# Patient Record
Sex: Female | Born: 1995 | Hispanic: Yes | Smoking: Never smoker
Health system: Southern US, Community
[De-identification: ages and names within clinical notes are randomized; demographics above are authoritative.]

## PROBLEM LIST (undated history)

## (undated) ENCOUNTER — Inpatient Hospital Stay (HOSPITAL_COMMUNITY): Payer: Self-pay

## (undated) DIAGNOSIS — Z789 Other specified health status: Secondary | ICD-10-CM

## (undated) DIAGNOSIS — O34219 Maternal care for unspecified type scar from previous cesarean delivery: Secondary | ICD-10-CM

## (undated) DIAGNOSIS — D649 Anemia, unspecified: Secondary | ICD-10-CM

## (undated) DIAGNOSIS — B1910 Unspecified viral hepatitis B without hepatic coma: Secondary | ICD-10-CM

## (undated) DIAGNOSIS — K760 Fatty (change of) liver, not elsewhere classified: Secondary | ICD-10-CM

## (undated) HISTORY — DX: Maternal care for unspecified type scar from previous cesarean delivery: O34.219

## (undated) HISTORY — PX: ABDOMINAL SURGERY: SHX537

## (undated) HISTORY — DX: Anemia, unspecified: D64.9

---
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
## 2015-01-27 ENCOUNTER — Ambulatory Visit (INDEPENDENT_AMBULATORY_CARE_PROVIDER_SITE_OTHER): Payer: Self-pay | Admitting: Family Medicine

## 2015-01-27 ENCOUNTER — Encounter: Payer: Self-pay | Admitting: Family Medicine

## 2015-01-27 VITALS — BP 121/74 | HR 84 | Temp 98.1°F | Wt 186.5 lb

## 2015-01-27 DIAGNOSIS — K760 Fatty (change of) liver, not elsewhere classified: Secondary | ICD-10-CM

## 2015-01-27 NOTE — Progress Notes (Signed)
Patient ID: Sharon Hess, female   DOB: [birth year], [age] y.o.   MRN: [id number]   [facility] Yolande Jollyaleb G Aava Deland, MD Phone: [phone]  Subjective:   # Fatty Liver follow Up  - pt. States she is here today for follow up after her visit with Dr. Birdie SonsSonnenberg in June of last year to discuss a finding on ultrasound demonstrating fatty liver disease.  - she had been started on a NASH diet at that time.  - She says she has been compliant. She has lost 5 lbs since her last visit.  - She says that she has not been exercising, and that she continues to eat junk food and fast food.  - She drinks lots of carbonated beverages.  - She denies RUQ pain, nausea, vomiting, or periprandial pain / discomfort.  - she denies jaundice, diarrhea, or constipation.  - she does desire to continue to lose weight.  - she would like to continue to work on her diet and exercise.  - She does not have heat or cold intolerance, hair changes, fatigue, or feel that her thyroid is enlarged.  - She does not feel that she is increasing the amount that she has been eating.   All relevant systems were reviewed and were negative unless otherwise noted in the HPI  Past Medical History Reviewed problem list.  Medications- reviewed and updated Current Outpatient Prescriptions  Medication Sig Dispense Refill  . hydrocortisone valerate ointment (WESTCORT) 0.2 % Apply 1 application topically 2 (two) times daily. For 5 days to rash on arms. (Patient not taking: Reported on 12/01/2014) 45 g 0  . ibuprofen (ADVIL,MOTRIN) 600 MG tablet Take 1 tablet (600 mg total) by mouth every 8 (eight) hours as needed. 30 tablet 0  . Prenatal Vit-Fe Fumarate-FA (PRENATAL MULTIVITAMIN) TABS tablet Take 1 tablet by mouth daily. (Patient not taking: Reported on 04/11/2014) 90 tablet 2   No current facility-administered medications for this visit.   Chief complaint-noted No additions to family history Social history-  patient is a non smoker  Objective: BP 121/74 mmHg  Pulse 84  Temp(Src) 98.1 F (36.7 C) (Oral)  Wt 186 lb 8 oz (84.596 kg) Gen: NAD, alert, cooperative with exam HEENT: NCAT, EOMI, PERRL Neck: FROM, supple CV: RRR, good S1/S2, no murmur Resp: CTABL, no wheezes, non-labored Abd: SNTND, BS present, no guarding or organomegaly, no masses palpable, obese abdomen.  Ext: No edema, warm, normal tone, moves UE/LE spontaneously Neuro: Alert and oriented, No gross deficits Skin: no rashes no lesions  Assessment/Plan:  Fatty Liver - LFT's recently cleared. She has lost some weight. It is unclear how significant this is in the setting of pregnancy immediately prior to the liver evaluation. She is obese, but given her age I suspect that her steatosis will improve. She does need to lose weight. She acknowledges this and appears motivated.  - Mostly a counseling visit. - given diet and exercise recommendations.  - return in 6 months for weight evaluation.  - Consider repeat of ultrasound to evaluate the liver in the future.  - discussed at length with the patient.

## 2015-01-27 NOTE — Patient Instructions (Signed)
Thanks for coming in today.   See the diet and exercise recommendations below.   It is very important that you continue to work on losing weight.   Thanks for letting us take care of you.   Sincerely, Sharon Pacealeb Virdie Penning, MD Family Medicine - PGY 2  American Heart Association [facility](AHA) Exercise Recommendation  Being physically active is important to prevent heart disease and stroke, the nation's No. 1and No. 5killers. To improve overall cardiovascular health, we suggest at least 150 minutes per week of moderate exercise or 75 minutes per week of vigorous exercise (or a combination of moderate and vigorous activity). Thirty minutes a day, five times a week is an easy goal to remember. You will also experience benefits even if you divide your time into two or three segments of 10 to 15 minutes per day.  For people who would benefit from lowering their blood pressure or cholesterol, we recommend 40 minutes of aerobic exercise of moderate to vigorous intensity three to four times a week to lower the risk for heart attack and stroke.  Physical activity is anything that makes you move your body and burn calories.  This includes things like climbing stairs or playing sports. Aerobic exercises benefit your heart, and include walking, jogging, swimming or biking. Strength and stretching exercises are best for overall stamina and flexibility.  The simplest, positive change you can make to effectively improve your heart health is to start walking. It's enjoyable, free, easy, social and great exercise. A walking program is flexible and boasts high success rates because people can stick with it. It's easy for walking to become a regular and satisfying part of life.   For Overall Cardiovascular Health:  At least 30 minutes of moderate-intensity aerobic activity at least 5 days per week for a total of 150  OR   At least 25 minutes of vigorous aerobic activity at least 3 days per week for a total of 75  minutes; or a combination of moderate- and vigorous-intensity aerobic activity  AND   Moderate- to high-intensity muscle-strengthening activity at least 2 days per week for additional health benefits.  For Lowering Blood Pressure and Cholesterol  An average 40 minutes of moderate- to vigorous-intensity aerobic activity 3 or 4 times per week  What if I can't make it to the time goal? Something is always better than nothing! And everyone has to start somewhere. Even if you've been sedentary for years, today is the day you can begin to make healthy changes in your life. If you don't think you'll make it for 30 or 40 minutes, set a reachable goal for today. You can work up toward your overall goal by increasing your time as you get stronger. Don't let all-or-nothing thinking rob you of doing what you can every day.  Source:http://www.heart.org  Hacer ejercicio para Psychiatric nursebajar de peso (Exercising to Owens & MinorLose Weight) Hacer ejercicio puede ayudarlo a bajar de peso. Para bajar de Whole Foodspeso mediante el ejercicio, este debe ser de intensidad vigorosa. Puede saber que est haciendo ejercicio de intensidad vigorosa si respira con mucha dificultad y rapidez, y no puede mantener una conversacin. El ejercicio de intensidad moderada ayuda a Radio producermantener el peso actual. Puede saber que est haciendo ejercicio de intensidad moderada si tiene una frecuencia cardaca ms elevada y [place] respiracin ms rpida, pero an puede Diplomatic Services operational officermantener una conversacin. CON QU FRECUENCIA DEBO HACER EJERCICIO? Elija una actividad que disfrute y establezca objetivos realistas. El mdico puede ayudarlo a Event organiserelaborar un plan de actividades que funcione  para usted. Haga ejercicio regularmente como se lo haya indicado el mdico. Esta puede incluir:  Programme researcher, broadcasting/film/video de resistencia dos veces por semana, como:  Flexiones de [place].  Abdominales.  Levantamiento de pesas.  Ejercicios con bandas elsticas.  Realizar una intensidad determinada de  ejercicio durante una cantidad determinada de [facility]. Elija entre estas opciones:  de ejercicio de intensidad moderada cada semana.  de ejercicio de intensidad vigorosa cada semana.  Burlene Arnt de ejercicio de intensidad moderada y vigorosa cada semana. Los nios, las mujeres Elliott, las personas que no estn en forma, las personas con sobrepeso y los adultos mayores tal vez tengan que consultar a un mdico para que les d Medical laboratory scientific officer. Si tiene [employer], asegrese de Science writer al mdico antes de comenzar un programa de ejercicios nuevo. CULES SON ALGUNAS ACTIVIDADES QUE PUEDEN AYUDARME A BAJAR DE PESO?   Caminar a un ritmo de al menos 4,51millas (7kilmetros) por hora.  Trotar o correr a un ritmo de 5 millas (8 kilmetros) por hora.  Andar en bicicleta a un ritmo de al menos 10 millas (16 kilmetros) por hora.  Practicar natacin.  Practicar patinaje sobre ruedas normales o en lnea.  Hacer esqu de fondo.  Hacer deportes competitivos vigorosos, como ftbol americano, bsquet y ftbol.  Saltar la soga.  Tomar clases de baile aerbico. CMO PUEDO SER MS ACTIVO EN MIS ACTIVIDADES DIARIAS?  Utilice las Microbiologist del ascensor.  D una caminata durante su hora de almuerzo.  Si conduce, estacione el automvil ms lejos del trabajo o de la escuela.  Si [place] transporte pblico, bjese una parada antes y camine el resto del camino.  Pngase de pie y camine cada vez que haga llamadas telefnicas.  Levntese, estrese y camine cada a lo largo del Futures trader. QU PAUTAS DEBO SEGUIR MIENTRAS HAGO EJERCICIO?  No haga ejercicio en exceso que pudiera hacer que se lastime, se sienta mareado o tenga dificultad para respirar.  Consulte al mdico antes de comenzar un programa de ejercicios nuevo.  Use ropa cmoda y calzado con buen soporte.  Beba gran cantidad de agua mientras hace ejercicios para evitar la  deshidratacin o los golpes de Airline pilot. Durante la actividad fsica se pierde agua corporal que se debe reponer.  Haga ejercicio hasta que se acelere su respiracin y sus latidos cardacos.   Esta informacin no tiene Theme park manager el consejo del mdico. Asegrese de hacerle al mdico cualquier pregunta que tenga.   Document Released: 04/15/2010 Document Revised: 01/30/2014 Elsevier Interactive Patient Education 2016 [employer].   Dieta para la enfermedad del hgado graso no relacionada con el alcohol (Nonalcoholic Fatty Liver Disease Diet) La enfermedad del hgado graso no relacionada con el alcohol causa la acumulacin de grasa en el hgado y alrededor de este rgano. Esta afeccin impide al hgado funcionar correctamente. Seguir una dieta saludable puede ayudar a Pharmacologist la enfermedad del hgado graso no relacionada con el alcohol bajo control y, Insurance account manager, a Automotive engineer o mejorar los cuadros clnicos asociados con la enfermedad, como las cardiopatas, la diabetes, la hipertensin arterial y las concentraciones anormales de colesterol. Junto con la actividad fsica habitual, esta dieta:  Promueve la prdida de Bee.  Ayuda a Medical sales representative de Banker.  Ayuda a mejorar la forma en que el organismo [place] la insulina. QU DEBO SABER ACERCA DE ESTA DIETA?  Use el ndice glucmico (IG) para planificar las comidas. El ndice le informa con qu rapidez un alimento elevar  su nivel de azcar en la sangre. Elija los alimentos con bajo IG. Estos tardan ms tiempo en subir el nivel de azcar en la sangre.  Lleve un registro de la cantidad de caloras que ingiere. Ingerir la cantidad correcta de caloras lo ayudar a Cabin crew peso saludable.  Tal vez deba seguir Clinical cytogeneticist. Esta incluye una gran cantidad de verduras, carnes magras o pescado, cereales integrales, frutas, as como aceites y grasas saludables. QU ALIMENTOS PUEDO COMER? Cereales Cereales integrales,  como los panes de salvado o integrales, las [place], las tortillas, los cereales y las pastas. Salvado molido. Pan integral de centeno. Avena sin azcar. Trigo burgol. Cebada. Quinua. Arroz integral o salvaje. Tortillas de harina de maz o de salvado. Hoover Brunette Deatra James. Espinaca. Guisantes. Remolachas. Coliflor. Repollo. Brcoli. Zanahorias. Tomates. [place]. Christella Noa. Hierbas. Pimientos. Cebollas. Pepinos. Repollitos de Bruselas. ames y batatas. Frijoles. Lentejas. Nils Pyle Bananas. Manzanas. Naranjas. Uvas. Papaya. Mango. Gran[place]. Kiwi. Pomelo. Cerezas. Carnes y otras fuentes de protenas Frutos de mar y Oceanographer. Carnes magras. Aves. Tofu. Lcteos Productos lcteos descremados o semidescremados, como yogur, queso cottage y [place]. [employer]. Bebidas sin azcar. T. Caf. Leche descremada o semidescremada. Productos alternativos de la [place], [address] de soja o de [place]. Jugo de frutas naturales. Condimentos [place]aza. Salsa de pepinillos. Ktchup y salsa barbacoa con bajo contenido de grasa y de International aid/development worker. Mayonesa sin grasa o con bajo contenido de [place]. Dulces y postres Dulces sin azcar. Grasas y Arts development officer. Aceite de canola o de oliva. Nueces y [place] de frutos secos. Semillas. Los artculos mencionados arriba pueden no ser [employer] de las bebidas o los alimentos recomendados. Comunquese con el nutricionista para conocer ms opciones. QU ALIMENTOS NO SE RECOMIENDAN? Aceite de palma y de coco. Alimentos procesados. Comidas fritas. Bebidas azucaradas, como t [place], batidos con [place], [place], bebidas dulces heladas y [place]. Alcohol. Dulces. Alimentos con alto contenido de sal o de sodio. Los artculos mencionados arriba pueden no ser [employer] de las bebidas y los alimentos que se Theatre stage manager. Comunquese con el nutricionista para obtener ms informacin.   Esta informacin no tiene Theme park manager el consejo del mdico. Asegrese de hacerle  al mdico cualquier pregunta que tenga.   Document Released: 09/30/2014 Elsevier Interactive Patient Education [employer]

## 2015-05-23 ENCOUNTER — Encounter (HOSPITAL_COMMUNITY): Payer: Self-pay | Admitting: *Deleted

## 2015-05-23 ENCOUNTER — Emergency Department (HOSPITAL_COMMUNITY)
Admission: EM | Admit: 2015-05-23 | Discharge: 2015-05-23 | Disposition: A | Discharge status: Home / Self Care | Payer: Self-pay | Attending: Emergency Medicine | Admitting: Emergency Medicine

## 2015-05-23 DIAGNOSIS — J302 Other seasonal allergic rhinitis: Secondary | ICD-10-CM | POA: Insufficient documentation

## 2015-05-23 DIAGNOSIS — H578 Other specified disorders of eye and adnexa: Secondary | ICD-10-CM | POA: Insufficient documentation

## 2015-05-23 DIAGNOSIS — M791 Myalgia: Secondary | ICD-10-CM | POA: Insufficient documentation

## 2015-05-23 DIAGNOSIS — R109 Unspecified abdominal pain: Secondary | ICD-10-CM | POA: Insufficient documentation

## 2015-05-23 DIAGNOSIS — H6122 Impacted cerumen, left ear: Secondary | ICD-10-CM | POA: Insufficient documentation

## 2015-05-23 DIAGNOSIS — R110 Nausea: Secondary | ICD-10-CM | POA: Insufficient documentation

## 2015-05-23 NOTE — ED Provider Notes (Signed)
CSN: [id number]     Arrival date & time 05/23/15  1246 History   First MD Initiated Contact with Patient 05/23/15 1738     Chief Complaint  Patient presents with  . Otalgia  . Headache     (Consider location/radiation/quality/duration/timing/severity/associated sxs/prior Treatment) Patient is a 20 y.o. female presenting with ear pain and headaches. A language interpreter was used.  Otalgia Associated symptoms: headaches   Headache Associated symptoms: ear pain    Sharon Hess is a 20 y.o. female who presents for evaluation of red eyes, itchy nose, sore throat, ear itching, and myalgia. She has also had some nausea and abdominal pain. Symptoms are subacute , over the last month. She is using Benadryl for allergies, without relief. She last had a Depo-Provera injection, one month ago. She has been amenorrheic for one year, since being on the Depo-Provera. There are no other known modifying factors.   Past Medical History  Diagnosis Date  . No pertinent past medical history    Past Surgical History  Procedure Laterality Date  . Abdominal surgery      c-section  . Cesarean section    . Cesarean section  08/21/2011    Procedure: CESAREAN SECTION;  Surgeon: Allie BossierMyra C Dove, MD;  Location: [facility];  Service: Gynecology;  Laterality: N/A;  Primary cesarean section of baby girl  at 0644  APGAR 9/9   Family History  Problem Relation Age of Onset  . Alcohol abuse Neg Hx   . Arthritis Neg Hx   . Asthma Neg Hx   . Birth defects Neg Hx   . Cancer Neg Hx   . COPD Neg Hx   . Depression Neg Hx   . Diabetes Neg Hx   . Drug abuse Neg Hx   . Early death Neg Hx   . Hearing loss Neg Hx   . Heart disease Neg Hx   . Hyperlipidemia Neg Hx   . Hypertension Neg Hx   . Kidney disease Neg Hx   . Learning disabilities Neg Hx   . Mental illness Neg Hx   . Mental retardation Neg Hx   . Miscarriages / Stillbirths Neg Hx   . Stroke Neg Hx   . Vision loss Neg Hx    Social History   Substance Use Topics  . Smoking status: Never Smoker   . Smokeless tobacco: Never Used  . Alcohol Use: No   OB History    Gravida Para Term Preterm AB TAB SAB Ectopic Multiple Living   4 3 2  0 0 0 0 0 1 2     Review of Systems  HENT: Positive for ear pain.   Neurological: Positive for headaches.  All other systems reviewed and are negative.     Allergies  Review of patient's allergies indicates no known allergies.  Home Medications   Prior to Admission medications   Medication Sig Start Date End Date Taking? Authorizing Provider  hydrocortisone valerate ointment (WESTCORT) 0.2 % Apply 1 application topically 2 (two) times daily. For 5 days to rash on arms. Patient not taking: Reported on 12/01/2014 06/10/14   Glori LuisEric G Sonnenberg, MD  ibuprofen (ADVIL,MOTRIN) 600 MG tablet Take 1 tablet (600 mg total) by mouth every 8 (eight) hours as needed. 12/02/14   Smitty CordsAlexander J Karamalegos, DO  Prenatal Vit-Fe Fumarate-FA (PRENATAL MULTIVITAMIN) TABS tablet Take 1 tablet by mouth daily. Patient not taking: Reported on 04/11/2014 08/25/13   Glori LuisEric G Sonnenberg, MD   BP 100/58 mmHg  Pulse 74  Temp(Src) 98.6 F (37 C) (Oral)  Resp 18  SpO2 100% Physical Exam  Constitutional: She is oriented to person, place, and time. She appears well-developed and well-nourished. No distress.  HENT:  Head: Normocephalic and atraumatic.  Right Ear: External ear normal.  Left Ear: External ear normal.  Eyes: Conjunctivae and EOM are normal. Pupils are equal, round, and reactive to light.  Mild conjunctival injection bilaterally. No associated drainage.  Neck: Normal range of motion and phonation normal. Neck supple.  Cardiovascular: Normal rate, regular rhythm and normal heart sounds.   Pulmonary/Chest: Effort normal and breath sounds normal. She exhibits no bony tenderness.  Abdominal: Soft. She exhibits no distension. There is no tenderness. There is no guarding.  Musculoskeletal: Normal range of motion.   Neurological: She is alert and oriented to person, place, and time. No cranial nerve deficit or sensory deficit. She exhibits normal muscle tone. Coordination normal.  Skin: Skin is warm, dry and intact.  Psychiatric: She has a normal mood and affect. Her behavior is normal. Judgment and thought content normal.  Nursing note and vitals reviewed.   ED Course  Procedures (including critical care time)  Medications - No data to display  Patient Vitals for the past 24 hrs:  BP Temp Temp src Pulse Resp SpO2  05/23/15 1900 100/58 mmHg - - 74 - 100 %  05/23/15 1432 - 98.6 F (37 C) Oral - - -  05/23/15 1319 119/73 mmHg - - 87 18 97 %    7:17 PM Reevaluation with update and discussion. After initial assessment and treatment, an updated evaluation reveals No change in clinical status. Findings discussed with patient, and discharge instructions given verbally with the help of the Spanish language translatorWENTZ,Detrell Umscheid L    Labs Review Labs Reviewed - No data to display  Imaging Review No results found. I have personally reviewed and evaluated these images and lab results as part of my medical decision-making.   EKG Interpretation None      MDM   Final diagnoses:  Other seasonal allergic rhinitis  Cerumen impaction, left    Evaluation consistent with seasonal allergy. Incidental note of left cerumen impaction. Doubt serious bacterial infection, metabolic instability or impending vascular collapse.  Nursing Notes Reviewed/ Care Coordinated Applicable Imaging Reviewed Interpretation of Laboratory Data incorporated into ED treatment  The patient appears reasonably screened and/or stabilized for discharge and I doubt any other medical condition or other [facility] requiring further screening, evaluation, or treatment in the ED at this time prior to discharge.  Plan: Home Medications- Zyrtec; Home Treatments- rinse ears with peroxide and water; return here if the recommended treatment,  does not improve the symptoms; Recommended follow up- PCP prn    Mancel Bale, MD 05/23/15 1919

## 2015-05-23 NOTE — ED Notes (Signed)
Used interpretor phone, pt has multiple complaints. Reports having fever, abd pain, headache, nausea and burning ear pain for months. Denies vomiting or diarrhea. No distress noted at triage.

## 2015-05-23 NOTE — Discharge Instructions (Signed)
Use Zyrtec, antihistamine, once a day as needed for allergy symptoms. For the earwax problem on the left. Use a mixture of peroxide and water, to rinse out the ear. You'll probably need to do this, daily, for 4 or 5 days.     Rinitis alrgica (Allergic Rhinitis) La rinitis alrgica ocurre cuando las membranas mucosas de la nariz responden a los alrgenos. Los alrgenos son las partculas que estn en el aire y que hacen que el cuerpo tenga una reaccin Counselling psychologist. Esto hace que usted libere anticuerpos alrgicos. A travs de una cadena de eventos, estos finalmente hacen que usted libere histamina en la corriente sangunea. Aunque la funcin de la histamina es proteger al organismo, es esta liberacin de histamina lo que provoca malestar, como los estornudos frecuentes, la congestin y goteo y Control and instrumentation engineer.  CAUSAS La causa de la rinitis Merchandiser, retail (fiebre del heno) son los alrgenos del polen que pueden provenir del csped, los rboles y Theme park manager. La causa de la rinitis IT consultant (rinitis alrgica perenne) son los alrgenos, como los caros del polvo domstico, la caspa de las mascotas y las esporas del moho. SNTOMAS  Secrecin nasal (congestin).  Goteo y picazn nasales con estornudos y Arboriculturist. DIAGNSTICO Su mdico puede ayudarlo a Warehouse manager alrgeno o los alrgenos que desencadenan sus sntomas. Si usted y su mdico no pueden Chief Strategy Officer cul es el alrgeno, pueden hacerse anlisis de sangre o estudios de la piel. El mdico diagnosticar la afeccin despus de hacerle una historia clnica y un examen fsico. Adems, puede evaluarlo para detectar la presencia de otras enfermedades afines, como asma, conjuntivitis u otitis. TRATAMIENTO La rinitis alrgica no tiene [place], pero puede controlarse con lo siguiente:  Medicamentos que [employer] sntomas de [place], por ejemplo, vacunas contra la [place], aerosoles nasales y antihistamnicos por va oral.  Evitar el  alrgeno. La fiebre del heno a menudo puede tratarse con antihistamnicos en las formas de pldoras o aerosol nasal. Los antihistamnicos bloquean los efectos de la histamina. Existen medicamentos de venta libre que pueden ayudar con la congestin nasal y la hinchazn alrededor de los ojos. Consulte a su mdico antes de tomar o administrarse este medicamento. Si la prevencin del alrgeno o el medicamento recetado no dan resultado, existen muchos medicamentos nuevos que su mdico puede recetarle. Pueden usarse medicamentos ms fuertes si las medidas iniciales no son efectivas. Pueden aplicarse inyecciones desensibilizantes si los medicamentos y la prevencin no funcionan. La desensibilizacin ocurre cuando un paciente recibe vacunas constantes hasta que el cuerpo se vuelve menos sensible al alrgeno. Asegrese de Medical sales representative seguimiento con su mdico si los problemas continan. INSTRUCCIONES PARA EL CUIDADO EN EL HOGAR No es posible evitar por completo los alrgenos, pero puede reducir los sntomas al tomar medidas para limitar su exposicin a ellos. Es muy til saber exactamente a qu es alrgico para que pueda evitar sus desencadenantes especficos. SOLICITE ATENCIN MDICA SI:  Lance Muss.  Desarrolla una tos que no cesa fcilmente (persistente).  Le falta el aire.  Comienza a tener sibilancias.  Los sntomas interfieren con las actividades diarias normales.   Esta informacin no tiene Theme park manager el consejo del mdico. Asegrese de hacerle al mdico cualquier pregunta que tenga.   Document Released: 10/19/2004 Document Revised: 01/30/2014 Elsevier Interactive Patient Education 2016 [employer].  Tapn de cerumen (Cerumen Impaction) Las estructuras del canal auditivo externo secretan una sustancia cerosa llamada cerumen. El exceso de cerumen puede acumularse en el canal auditivo y causar una afeccin conocida  como tapn de cerumen. El tapn de cerumen puede producir dolor de  odo y Media planneralterar el funcionamiento de este rgano. La velocidad de produccin del cerumen es diferente en cada persona. En algunas, la estructura del canal auditivo puede reducir su capacidad de eliminar el cerumen de forma natural. CAUSAS La causa del tapn de cerumen es el exceso de produccin o la acumulacin de esta secrecin. FACTORES DE RIESGO  Usar hisopos frecuentemente para limpiarse los odos.  Tener los canales TEPPCO Partnersauditivos estrechos.  Tener eccema.  Estar deshidratado. SIGNOS Y SNTOMAS  Disminucin de la audicin.  Secrecin del odo.  Dolor de odo.  Picazn en el odo. TRATAMIENTO El tratamiento puede incluir lo siguiente:  Gotas ticas de venta libre o recetadas para ablandar el cerumen.  Extraccin del cerumen a cargo de Games developerun mdico. Esto se puede hacer de las siguientes maneras:  Lavado con agua tibia. Este es el mtodo de extraccin ms comn.  Cucharillas de raspado y otros instrumentos para el odo.  Ciruga. Esta puede realizarse Circuit Citycuando los casos son graves. INSTRUCCIONES PARA EL CUIDADO EN EL HOGAR  Tome los medicamentos solamente como se lo haya indicado el mdico.  No se introduzca objetos en el odo con la intencin de limpiarlo. PREVENCIN  No se introduzca objetos en el odo, aunque sea con la intencin de limpiarlo. No es necesario quitar el cerumen como parte de la higiene normal, y no se recomienda el uso de hisopos en el canal auditivo.  Beba suficiente agua para mantener la orina clara o de color amarillo plido.  Controle el eccema, si lo tiene. SOLICITE ATENCIN MDICA SI:  Tiene dolor de odo.  Le sangra el odo.  El cerumen no se elimina despus de colocarse gotas ticas como se lo indicaron.   Esta informacin no tiene Theme park managercomo fin reemplazar el consejo del mdico. Asegrese de hacerle al mdico cualquier pregunta que tenga.   Document Released: 01/09/2005 Document Revised: 01/30/2014 Elsevier Interactive Patient Education [id number]2016  Elsevier Inc.

## 2015-08-09 ENCOUNTER — Encounter: Payer: Self-pay | Admitting: Student

## 2015-08-09 ENCOUNTER — Ambulatory Visit (INDEPENDENT_AMBULATORY_CARE_PROVIDER_SITE_OTHER): Payer: Self-pay | Admitting: Student

## 2015-08-09 VITALS — BP 118/64 | HR 82 | Temp 98.3°F | Wt 190.0 lb

## 2015-08-09 DIAGNOSIS — Z3049 Encounter for surveillance of other contraceptives: Secondary | ICD-10-CM

## 2015-08-09 DIAGNOSIS — K760 Fatty (change of) liver, not elsewhere classified: Secondary | ICD-10-CM

## 2015-08-09 DIAGNOSIS — IMO0001 Reserved for inherently not codable concepts without codable children: Secondary | ICD-10-CM | POA: Insufficient documentation

## 2015-08-09 LAB — POCT URINE PREGNANCY: Preg Test, Ur: NEGATIVE

## 2015-08-09 NOTE — Patient Instructions (Signed)
Follow-up in 2 months for contraception If you have questions or concerns please call the office at [phone]

## 2015-08-09 NOTE — Assessment & Plan Note (Signed)
Will give letter for Depo-Provera demonstration without department. Febrile likely not her best option for birth control given the fact that she is trying to lose weight - LARC contraceptive methods discussed - She is still breast-feeding so Mirena would likely be her best option -  she will be applying for insurance with our clinic in a week. Will continue to discuss LARC

## 2015-08-09 NOTE — Assessment & Plan Note (Signed)
Consider CMP to assess stability of LFTs at next visit when she has insurance

## 2015-08-09 NOTE — Progress Notes (Signed)
   Subjective:    Patient ID: Sharon Hess, female    DOB: [birth year], 20 y.o.   MRN: [id number]   WU:[id number]:Letter for Depo-Provera  HPI: 20 year old female with history of fatty liver disease, obesity presenting for blood of her Depo-Provera  Depo provera - Has been getting this from the Health department since the delivery of her daughter in 03/2014 - She was diagnosed with fatty liver in 2016 with abnormal LFTs, which have subsequently resolved - she is obese and has gained weight ( 4 lbs) from her last visit in 01/2015 when she was counseled to lose weight - she does not have insurance but plans to apply for aid next week - the Health department has asked for a letter stating that she can still get Depo provera    Smoking status reviewed  Review of Systems  Per HPI, else denies recent illness, fever, headache, changes in vision, chest pain, shortness of breath, abdominal pain, N/V/D, weakness    Objective:  BP 118/64 mmHg  Pulse 82  Temp(Src) 98.3 F (36.8 C) (Oral)  Wt 190 lb (86.183 kg) Vitals and nursing note reviewed  General: NAD Cardiac: RRR Respiratory: CTAB, normal effort Abdomen: obesesoft, nontender, non-distended Skin: warm and dry Neuro: alert and oriented   Assessment & Plan:    Contraception Will give letter for Depo-Provera demonstration without department. Febrile likely not her best option for birth control given the fact that she is trying to lose weight - LARC contraceptive methods discussed - She is still breast-feeding so Mirena would likely be her best option -  she will be applying for insurance with our clinic in a week. Will continue to discuss LARC   Fatty liver disease, nonalcoholic Consider CMP to assess stability of LFTs at next visit when she has insurance     Renleigh Ouellet A. Kennon RoundsHaney MD, MS Family Medicine Resident PGY-2 Pager [phone]

## 2015-09-21 ENCOUNTER — Ambulatory Visit: Payer: Self-pay | Admitting: Family Medicine

## 2015-12-15 ENCOUNTER — Encounter (HOSPITAL_COMMUNITY): Payer: Self-pay | Admitting: *Deleted

## 2015-12-15 DIAGNOSIS — R51 Headache: Secondary | ICD-10-CM | POA: Insufficient documentation

## 2015-12-15 DIAGNOSIS — L089 Local infection of the skin and subcutaneous tissue, unspecified: Secondary | ICD-10-CM | POA: Insufficient documentation

## 2015-12-15 NOTE — ED Triage Notes (Signed)
The pt is having headache for 5 hours and she has bummps on her back also.  She speaks little english  lmp march she has had a baby and is still  Breast feeding

## 2015-12-16 ENCOUNTER — Emergency Department (HOSPITAL_COMMUNITY)
Admission: EM | Admit: 2015-12-16 | Discharge: 2015-12-16 | Disposition: A | Discharge status: Home / Self Care | Payer: Self-pay | Attending: Emergency Medicine | Admitting: Emergency Medicine

## 2015-12-16 DIAGNOSIS — L089 Local infection of the skin and subcutaneous tissue, unspecified: Secondary | ICD-10-CM

## 2015-12-16 DIAGNOSIS — R519 Headache, unspecified: Secondary | ICD-10-CM

## 2015-12-16 DIAGNOSIS — R51 Headache: Secondary | ICD-10-CM

## 2015-12-16 MED ORDER — METOCLOPRAMIDE HCL 5 MG/ML IJ SOLN: 10.0000 mg | Freq: Once | INTRAMUSCULAR | Status: DC

## 2015-12-16 MED ORDER — SULFAMETHOXAZOLE-TRIMETHOPRIM 800-160 MG PO TABS: 1.0000 | ORAL_TABLET | Freq: Two times a day (BID) | ORAL | 0 refills | Status: AC

## 2015-12-16 MED ORDER — SODIUM CHLORIDE 0.9 % IV BOLUS (SEPSIS): 1000.0000 mL | Freq: Once | INTRAVENOUS | Status: DC

## 2015-12-16 MED ORDER — DIPHENHYDRAMINE HCL 50 MG/ML IJ SOLN: 25.0000 mg | Freq: Once | INTRAMUSCULAR | Status: DC

## 2015-12-16 NOTE — ED Notes (Signed)
Per translator, the pt has headaches about once a month, pt denies lightheaded or dizziness but says it does affect her vision. Headache is on her left temporal lobe and is pounding. Light and sound make the headache worse. Pt also has blisters right above her buttocks and have been there for the last 5 days. Pt says she does not know how the blisters got there. Pt mentioned after the PA left the room that she sometimes has numbness bilaterally on her arms and legs. It comes and goes and does not currently have numbness.  Pt also complains of upper back pain and would like to know if this is connected.

## 2015-12-16 NOTE — ED Notes (Signed)
Pt verbalized understanding discharge instructions and denies any further needs or questions at this time. VS stable, ambulatory and steady gait.   

## 2015-12-16 NOTE — ED Notes (Signed)
Pt just told this RN that she needs to go home b/c noone can watch her kids. Pt denies having a headache anymore and just wants some medicine for her back

## 2015-12-16 NOTE — ED Provider Notes (Signed)
[facility] Provider Note   CSN: [id number] Arrival date & time: 12/15/15  2313     History   Chief Complaint Chief Complaint  Patient presents with  . Headache    HPI Sharon Hess is a 20 y.o. female.  HPI  Interview done with professional video interpretor. Patient to the ER complaining of headache- happens once a month, 8/10, associated phonophobia and photophobia. Throbbing to the left temporal and parietal region. No change in vision/numbness or weakness. She also has blisters on back that are painful and have been there for 1 week she woud like to be seen for.   No fevers, n/v/d, weakness, confusion, neck pain, sore throat, ear pain, dental pain, CP, SOB, coughing, dysuria.  Past Medical History:  Diagnosis Date  . No pertinent past medical history     Patient Active Problem List   Diagnosis Date Noted  . Contraception 08/09/2015  . Acute mastitis of right breast 12/01/2014  . Fever 12/01/2014  . Low back pain 12/01/2014  . SIRS (systemic inflammatory response syndrome) (HCC)   . Mastitis in female   . Fatty liver disease, nonalcoholic 07/13/2014  . Status post vaginal delivery 06/10/2014  . Previous cesarean delivery, antepartum condition or complication 02/16/2014  . Sacral back pain 01/29/2014  . Supervision of other normal pregnancy 12/16/2013  . Scar pain 05/09/2013  . Pain, dental 05/09/2013    Past Surgical History:  Procedure Laterality Date  . ABDOMINAL SURGERY     c-section  . CESAREAN SECTION    . CESAREAN SECTION  08/21/2011   Procedure: CESAREAN SECTION;  Surgeon: Allie Bossier, MD;  Location: [facility];  Service: Gynecology;  Laterality: N/A;  Primary cesarean section of baby girl  at 0644  APGAR 9/9    OB History    Gravida Para Term Preterm AB Living   4 3 2  0 0 2   SAB TAB Ectopic Multiple Live Births   0 0 0 1 2       Home Medications    Prior to Admission medications   Medication Sig Start Date End Date  Taking? Authorizing Provider  hydrocortisone valerate ointment (WESTCORT) 0.2 % Apply 1 application topically 2 (two) times daily. For 5 days to rash on arms. Patient not taking: Reported on 12/01/2014 06/10/14   Glori Luis, MD  ibuprofen (ADVIL,MOTRIN) 600 MG tablet Take 1 tablet (600 mg total) by mouth every 8 (eight) hours as needed. 12/02/14   Smitty Cords, DO  Prenatal Vit-Fe Fumarate-FA (PRENATAL MULTIVITAMIN) TABS tablet Take 1 tablet by mouth daily. Patient not taking: Reported on 04/11/2014 08/25/13   Glori Luis, MD  sulfamethoxazole-trimethoprim (BACTRIM DS,SEPTRA DS) 800-160 MG tablet Take 1 tablet by mouth 2 (two) times daily. 12/16/15 12/23/15  Marlon Pel, PA-C    Family History Family History  Problem Relation Age of Onset  . Alcohol abuse Neg Hx   . Arthritis Neg Hx   . Asthma Neg Hx   . Birth defects Neg Hx   . Cancer Neg Hx   . COPD Neg Hx   . Depression Neg Hx   . Diabetes Neg Hx   . Drug abuse Neg Hx   . Early death Neg Hx   . Hearing loss Neg Hx   . Heart disease Neg Hx   . Hyperlipidemia Neg Hx   . Hypertension Neg Hx   . Kidney disease Neg Hx   . Learning disabilities Neg Hx   . Mental  illness Neg Hx   . Mental retardation Neg Hx   . Miscarriages / Stillbirths Neg Hx   . Stroke Neg Hx   . Vision loss Neg Hx     Social History Social History  Substance Use Topics  . Smoking status: Never Smoker  . Smokeless tobacco: Never Used  . Alcohol use No     Allergies   Patient has no known allergies.   Review of Systems Review of Systems Review of Systems All other systems negative except as documented in the HPI. All pertinent positives and negatives as reviewed in the HPI.   Physical Exam Updated Vital Signs BP 122/62 (BP Location: Right Arm)   Pulse 99   Temp 98.4 F (36.9 C) (Oral)   Resp 18   SpO2 100%   Physical Exam  Constitutional: She appears well-developed and well-nourished. No distress.  HENT:  Head:  Normocephalic and atraumatic.  Right Ear: Tympanic membrane and ear canal normal.  Left Ear: Tympanic membrane and ear canal normal.  Nose: Nose normal.  Mouth/Throat: Uvula is midline, oropharynx is clear and moist and mucous membranes are normal.  Eyes: Pupils are equal, round, and reactive to light.  Neck: Normal range of motion. Neck supple.  Cardiovascular: Normal rate and regular rhythm.   Pulmonary/Chest: Effort normal.  Abdominal: Soft.  No signs of abdominal distention  Musculoskeletal:       Arms: No LE swelling Two pustules to lower back  Neurological: She is alert.  Acting at baseline Cranial nerves grossly intact on exam. Pt alert and oriented x 3 Upper and lower extremity strength is symmetrical and physiologic Normal muscular tone No facial droop Coordination intact, no limb ataxia,No pronator drift  Skin: Skin is warm and dry. No rash noted.  Nursing note and vitals reviewed.     ED Treatments / Results  Labs (all labs ordered are listed, but only abnormal results are displayed) Labs Reviewed  I-STAT BETA HCG BLOOD, ED ([facility], [facility], [facility] ONLY)    EKG  EKG Interpretation None       Radiology No results found.  Procedures Procedures (including critical care time)  Medications Ordered in ED Medications  sodium chloride 0.9 % bolus 1,000 mL (not administered)  diphenhydrAMINE (BENADRYL) injection 25 mg (not administered)  metoCLOPramide (REGLAN) injection 10 mg (not administered)     Initial Impression / Assessment and Plan / ED Course  I have reviewed the triage vital signs and the nursing notes.  Pertinent labs & imaging results that were available during my care of the patient were reviewed by me and considered in my medical decision making (see chart for details).  Clinical Course     Before starting IV pt decided that she does not any longer want to be seen for her back because the baby sitter fell through she wants an abx for her back and  for discharge. Patient re-iterates through interpreter that she does not want to be seen for headache. Pt alert x oriented and capable of making this decision.  --- no meds given in ED  Medications  sodium chloride 0.9 % bolus 1,000 mL (not administered)  diphenhydrAMINE (BENADRYL) injection 25 mg (not administered)  metoCLOPramide (REGLAN) injection 10 mg (not administered)    I discussed results, diagnoses and plan with [name]. They voice there understanding and questions were answered. We discussed follow-up recommendations and return precautions.   Final Clinical Impressions(s) / ED Diagnoses   Final diagnoses:  Nonintractable headache, unspecified  chronicity pattern, unspecified headache type  Skin infection    New Prescriptions New Prescriptions   SULFAMETHOXAZOLE-TRIMETHOPRIM (BACTRIM DS,SEPTRA DS) 800-160 MG TABLET    Take 1 tablet by mouth 2 (two) times daily.     Marlon Peliffany Chastin Garlitz, PA-C 12/16/15 [id number]    Glynn OctaveStephen Rancour, MD 12/16/15 [phone]

## 2015-12-16 NOTE — ED Notes (Signed)
Nurse starting IV will draw labs. 

## 2015-12-30 ENCOUNTER — Other Ambulatory Visit: Payer: Self-pay | Admitting: Family Medicine

## 2016-01-03 ENCOUNTER — Ambulatory Visit: Payer: Self-pay | Attending: Internal Medicine

## 2016-01-12 ENCOUNTER — Ambulatory Visit: Payer: Self-pay | Attending: Internal Medicine

## 2016-05-25 ENCOUNTER — Emergency Department (HOSPITAL_COMMUNITY): Payer: Self-pay

## 2016-05-25 ENCOUNTER — Encounter (HOSPITAL_COMMUNITY): Payer: Self-pay

## 2016-05-25 ENCOUNTER — Emergency Department (HOSPITAL_COMMUNITY)
Admission: EM | Admit: 2016-05-25 | Discharge: 2016-05-25 | Disposition: A | Discharge status: Home / Self Care | Payer: Self-pay | Attending: Emergency Medicine | Admitting: Emergency Medicine

## 2016-05-25 DIAGNOSIS — H1089 Other conjunctivitis: Secondary | ICD-10-CM | POA: Insufficient documentation

## 2016-05-25 DIAGNOSIS — N898 Other specified noninflammatory disorders of vagina: Secondary | ICD-10-CM | POA: Insufficient documentation

## 2016-05-25 DIAGNOSIS — R102 Pelvic and perineal pain: Secondary | ICD-10-CM | POA: Insufficient documentation

## 2016-05-25 LAB — CBC WITH DIFFERENTIAL/PLATELET
BASOS PCT: 0 %
Basophils Absolute: 0 10*3/uL (ref 0.0–0.1)
EOS PCT: 6 %
Eosinophils Absolute: 0.5 10*3/uL (ref 0.0–0.7)
HEMATOCRIT: 38.9 % (ref 36.0–46.0)
Hemoglobin: 13.2 g/dL (ref 12.0–15.0)
LYMPHS PCT: 35 %
Lymphs Abs: 2.8 10*3/uL (ref 0.7–4.0)
MCH: 29.5 pg (ref 26.0–34.0)
MCHC: 33.9 g/dL (ref 30.0–36.0)
MCV: 86.8 fL (ref 78.0–100.0)
MONO ABS: 0.4 10*3/uL (ref 0.1–1.0)
MONOS PCT: 5 %
NEUTROS ABS: 4.5 10*3/uL (ref 1.7–7.7)
Neutrophils Relative %: 54 %
Platelets: 277 10*3/uL (ref 150–400)
RBC: 4.48 MIL/uL (ref 3.87–5.11)
RDW: 13.3 % (ref 11.5–15.5)
WBC: 8.2 10*3/uL (ref 4.0–10.5)

## 2016-05-25 LAB — URINALYSIS, ROUTINE W REFLEX MICROSCOPIC
BACTERIA UA: NONE SEEN
Bilirubin Urine: NEGATIVE
Glucose, UA: NEGATIVE mg/dL
Ketones, ur: NEGATIVE mg/dL
Leukocytes, UA: NEGATIVE
Nitrite: NEGATIVE
Protein, ur: NEGATIVE mg/dL
SPECIFIC GRAVITY, URINE: 1.016 (ref 1.005–1.030)
pH: 6 (ref 5.0–8.0)

## 2016-05-25 LAB — BASIC METABOLIC PANEL
Anion gap: 6 (ref 5–15)
BUN: 10 mg/dL (ref 6–20)
CALCIUM: 9.2 mg/dL (ref 8.9–10.3)
CO2: 23 mmol/L (ref 22–32)
CREATININE: 0.54 mg/dL (ref 0.44–1.00)
Chloride: 110 mmol/L (ref 101–111)
GFR calc Af Amer: >60 mL/min (ref >60)
GFR calc non Af Amer: >60 mL/min (ref >60)
GLUCOSE: 123 mg/dL — AB (ref 65–99)
Potassium: 3.7 mmol/L (ref 3.5–5.1)
Sodium: 139 mmol/L (ref 135–145)

## 2016-05-25 LAB — WET PREP, GENITAL
Clue Cells Wet Prep HPF POC: NONE SEEN
SPERM: NONE SEEN
TRICH WET PREP: NONE SEEN
YEAST WET PREP: NONE SEEN

## 2016-05-25 LAB — POC URINE PREG, ED: PREG TEST UR: NEGATIVE

## 2016-05-25 MED ORDER — NAPROXEN 500 MG PO TABS: 500.0000 mg | ORAL_TABLET | Freq: Two times a day (BID) | ORAL | 0 refills | Status: DC

## 2016-05-25 MED ORDER — LORATADINE 10 MG PO TABS: 10.0000 mg | ORAL_TABLET | Freq: Every day | ORAL | 0 refills | Status: DC

## 2016-05-25 MED ORDER — AZITHROMYCIN 250 MG PO TABS
1000.0000 mg | ORAL_TABLET | Freq: Once | ORAL | Status: AC
  Administered 2016-05-25: 1000 mg via ORAL
  Filled 2016-05-25: qty 4

## 2016-05-25 MED ORDER — ERYTHROMYCIN 5 MG/GM OP OINT
1.0000 "application " | TOPICAL_OINTMENT | Freq: Once | OPHTHALMIC | Status: AC
  Administered 2016-05-25: 1 via OPHTHALMIC
  Filled 2016-05-25: qty 3.5

## 2016-05-25 NOTE — Discharge Instructions (Signed)
Use the eye ointment 3 times a day for the next 5 days.  [facility] for follow up of your pelvic pain.

## 2016-05-25 NOTE — ED Provider Notes (Signed)
[facility] Provider Note   CSN: [id number] Arrival date & time: 05/25/16  1735  By signing my name below, I, Modena Jansky, attest that this documentation has been prepared under the direction and in the presence of non-physician practitioner, Kerrie Buffalo, NP. Electronically Signed: Modena Jansky, Scribe. 05/25/2016. 7:15 PM.  History   Chief Complaint Chief Complaint  Patient presents with  . Eye Pain  . Vaginal Discharge   The history is provided by the patient. A language interpreter was used.  Eye Pain  This is a new problem. The current episode started more than 1 week ago. The problem occurs constantly. The problem has not changed since onset.Associated symptoms include headaches. Exacerbated by: outside allergen. Nothing relieves the symptoms. She has tried nothing for the symptoms.  Vaginal Discharge   This is a new problem. The problem occurs daily. The problem has not changed since onset.The discharge occurs spontaneously. The discharge was clear and malodorous. Pertinent negatives include no fever, no diarrhea, no nausea and no vomiting. She has tried nothing for the symptoms. Her past medical history does not include STD.   HPI Comments: Sharon Hess is a 21 y.o. female who presents to the Emergency Department complaining of constant moderate bilateral eye pain that started about 8 days ago. She reports associated bilateral eye itching (worse with outside pollen), bilateral eye redness, eye crusting (morning), ear pain (onset 8 days ago), sore throat, and headache. No treatment PTA for symptoms listed above.   She also complains of constant moderate lower abdominal pain that started about a week ago. She has associated vaginal discharge (malodorous, watery) and dysuria (burning). LNMP was 3 years ago due to Depo contraceptive (Last injection: March 2018). She has been with her current partner for the past 6 years.   Denies hx of STI's new sexual partners,  rhinorrhea, congestion, fever, chills, nausea, vomiting, diarrhea, back pain, neck pain, vaginal bleeding, or other complaints at this time.    PCP: Velora Heckler, MD  Past Medical History:  Diagnosis Date  . No pertinent past medical history     Patient Active Problem List   Diagnosis Date Noted  . Contraception 08/09/2015  . Acute mastitis of right breast 12/01/2014  . Fever 12/01/2014  . Low back pain 12/01/2014  . SIRS (systemic inflammatory response syndrome) (HCC)   . Mastitis in female   . Fatty liver disease, nonalcoholic 07/13/2014  . Status post vaginal delivery 06/10/2014  . Previous cesarean delivery, antepartum condition or complication 02/16/2014  . Sacral back pain 01/29/2014  . Supervision of other normal pregnancy 12/16/2013  . Scar pain 05/09/2013  . Pain, dental 05/09/2013    Past Surgical History:  Procedure Laterality Date  . ABDOMINAL SURGERY     c-section  . CESAREAN SECTION    . CESAREAN SECTION  08/21/2011   Procedure: CESAREAN SECTION;  Surgeon: Allie Bossier, MD;  Location: [facility];  Service: Gynecology;  Laterality: N/A;  Primary cesarean section of baby girl  at 0644  APGAR 9/9    OB History    Gravida Para Term Preterm AB Living   4 3 2  0 0 2   SAB TAB Ectopic Multiple Live Births   0 0 0 1 2       Home Medications    Prior to Admission medications   Medication Sig Start Date End Date Taking? Authorizing Provider  hydrocortisone valerate ointment (WESTCORT) 0.2 % Apply 1 application topically 2 (two) times daily. For 5 days  to rash on arms. Patient not taking: Reported on 12/01/2014 06/10/14   Glori Luis, MD  ibuprofen (ADVIL,MOTRIN) 600 MG tablet Take 1 tablet (600 mg total) by mouth every 8 (eight) hours as needed. 12/02/14   Smitty Cords, DO  Prenatal Vit-Fe Fumarate-FA (PRENATAL MULTIVITAMIN) TABS tablet Take 1 tablet by mouth daily. Patient not taking: Reported on 04/11/2014 08/25/13   Glori Luis, MD    Family  History Family History  Problem Relation Age of Onset  . Alcohol abuse Neg Hx   . Arthritis Neg Hx   . Asthma Neg Hx   . Birth defects Neg Hx   . Cancer Neg Hx   . COPD Neg Hx   . Depression Neg Hx   . Diabetes Neg Hx   . Drug abuse Neg Hx   . Early death Neg Hx   . Hearing loss Neg Hx   . Heart disease Neg Hx   . Hyperlipidemia Neg Hx   . Hypertension Neg Hx   . Kidney disease Neg Hx   . Learning disabilities Neg Hx   . Mental illness Neg Hx   . Mental retardation Neg Hx   . Miscarriages / Stillbirths Neg Hx   . Stroke Neg Hx   . Vision loss Neg Hx     Social History Social History  Substance Use Topics  . Smoking status: Never Smoker  . Smokeless tobacco: Never Used  . Alcohol use No     Allergies   Patient has no known allergies.   Review of Systems Review of Systems  Constitutional: Negative for chills and fever.  HENT: Positive for sore throat. Negative for congestion and rhinorrhea.   Eyes: Positive for pain, redness and itching.  Gastrointestinal: Negative for diarrhea, nausea and vomiting.  Genitourinary: Positive for vaginal discharge. Negative for vaginal bleeding.  Musculoskeletal: Negative for back pain and neck pain.  Neurological: Positive for headaches.     Physical Exam Updated Vital Signs BP 134/85 (BP Location: Left Arm)   Pulse 95   Temp 98.2 F (36.8 C) (Oral)   Resp 16   SpO2 95%   Physical Exam  Constitutional: She appears well-developed and well-nourished. No distress.  HENT:  Head: Normocephalic.  Right Ear: Tympanic membrane normal.  Left Ear: Tympanic membrane normal.  Nose: Rhinorrhea present.  Mouth/Throat: Uvula is midline and mucous membranes are normal. Posterior oropharyngeal erythema present. No posterior oropharyngeal edema.  Eyes: Conjunctivae are normal.  Sclerae are injected bilaterally.   Neck: Neck supple.  Cardiovascular: Normal rate and regular rhythm.   Pulmonary/Chest: Effort normal and breath sounds  normal. No respiratory distress.  Abdominal: Soft. Bowel sounds are normal. There is tenderness. There is no rebound, no guarding and no CVA tenderness.  TTP of the lower abdomen.   Genitourinary:  Genitourinary Comments: Pelvic exam: external genitalia without lesions, white d/c vaginal vault, Cervix inflamed, no CMT, left adnexal tenderness mild. Uterus without palpable enlargement.   Musculoskeletal: Normal range of motion.  Lymphadenopathy:    She has no cervical adenopathy.  Neurological: She is alert.  Skin: Skin is warm and dry.  Psychiatric: She has a normal mood and affect. Her behavior is normal.  Nursing note and vitals reviewed.    ED Treatments / Results  DIAGNOSTIC STUDIES: Oxygen Saturation is 95% on RA, normal by my interpretation.    COORDINATION OF CARE: 7:29 PM- Pt advised of plan for treatment and pt agrees.  Labs (all labs ordered are listed, but  only abnormal results are displayed) Labs Reviewed  WET PREP, GENITAL - Abnormal; Notable for the following:       Result Value   WBC, Wet Prep HPF POC MODERATE (*)    All other components within normal limits  BASIC METABOLIC PANEL - Abnormal; Notable for the following:    Glucose, Bld 123 (*)    All other components within normal limits  URINALYSIS, ROUTINE W REFLEX MICROSCOPIC - Abnormal; Notable for the following:    Hgb urine dipstick SMALL (*)    Squamous Epithelial / LPF 0-5 (*)    All other components within normal limits  CBC WITH DIFFERENTIAL/PLATELET  HIV ANTIBODY (ROUTINE TESTING)  RPR  POC URINE PREG, ED  GC/CHLAMYDIA PROBE AMP ([place]) NOT AT [facility]   Radiology US Transvaginal Non-ob  Result Date: 05/25/2016 CLINICAL DATA:  Pelvic pain for 2 days EXAM: TRANSABDOMINAL AND TRANSVAGINAL ULTRASOUND OF PELVIS TECHNIQUE: Both transabdominal and transvaginal ultrasound examinations of the pelvis were performed. Transabdominal technique was performed for global imaging of the pelvis including uterus,  ovaries, adnexal regions, and pelvic cul-de-sac. It was necessary to proceed with endovaginal exam following the transabdominal exam to visualize the ovaries. COMPARISON:  CT 02/28/2013 FINDINGS: Uterus Measurements: 8.1 x 3.0 x 4.7 cm. No fibroids or other mass visualized. Endometrium Thickness: 6.2 mm.  No focal abnormality visualized. Right ovary Measurements: 3.8 x 2.1 x 2.4 cm. Normal appearance/no adnexal mass. Left ovary Measurements: 3.5 x 2.3 x 2.2 cm. Normal appearance/no adnexal mass. Other findings No abnormal free fluid. IMPRESSION: Normal uterus and ovaries.  No abnormal pelvic fluid collection. Electronically Signed   By: Ellery Plunk M.D.   On: 05/25/2016 21:12   US Pelvis Complete  Result Date: 05/25/2016 CLINICAL DATA:  Pelvic pain for 2 days EXAM: TRANSABDOMINAL AND TRANSVAGINAL ULTRASOUND OF PELVIS TECHNIQUE: Both transabdominal and transvaginal ultrasound examinations of the pelvis were performed. Transabdominal technique was performed for global imaging of the pelvis including uterus, ovaries, adnexal regions, and pelvic cul-de-sac. It was necessary to proceed with endovaginal exam following the transabdominal exam to visualize the ovaries. COMPARISON:  CT 02/28/2013 FINDINGS: Uterus Measurements: 8.1 x 3.0 x 4.7 cm. No fibroids or other mass visualized. Endometrium Thickness: 6.2 mm.  No focal abnormality visualized. Right ovary Measurements: 3.8 x 2.1 x 2.4 cm. Normal appearance/no adnexal mass. Left ovary Measurements: 3.5 x 2.3 x 2.2 cm. Normal appearance/no adnexal mass. Other findings No abnormal free fluid. IMPRESSION: Normal uterus and ovaries.  No abnormal pelvic fluid collection. Electronically Signed   By: Ellery Plunk M.D.   On: 05/25/2016 21:12    Procedures Procedures (including critical care time)  Medications Ordered in ED Medications - No data to display   Initial Impression / Assessment and Plan / ED Course  I have reviewed the triage vital signs and  the nursing notes.  Pertinent labs & imaging results that were available during my care of the patient were reviewed by me and considered in my medical decision making (see chart for details).  Pt dx conjunctivitis based on presentation & eye exam, no antibiotics are indicated.  No evidence of HSV  infection. Pt is not a contact lens wearer.  Exam non-concerning for orbital cellulitis, hyphema, corneal ulcers, corneal abrasions or trauma.  Patient will be discharged home with erythromycin Opth Ointment  Patient has been instructed to use cool compresses and practice personal hygiene with frequent hand washing.  Patient understands to follow up with ophthalmology, especially if new symptoms including change  in vision, purulent drainage, or entrapment occur.  Patient dx with pelvic pain and cervicitis, cultures pending. Treated with Zithromax 1 gram PO prior to d/c. Stable for d/c without acute abdomen and normal ultrasound. [facility]   Final Clinical Impressions(s) / ED Diagnoses   Final diagnoses:  Pelvic pain in female    New Prescriptions New Prescriptions   No medications on file   I personally performed the services described in this documentation, which was scribed in my presence. The recorded information has been reviewed and is accurate.     [address]Aylinn Rydberg [place] Brittne Kawasaki, NP 05/25/16 2248    Tilden FossaElizabeth Rees, MD 05/26/16 [phone]

## 2016-05-25 NOTE — ED Notes (Signed)
Declined W/C at D/C and was escorted to lobby by RN. 

## 2016-05-25 NOTE — ED Triage Notes (Signed)
Triage done using ipad spanish interpreter. Pt reports itching to both eyes, headache, and runny nose. She also reports foul smelling vaginal discharge.

## 2016-05-26 LAB — HIV ANTIBODY (ROUTINE TESTING W REFLEX): HIV Screen 4th Generation wRfx: NONREACTIVE

## 2016-05-26 LAB — GC/CHLAMYDIA PROBE AMP (~~LOC~~) NOT AT ARMC
Chlamydia: NEGATIVE
Neisseria Gonorrhea: NEGATIVE

## 2016-05-26 LAB — RPR: RPR: NONREACTIVE

## 2017-11-29 ENCOUNTER — Encounter: Payer: Self-pay | Admitting: Family Medicine

## 2017-11-29 ENCOUNTER — Ambulatory Visit (INDEPENDENT_AMBULATORY_CARE_PROVIDER_SITE_OTHER): Payer: Self-pay | Admitting: Family Medicine

## 2017-11-29 ENCOUNTER — Other Ambulatory Visit (HOSPITAL_COMMUNITY)
Admission: RE | Admit: 2017-11-29 | Discharge: 2017-11-29 | Disposition: A | Discharge status: Home / Self Care | Payer: Self-pay | Source: Ambulatory Visit | Attending: Family Medicine | Admitting: Family Medicine

## 2017-11-29 ENCOUNTER — Other Ambulatory Visit: Payer: Self-pay

## 2017-11-29 VITALS — BP 98/62 | HR 93 | Temp 98.1°F | Wt 213.0 lb

## 2017-11-29 DIAGNOSIS — M25521 Pain in right elbow: Secondary | ICD-10-CM

## 2017-11-29 DIAGNOSIS — G8929 Other chronic pain: Secondary | ICD-10-CM

## 2017-11-29 DIAGNOSIS — M545 Low back pain: Secondary | ICD-10-CM

## 2017-11-29 DIAGNOSIS — Z124 Encounter for screening for malignant neoplasm of cervix: Secondary | ICD-10-CM | POA: Insufficient documentation

## 2017-11-29 DIAGNOSIS — N912 Amenorrhea, unspecified: Secondary | ICD-10-CM

## 2017-11-29 DIAGNOSIS — Z0000 Encounter for general adult medical examination without abnormal findings: Secondary | ICD-10-CM

## 2017-11-29 LAB — POCT URINE PREGNANCY: Preg Test, Ur: NEGATIVE

## 2017-11-29 NOTE — Assessment & Plan Note (Signed)
Pap collected today Given health department information for flu shot

## 2017-11-29 NOTE — Assessment & Plan Note (Signed)
Chronic long standing back pain without red flag symptoms or findings on exam. Recommended trial of home exercises and stretches

## 2017-11-29 NOTE — Patient Instructions (Signed)
Low Back Strain With Rehab Low Back Strain Rehab Consulte al mdico qu ejercicios son seguros para usted. Haga los ejercicios exactamente como se lo haya indicado el mdico y gradelos como se lo hayan indicado. Es normal sentir un leve estiramiento, tirn, rigidez o molestia cuando haga estos ejercicios, pero debe detenerse de inmediato si siento un dolor repentino o si el dolor empeora. No comience a hacer estos ejercicios hasta que se lo indique el mdico. EJERCICIOS DE Pilgrim's Pride Y AMPLITUD DE MOVIMIENTOS Estos ejercicios calientan los msculos y las articulaciones, y mejoran el movimiento y la flexibilidad de la espalda. Estos ejercicios tambin ayudan a Engineer, materials, el adormecimiento y el hormigueo. Ejercicio A: Rodilla al pecho 1. Acustese boca arriba en una superficie firme con las piernas extendidas. 2. Flexione una rodilla. Tome la rodilla con las manos y llvela hacia el pecho hasta que sienta un estiramiento suave en la parte inferior de la espalda y las nalgas ? Mantenga la otra pierna lo ms extendida posible. ? Mantenga la otra pierna lo ms extendida posible. 3. Mantenga esta posicin durante __________ segundos. 4. Vuelva lentamente a la posicin inicial. 5. Repita el ejercicio con la otra pierna.  Repita __________ veces. Realice este ejercicio __________ veces al da. Ejercicio B: Extensin [employer] codos, en decbito prono 1. Acustese boca abajo sobre una superficie firme. 2. Apyese sobre los codos. 3. Con los brazos, aydese a Haematologist sentir un leve estiramiento en el abdomen y la parte inferior de la espalda. ? Electrical engineer algo de [employer] codos. Si no se siente cmodo, intente colocando almohadas debajo del pecho. ? Debe dejar la cadera inmvil sobre la superficie en la que est apoyado. Mantenga la cadera y los msculos de la espalda relajados. 4. Mantenga esta posicin durante __________ segundos. 5. Afloje lentamente la parte superior  del cuerpo y vuelva a la posicin inicial.  Repita __________ veces. Realice este ejercicio __________ veces al da. STRENGTHENING EXERCISES Estos ejercicios fortalecen la espalda y le otorgan resistencia. La resistencia es la capacidad de usar los msculos durante un tiempo prolongado, incluso despus de que se cansen. Ejercicio C: Inclinacin de la pelvis 1. Acustese boca arriba sobre una superficie firme. Flexione las rodillas y [employer]. 2. Tensione los msculos abdominales. Eleve la pelvis hacia el techo y aplane la parte inferior de la espalda contra el suelo. ? Para realizar este ejercicio, puede colocar una toalla pequea debajo de la parte inferior de la espalda y presionar la espalda contra la toalla. 3. Mantenga esta posicin durante __________ segundos. 4. Relaje totalmente los msculos antes de repetir el ejercicio.  Repita __________ veces. Realice este ejercicio __________ veces al da. Ejercicio D: Elevaciones alternadas de pierna y brazo 1. Apoye las palmas de las manos y las rodillas sobre una superficie firme. Si se colocar sobre una superficie muy dura, puede usar un elemento acolchado para apoyar las rodillas, como una alfombrilla para ejercicios. 2. Alinee los brazos y las piernas. Las manos deben estar debajo de los hombros y las rodillas debajo de la cadera. 3. Eleve la pierna izquierda hacia atrs. Al mismo tiempo, eleve el brazo derecho y Associate Professor frente a usted. ? No eleve la pierna por encima de la cadera. ? No eleve el brazo por encima del hombro. ? Mantenga los msculos del abdomen y de la espalda contrados. ? Mantenga la cadera [employer] el suelo. ? No arquee la espalda. ? Mantenga el equilibrio con cuidado y  no contenga la respiracin. 4. Mantenga esta posicin durante __________ segundos. 5. Lentamente regrese a la posicin inicial y repita el ejercicio con la pierna derecha y el brazo izquierdo.  Repita __________ veces. Realice  este ejercicio __________ veces al da. Ejercicio J: Bajar una pierna con rodillas flexionadas 1. Acustese boca arriba sobre una superficie firme. 2. Apriete los msculos abdominales y [employer] del piso, uno a la vez, de modo que las rodillas y la cadera estn flexionadas en forma de "L" (a aproximadamente 90 grados). ? Las rodillas deben estar por encima de la cadera y las pantorrillas deben quedar paralelas al piso. 3. Con los msculos abdominales tensos y la rodilla flexionada, baje lentamente una pierna de modo que los dedos del pie toquen el suelo. 4. Levante la pierna para volver a la posicin inicial. ? No contenga la respiracin. ? No deje que la espalda se arquee. Mantenga la espalda plana contra el suelo. 5. Repita el ejercicio con la otra pierna.  Repita __________ veces. Realice este ejercicio __________ veces al da. Tyson Dense Y MECNICA CORPORAL La Water quality scientist se refiere a los movimientos y a las posiciones del cuerpo mientras realiza las actividades diarias. La postura es una parte de la Water quality scientist. La buena postura y la Administrator, sports corporal saludable pueden ayudar a Acupuncturist estrs en las articulaciones y los tejidos del cuerpo. La buena postura significa que la columna mantiene su posicin natural de curvatura en forma de S (la columna est en una posicin neutral), los hombros Zenaida Niece un poco hacia atrs y la cabeza no se inclina hacia adelante. A continuacin, se incluyen pautas generales para mejorar la postura y Regulatory affairs officer en las actividades diarias. De pie  Al estar de pie, mantenga la columna en la posicin neutral y los pies separados al ancho de caderas, aproximadamente. Mantenga las rodillas ligeramente flexionadas. Las [place], los hombros y las caderas deben estar alineados.  Cuando realice una tarea en la que deba estar de pie en el mismo sitio durante mucho tiempo, coloque un pie en un objeto estable de 2 a 4 pulgadas (5 a 10 cm) de alto, como  un taburete. Esto ayuda a que la columna mantenga una posicin neutral.  Sentado  Cuando est sentado, mantenga la columna en posicin neutral y deje los pies apoyados en el suelo. Use un apoyapis, si es necesario, y [employer]Ex muslos paralelos al suelo. Evite redondear los hombros e inclinar la cabeza hacia adelante.  Cuando trabaje en un escritorio o con una computadora, el escritorio debe estar a una altura en la que las manos estn un poco ms abajo que los codos. Deslice la silla debajo del escritorio, de modo de estar lo suficientemente cerca como para mantener una buena [place].  Cuando trabaje con una computadora, coloque el monitor a una altura que le permita mirar derecho hacia adelante, sin tener que inclinar la cabeza hacia adelante o [place] atrs.  Reposo Al descansar o estar acostado, evite las posiciones que le causen ms dolor.  Si siente dolor al hacer actividades que exigen sentarse, inclinarse, agacharse o ponerse en cuclillas (actividades basadas en la flexin), acustese en una posicin en la que el cuerpo no deba doblarse mucho. Por ejemplo, evite acurrucarse de costado con los brazos y las rodillas cerca del pecho (posicin fetal).  Si siente dolor con las actividades que exigen estar de pie durante mucho tiempo o Printmaker brazos (actividades basadas en la extensin), acustese con la columna en Neomia Dear  posicin neutral y flexione ligeramente las rodillas. Pruebe con las siguientes posiciones: ? Teacher, music de costado con una almohada entre las rodillas. ? Acostarse boca arriba con una almohada debajo de las rodillas.  Levantar objetos  Cuando tenga que levantar un objeto, mantenga los pies separados el ancho de los hombros y apriete los msculos abdominales.  Flexione las rodillas y la cadera, y Dietitian la columna en posicin neutral. Es importante levantar utilizando la fuerza de las piernas, no de la espalda. No trabe las rodillas hacia afuera.  Siempre pida ayuda a  otra persona para levantar objetos pesados o incmodos.  Esta informacin no tiene Theme park manager el consejo del mdico. Asegrese de hacerle al mdico cualquier pregunta que tenga. Document Released: 10/26/2005 Document Revised: 05/26/2014 Document Reviewed: 10/21/2014 Elsevier Interactive Patient Education  2018 Elsevier Inc.   Mid-Back Strain With Rehab Mid-Back Strain Rehab Consulte al mdico qu ejercicios son seguros para usted. Haga los ejercicios exactamente como se lo haya indicado el mdico y gradelos como se lo hayan indicado. Es normal sentir un leve estiramiento, tirn, rigidez o molestia cuando haga estos ejercicios, pero debe detenerse de inmediato si siento un dolor repentino o si el dolor empeora. No comience a hacer estos ejercicios hasta que se lo indique el mdico. EJERCICIOS DE Pilgrim's Pride Y AMPLITUD DE MOVIMIENTOS Este ejercicio calienta los msculos y las articulaciones, y mejora la movilidad y la flexibilidad de la espalda y los hombros. Adems, ayuda a Engineer, materials. Ejercicio A: Estiramiento de pecho y columna 1. Acustese boca arriba sobre una superficie firme. 2. Use una toalla o una manta pequea para armar un rollo de 4 pulgadas (10 cm) de dimetro. 3. Coloque la toalla a lo largo, debajo de la parte media de la espalda de modo que quede debajo de la columna, pero no debajo de los omplatos. 4. Para aumentar el estiramiento, puede colocar las manos debajo de la cabeza y dejar que los codos caigan a los costados. 5. Mantenga esta posicin durante __________ segundos.  Repita el ejercicio __________ veces. Realice este ejercicio __________ veces al da. EJERCICIOS DE FORTALECIMIENTO Estos ejercicios fortalecen los msculos de la espalda y los omplatos, y les otorgan resistencia. La resistencia es la capacidad de usar los msculos durante un tiempo prolongado, incluso despus de que se cansen. Ejercicio B: Elevaciones alternadas de pierna y brazo 1. Apoye  las palmas de las manos y las rodillas sobre una superficie firme. Si se colocar sobre una superficie muy dura, puede usar un elemento acolchado para apoyar las rodillas, como una alfombrilla para ejercicios. 2. Alinee los brazos y las piernas. Las manos deben estar debajo de los hombros y las rodillas debajo de la cadera. 3. Eleve la pierna izquierda hacia atrs. Al mismo tiempo, eleve el brazo derecho y Associate Professor frente a usted. ? No eleve la pierna por encima de la cadera. ? No eleve el brazo por encima del hombro. ? Mantenga los msculos del abdomen y de la espalda contrados. ? Mantenga la cadera [employer] el suelo. ? No arquee la espalda. ? Mantenga el equilibrio con cuidado y no contenga la respiracin. 4. Mantenga esta posicin durante __________ segundos. 5. Lentamente regrese a la posicin inicial y repita el ejercicio con la pierna derecha y el brazo izquierdo.  Repita __________ veces. Realice este ejercicio __________ veces al da. Ejercicio C: Remar con los brazos extendidos (extensin de hombros) 1. Prese con los pies separados a la distancia de los hombros. 2. Daylene Posey banda para  ejercicios a un objeto estable que est frente a usted, de modo que la banda est por encima de la altura del hombro o en el mismo nivel. 3. Sostenga un extremo de la banda para ejercicios en cada mano. 4. Extienda los codos y [employer] manos a la altura de los hombros. 5. Camine hacia atrs, alejndose del extremo fijo de la banda para ejercicios, hasta que [place] se tense. 6. Junte los omplatos y baje las manos hacia los costados de los muslos. Detngase cuando las manos estn en la misma posicin en ambos costados. No deje que las manos vayan hacia atrs del cuerpo. 7. Mantenga esta posicin durante __________ segundos. 8. Vuelva lentamente a la posicin inicial.  Repita __________ veces. Realice este ejercicio __________ veces al da. Ejercicio D: Rotacin externa con el hombro, en decbito  prono 1. Acustese boca abajo en una cama firme de modo que el antebrazo izquierdo/derecho cuelgue sobre el borde de la cama y la parte superior del brazo quede sobre la cama, extendido lejos del cuerpo. ? El codo debe estar flexionado. ? La palma debe [employer]. 2. Si se lo indican, sostenga una pesa de __________ con la mano. 3. Contraiga los omplatos hacia el centro de la espalda. No levante el hombro hacia la [place]. 4. Mantenga el codo flexionado en forma de "L" (90 grados) mientras mueve el antebrazo hacia el techo lentamente. Mueva el antebrazo hasta la altura de la cama en direccin a la cabeza. ? No mueva la parte superior del brazo. ? En el final del movimiento, la palma debe apuntar hacia el suelo. 5. Mantenga esta posicin durante __________ segundos. 6. Vuelva lentamente a la posicin inicial y relaje los msculos.  Repita __________ veces. Realice este ejercicio __________ veces al da. Ejercicio E: Retraccin escapular y rotacin externa, remo 1. Sintese en una silla estable que no tenga apoyabrazos o pngase de pie. 2. Ate una banda para ejercicios a un objeto estable que est frente a usted, de modo que la banda est a la altura del hombro. 3. Sostenga un extremo de la banda para ejercicios en cada mano. 4. Lleve los brazos extendidos adelante. 5. Camine hacia atrs, alejndose del extremo fijo de la banda para ejercicios, [employer] se tense. 6. Tire la [employer]. Mientras hace esto, flexione los codos y [employer], pero evite mover el resto del cuerpo. No levante los hombros [employer]. 7. Detngase cuando los codos estn a los lados o ligeramente detrs del cuerpo. 8. Mantenga esta posicin durante __________ segundos. 9. Extienda lentamente los brazos para regresar a la posicin inicial.  Repita __________ veces. Realice este ejercicio __________ veces al da. Tyson Dense Y MECNICA CORPORAL La Water quality scientist se refiere a los  movimientos y a las posiciones del cuerpo mientras realiza las actividades diarias. La postura es una parte de la Water quality scientist. La buena postura y la Administrator, sports corporal saludable pueden ayudar a Acupuncturist estrs en las articulaciones y los tejidos del cuerpo. La buena postura significa que la columna mantiene su posicin natural de curvatura en forma de S (la columna est en una posicin neutral), los hombros Zenaida Niece un poco hacia atrs y la cabeza no se inclina hacia adelante. A continuacin, se incluyen pautas generales para mejorar la postura y Regulatory affairs officer en las actividades diarias. De pie  Al estar de pie, mantenga la columna en la posicin neutral y los pies separados al ancho de caderas, aproximadamente. Mantenga las  rodillas ligeramente flexionadas. Las [place], los hombros y las caderas deben estar alineados.  Cuando realice una tarea en la que deba inclinarse hacia adelante y estar de pie en el mismo sitio durante mucho tiempo, coloque un pie en un objeto estable de 2 a 4 pulgadas (5 a 10 cm) de alto, como un taburete. Esto ayuda a que la columna mantenga una posicin neutral.  Sentado  Cuando est sentado, mantenga la columna en posicin neutral y deje los pies apoyados en el suelo. Use un apoyapis, si es necesario, y [employer] muslos paralelos al suelo. Evite redondear los hombros e inclinar la cabeza hacia adelante.  Cuando trabaje en un escritorio o con una computadora, el escritorio debe estar a una altura en la que las manos estn un poco ms abajo que los codos. Deslice la silla debajo del escritorio, de modo de estar lo suficientemente cerca como para mantener una buena [place].  Cuando trabaje con una computadora, coloque el monitor a una altura que le permita mirar derecho hacia adelante, sin tener que inclinar la cabeza hacia adelante o [place] atrs.  Reposo Al descansar o estar acostado, evite las posiciones que le causen ms dolor.  Si siente dolor al hacer  actividades que exigen sentarse, inclinarse, agacharse o ponerse en cuclillas (actividades basadas en la flexin), acustese en una posicin en la que el cuerpo no deba doblarse mucho. Por ejemplo, evite acurrucarse de costado con los brazos y las rodillas cerca del pecho (posicin fetal).  Si siente dolor con las actividades que exigen estar de pie durante mucho tiempo o Furniture conservator/restorer los brazos (actividades basadas en la extensin), acustese con la columna en una posicin neutral y flexione ligeramente las rodillas. Pruebe con las siguientes posiciones: ? Teacher, music de costado con una almohada entre las rodillas. ? Acostarse boca arriba con una almohada debajo de las rodillas.  Levantar objetos  Cuando tenga que levantar un objeto, mantenga los pies separados el ancho de los hombros y apriete los msculos abdominales.  Flexione las rodillas y la cadera, y Dietitian la columna en posicin neutral. Es importante levantar utilizando la fuerza de las piernas, no de la espalda. No trabe las rodillas hacia afuera.  Siempre pida ayuda a otra persona para levantar objetos pesados o incmodos.  Esta informacin no tiene Theme park manager el consejo del mdico. Asegrese de hacerle al mdico cualquier pregunta que tenga. Document Released: 10/26/2005 Document Revised: 05/26/2014 Document Reviewed: 10/21/2014 Elsevier Interactive Patient Education  2018 [employer].

## 2017-11-29 NOTE — Progress Notes (Signed)
Subjective:  Sharon Hess is a 22 y.o. female who presents to the [facility] today for annual well exam  HPI:  Back pain Chronic back pain for several years. Diffuse over her back, worse recently. Feels like "knots" on that part of her back. Similar to her back pain just exacerbated.  No bowel or bladder incontinence No numbness or weakness  Lack of periods Stopped depo in January.  Last period was 6 years ago. Has had a baby in the intervening time.  Arm pain Fell on her elbow yesterday. Tender to touch. No popping or cracking sound or sensation. No swelling. Having some trouble bending her elbow  Health maintenance Wants a flu shot   ROS: Per HPI, otherwise all systems reviewed and negative  PMH:  The following were reviewed and entered/updated in epic: Past Medical History:  Diagnosis Date  . No pertinent past medical history   . Previous cesarean delivery, antepartum condition or complication 02/16/2014   OP arrest at 7 cm--desires TOLAC--consent signed    Patient Active Problem List   Diagnosis Date Noted  . Contraception 08/09/2015  . Low back pain 12/01/2014  . Fatty liver disease, nonalcoholic 07/13/2014  . Sacral back pain 01/29/2014  . Pain, dental 05/09/2013   Past Surgical History:  Procedure Laterality Date  . ABDOMINAL SURGERY     c-section  . CESAREAN SECTION    . CESAREAN SECTION  08/21/2011   Procedure: CESAREAN SECTION;  Surgeon: Allie Bossier, MD;  Location: [facility];  Service: Gynecology;  Laterality: N/A;  Primary cesarean section of baby girl  at 86  APGAR 9/9   Fam Hx: states all her family members are healthy without medical problems  Social Hx: She reports that she has never smoked. She has never used smokeless tobacco. She reports that she does not drink alcohol or use drugs.   Objective:  Physical Exam: BP 98/62   Pulse 93   Temp 98.1 F (36.7 C) (Oral)   Wt 213 lb (96.6 kg)   SpO2 98%   BMI 40.25 kg/m   Gen: NAD,  resting comfortably CV: RRR with no murmurs appreciated Pulm: NWOB, CTAB with no crackles, wheezes, or rhonchi GI: Normal bowel sounds present. Soft, Nontender, Nondistended. MSK: no edema, cyanosis, or clubbing noted. L UE with full 5/5 strength with flexion, extension, pronation and supination at wrist and elbow. Normal ROM of shoulder. TTP diffusely over elbow. No swelling. No bony tenderness on lumbar or thoracic spine. Skin: warm, dry. No ecchymosis over elbow Pelvic exam: normal external genitalia, vulva, vagina, cervix, uterus and adnexa. Neuro: grossly normal, moves all extremities. Normal gait.  Psych: Normal affect and thought content  Results for orders placed or performed in visit on 11/29/17 (from the past 72 hour(s))  POCT urine pregnancy     Status: None   Collection Time: 11/29/17  2:00 PM  Result Value Ref Range   Preg Test, Ur Negative Negative     Assessment/Plan:  Wellness examination Pap collected today Given health department information for flu shot  Right elbow pain Patient has good motion and strength. Is diffusely tender without edema or ecchymosis. Unlikely to have a fracture. Discussed symptomatic management with NSAIDs and ice.  Low back pain Chronic long standing back pain without red flag symptoms or findings on exam. Recommended trial of home exercises and stretches  Amenorrhea Negative pregnancy test.  Advised that patient return for a separate visit to fully discuss her amenorrhea.  Would  talk about her weight as well as discussed any necessary blood work at that time.   Leland Her, DO PGY-3, [facility] 11/29/2017 1:47 PM

## 2017-11-29 NOTE — Assessment & Plan Note (Signed)
Negative pregnancy test.  Advised that patient return for a separate visit to fully discuss her amenorrhea.  Would talk about her weight as well as discussed any necessary blood work at that time.

## 2017-11-29 NOTE — Assessment & Plan Note (Signed)
Patient has good motion and strength. Is diffusely tender without edema or ecchymosis. Unlikely to have a fracture. Discussed symptomatic management with NSAIDs and ice.

## 2017-12-04 LAB — CYTOLOGY - PAP
ADEQUACY: ABSENT
CHLAMYDIA, DNA PROBE: NEGATIVE
DIAGNOSIS: NEGATIVE
Neisseria Gonorrhea: NEGATIVE

## 2017-12-05 ENCOUNTER — Encounter: Payer: Self-pay | Admitting: Family Medicine

## 2019-01-13 ENCOUNTER — Telehealth: Payer: Self-pay | Admitting: Family Medicine

## 2019-01-13 NOTE — Telephone Encounter (Signed)
Called patient with interpreter ID [phone], we was calling to let the patient know her appointment with our office on 12-23 had to be changed to  01-30-2019 due to changes in the office, the patient didn't answer and her mailbox was not set up yet.

## 2019-01-15 ENCOUNTER — Ambulatory Visit: Payer: Self-pay

## 2019-01-24 DIAGNOSIS — O1490 Unspecified pre-eclampsia, unspecified trimester: Secondary | ICD-10-CM

## 2019-01-24 HISTORY — DX: Unspecified pre-eclampsia, unspecified trimester: O14.90

## 2019-01-24 NOTE — L&D Delivery Note (Addendum)
Patient: Sharon Hess MRN: [id number]  GBS status: Negative  Patient is a 24 y.o. now [id number] s/p NSVD at [redacted]w[redacted]d, who was admitted for SOL. AROM 9h 73m prior to delivery with clear fluid.  Initial SVE 4/70/-2. Patient progressed to 6 cm and AROM performed. Progressed to 8 cm and then Pitocin started as no cervical change for several hours. Eventually progressed to complete.  Delivery Note Patient began pushing after progressing to complete. Head delivered LOA. No nuchal cord present. Downward traction was applied and patient noted to have a shoulder dystocia. Infant's posterior arm was palpated and counterclockwise rotation was applied, allowing for delivery of the shoulder and body (15 seconds total). Infant with spontaneous cry, placed on mother's abdomen, dried and bulb suctioned. Cord clamped x 2 after 1-minute delay, and cut by family member. Cord blood drawn. Placenta delivered spontaneously with gentle cord traction. Fundus boggy but firming with massage and Pitocin. Perineum inspected and found to be intact.  At 6:24 PM a viable female was delivered via NSVD (Presentation: LOA).  APGAR: 8, 9; weight  4766 g.   Placenta status:  Spontaneous,  Intact.  Cord: 3 vessels.  Anesthesia: Epidural Episiotomy: N/A Lacerations: None Est. Blood Loss (mL): 520 cc  Mom to postpartum.  Baby to Couplet care / Skin to Skin.  Worthy Rancher, MD 08/06/2019, 6:41 PM  OB FELLOW DELIVERY ATTESTATION  I was gloved and present for the delivery in its entirety, and I agree with the above resident's note. Brisk bleeding noted prior to delivery of placenta and Methergine given, Pitocin given as bolus. No lacerations noted. EBL 520 mL at delivery but 15-20 minutes after delivery, heavy bleeding with clots persisted. Fundal sweep done with evacuation of large amount of clots and boggy tone noted. Rectal Cytotec and TXA given. At next rub, bleeding persisted. Hemabate given. Second bag of TXA and  Pitocin given. Bleeding still brisk with massage and upon manual uterine exam, tone still boggy, firming some with massage. Decision made to place Bakri. Placed without difficulty and filled with 350 mL saline. Bleeding stabilized. Foley catheter previously placed and will leave in place overnight with Bakri. DIC panel and CBC drawn now. Vitals stable. Will give another dose of Methergine 2 hours after first. Dr. Shawnie Pons updated; will send to [facility].  Jerilynn Birkenhead, MD [facility] Family [facility] Fellow, [facility] for [employer], [facility]

## 2019-01-30 ENCOUNTER — Inpatient Hospital Stay (HOSPITAL_COMMUNITY)
Admission: AD | Admit: 2019-01-30 | Discharge: 2019-01-30 | Disposition: A | Discharge status: Home / Self Care | Payer: Self-pay | Attending: Family Medicine | Admitting: Family Medicine

## 2019-01-30 ENCOUNTER — Ambulatory Visit (INDEPENDENT_AMBULATORY_CARE_PROVIDER_SITE_OTHER): Payer: Self-pay

## 2019-01-30 ENCOUNTER — Inpatient Hospital Stay (HOSPITAL_COMMUNITY): Payer: Self-pay

## 2019-01-30 ENCOUNTER — Other Ambulatory Visit: Payer: Self-pay

## 2019-01-30 ENCOUNTER — Encounter (HOSPITAL_COMMUNITY): Payer: Self-pay | Admitting: Family Medicine

## 2019-01-30 DIAGNOSIS — R300 Dysuria: Secondary | ICD-10-CM | POA: Insufficient documentation

## 2019-01-30 DIAGNOSIS — O26892 Other specified pregnancy related conditions, second trimester: Secondary | ICD-10-CM

## 2019-01-30 DIAGNOSIS — R350 Frequency of micturition: Secondary | ICD-10-CM | POA: Insufficient documentation

## 2019-01-30 DIAGNOSIS — N898 Other specified noninflammatory disorders of vagina: Secondary | ICD-10-CM

## 2019-01-30 DIAGNOSIS — Z3A14 14 weeks gestation of pregnancy: Secondary | ICD-10-CM | POA: Insufficient documentation

## 2019-01-30 DIAGNOSIS — O26899 Other specified pregnancy related conditions, unspecified trimester: Secondary | ICD-10-CM

## 2019-01-30 DIAGNOSIS — R102 Pelvic and perineal pain: Secondary | ICD-10-CM | POA: Insufficient documentation

## 2019-01-30 DIAGNOSIS — R309 Painful micturition, unspecified: Secondary | ICD-10-CM

## 2019-01-30 DIAGNOSIS — Z3201 Encounter for pregnancy test, result positive: Secondary | ICD-10-CM

## 2019-01-30 DIAGNOSIS — O99891 Other specified diseases and conditions complicating pregnancy: Secondary | ICD-10-CM | POA: Insufficient documentation

## 2019-01-30 DIAGNOSIS — R109 Unspecified abdominal pain: Secondary | ICD-10-CM

## 2019-01-30 DIAGNOSIS — R8290 Unspecified abnormal findings in urine: Secondary | ICD-10-CM

## 2019-01-30 DIAGNOSIS — O34219 Maternal care for unspecified type scar from previous cesarean delivery: Secondary | ICD-10-CM | POA: Insufficient documentation

## 2019-01-30 DIAGNOSIS — Z113 Encounter for screening for infections with a predominantly sexual mode of transmission: Secondary | ICD-10-CM

## 2019-01-30 DIAGNOSIS — N949 Unspecified condition associated with female genital organs and menstrual cycle: Secondary | ICD-10-CM

## 2019-01-30 DIAGNOSIS — Z3687 Encounter for antenatal screening for uncertain dates: Secondary | ICD-10-CM

## 2019-01-30 DIAGNOSIS — O26891 Other specified pregnancy related conditions, first trimester: Secondary | ICD-10-CM

## 2019-01-30 HISTORY — DX: Fatty (change of) liver, not elsewhere classified: K76.0

## 2019-01-30 HISTORY — DX: Unspecified viral hepatitis B without hepatic coma: B19.10

## 2019-01-30 LAB — HCG, QUANTITATIVE, PREGNANCY: hCG, Beta Chain, Quant, S: 107322 m[IU]/mL — ABNORMAL HIGH (ref <5)

## 2019-01-30 LAB — POCT URINALYSIS DIP (DEVICE)
Bilirubin Urine: NEGATIVE
Glucose, UA: NEGATIVE mg/dL
Hgb urine dipstick: NEGATIVE
Ketones, ur: NEGATIVE mg/dL
Nitrite: NEGATIVE
Protein, ur: NEGATIVE mg/dL
Specific Gravity, Urine: 1.02 (ref 1.005–1.030)
Urobilinogen, UA: 2 mg/dL — ABNORMAL HIGH (ref 0.0–1.0)
pH: 6.5 (ref 5.0–8.0)

## 2019-01-30 LAB — POCT PREGNANCY, URINE: Preg Test, Ur: POSITIVE — AB

## 2019-01-30 LAB — CBC
HCT: 37.7 % (ref 36.0–46.0)
Hemoglobin: 12.8 g/dL (ref 12.0–15.0)
MCH: 29.8 pg (ref 26.0–34.0)
MCHC: 34 g/dL (ref 30.0–36.0)
MCV: 87.7 fL (ref 80.0–100.0)
Platelets: 249 10*3/uL (ref 150–400)
RBC: 4.3 MIL/uL (ref 3.87–5.11)
RDW: 13.2 % (ref 11.5–15.5)
WBC: 9.8 10*3/uL (ref 4.0–10.5)
nRBC: 0 % (ref 0.0–0.2)

## 2019-01-30 NOTE — Discharge Instructions (Signed)
Dolor del ligamento redondo Round Ligament Pain  El ligamento redondo es un cordn de msculo y tejido que sirve de sostn para Nurse, learning disability. Puede volverse una fuente de dolor durante el embarazo si se distiende o se torsiona a medida que el beb crece. Generalmente, el dolor [employer] trimestre (semanas 37 a 34) de [place], y Audiological scientist y Office manager el momento del [place]. No se trata de un problema grave y no es perjudicial para el beb. El dolor del ligamento redondo suele ser agudo y punzante, y [place], pero tambin puede ser sordo, persistente y continuo. Se lo percibe en la regin inferior del abdomen o en la ingle. A menudo comienza en la zona ms profunda de la ingle y se extiende hacia regin externa de la cadera. El dolor puede producirse cuando usted:  Cambia sbitamente de posicin, como pasar rpidamente de estar sentada a ponerse de pie.  Se da vuelta en la cama.  Tose o estornuda.  Hace actividad fsica. Siga estas indicaciones en su casa:   Controle su afeccin para detectar cualquier cambio.  Cuando el dolor comience, reljese. Luego pruebe cualquiera de estos mtodos para aliviar el dolor: ? Regulatory affairs officer. ? Flexionar las rodillas hacia el abdomen. ? Acostarse de costado con una almohada debajo del abdomen y [employer] las piernas. ? Sentarse en una baera con agua tibia durante 15 a 57minutos o hasta que el dolor desaparezca.  Tome los medicamentos de venta libre y los recetados solamente como se lo haya indicado el mdico.  Muvase lentamente cuando se siente o se ponga de pie.  No haga caminatas largas si le generan dolor.  Suspenda o reduzca las actividades fsicas si Firefighter.  Concurra a todas las visitas de control como se lo haya indicado el mdico. Esto es importante. Comunquese con un mdico si:  El dolor no desaparece con Dispensing optician.  Tiene un dolor en la espalda que no tena antes.  El medicamento no resulta  eficaz. Solicite ayuda inmediatamente si:  Tiene fiebre o escalofros.  Tiene contracciones uterinas.  Tiene una hemorragia vaginal abundante.  Tiene nuseas o vmitos.  Tiene diarrea.  Siente dolor al [employer]. Resumen  El dolor del ligamento redondo se siente en la parte inferior del abdomen o la ingle. Generalmente es un dolor agudo y punzante, y dura poco tiempo. Tambin puede ser un dolor sordo, persistente y continuo.  Este dolor por lo general empieza en el segundo trimestre (semanas 13 a 28). Se produce porque el tero se estira a medida que el beb crece, y no es perjudicial para el beb.  Usted puede notar el dolor cuando cambia sbitamente de posicin, cuando toce o estornuda, o durante la actividad fsica.  Relajarse, flexionar las rodillas hacia el abdomen, acostarse sobre un lado o tomar un bao de agua tibia pueden ayudar a Engineer, manufacturing.  Solicite ayuda a su mdico si el dolor no desaparece o si tiene hemorragia vaginal, nuseas, vmitos, diarrea o dolor al [employer]. Esta informacin no tiene Marine scientist el consejo del mdico. Asegrese de hacerle al mdico cualquier pregunta que tenga. Document Revised: 08/23/2017 Document Reviewed: 08/23/2017 Elsevier Patient Education  [place] abdominal en los adultos Abdominal Pain, Adult El dolor de [place] (abdominal) puede tener muchas causas. [place] veces, el dolor de [place] no es peligroso. Muchos de [employer] de dolor de estmago pueden controlarse y tratarse en casa. Sin embargo,  a veces, el dolor de estmago es grave. El mdico intentar descubrir la causa del dolor de [place]. Siga estas instrucciones en su casa:  Medicamentos  [employer] de venta libre y los recetados solamente como se lo haya indicado el mdico.  No tome medicamentos que lo ayuden a Advertising copywriter (laxantes), salvo que el mdico se lo indique. Instrucciones generales  Est atento al dolor de  estmago para Insurance risk surveyor cambio.  Beba suficiente lquido para Radio producer pis (la orina) de color amarillo plido.  Concurra a todas las visitas de [address] se lo haya indicado el mdico. Esto es importante. Comunquese con un mdico si:  El dolor de [address] cambia o [place].  No tiene apetito o baja de peso sin proponrselo.  Tiene dificultades para defecar (est estreido) o heces lquidas (diarrea) durante ms de 2 o 3das.  Siente dolor al orinar o defecar.  El dolor de estmago lo despierta de noche.  El dolor empeora con las comidas, despus de comer o con determinados alimentos.  Tiene vmitos y no puede retener nada de lo que ingiere.  Tiene fiebre.  Observa sangre en la orina. Solicite ayuda de inmediato si:  El dolor no desaparece en el tiempo indicado por el mdico.  No puede dejar de vomitar.  Siente dolor solamente en zonas especficas del abdomen, como el lado derecho o la parte inferior izquierda.  Tiene heces con sangre, de color negro o con aspecto alquitranado.  Tiene dolor muy intenso en el vientre, clicos o meteorismo.  Presenta signos de no tener suficientes lquidos o agua en el cuerpo (deshidratacin), por ejemplo: ? [place], muy escasa o falta de orina. ? Labios agrietados. ? Sequedad de boca. ? Ojos hundidos. ? Somnolencia. ? Debilidad.  Tiene dificultad para respirar o Journalist, newspaper. Resumen  Muchos de [employer] de dolor de estmago pueden controlarse y tratarse en casa.  Est atento al dolor de estmago para Insurance risk surveyor cambio.  Tome los medicamentos de venta libre y los recetados solamente como se lo haya indicado el mdico.  Comunquese con un mdico si el dolor de estmago cambia o [place].  Busque ayuda de inmediato si tiene dolor muy intenso en el vientre, clicos o meteorismo. Esta informacin no tiene Theme park manager el consejo del mdico. Asegrese de hacerle al mdico cualquier  pregunta que tenga. Document Revised: 07/17/2018 Document Reviewed: 07/17/2018 Elsevier Patient Education  2020 [employer].

## 2019-01-30 NOTE — MAU Note (Signed)
[facility] Sharon Hess is a 24 y.o.here in MAU reporting: lower abdominal pain for the past 2-3 months. The pain is on both sides of her abdomen. Having white discharge, foul odor, no itching.   LMP: unknown  Onset of complaint: ongoing for 2-3 months  Pain score: 10/10  Vitals:   01/30/19 1730  BP: 115/60  Pulse: 85  Resp: 16  Temp: 98.5 F (36.9 C)  SpO2: 100%     Lab orders placed from triage: none

## 2019-01-30 NOTE — MAU Provider Note (Signed)
History     CSN: [id number]  Arrival date and time: 01/30/19 1651  Chief Complaint  Patient presents with  . Abdominal Pain  . Vaginal Discharge   24 y.o. G5P2 @unknown  gestation sent from clinic for LAP. Pt reports 2-3 month hx of bilateral LAP. Describes as sharp and rates 10/10. She has tried Tylenol but it has not helped. Reports dysuria and urinary frequency. Denies VB or discharge but reports malodor. No fevers or chills.    OB History    Gravida  5   Para  3   Term  2   Preterm  0   AB  0   Living  2     SAB  0   TAB  0   Ectopic  0   Multiple  1   Live Births  2           Past Medical History:  Diagnosis Date  . Fatty liver   . Hepatitis B   . No pertinent past medical history   . Previous cesarean delivery, antepartum condition or complication 02/16/2014   OP arrest at 7 cm--desires TOLAC--consent signed     Past Surgical History:  Procedure Laterality Date  . ABDOMINAL SURGERY     c-section  . CESAREAN SECTION    . CESAREAN SECTION  08/21/2011   Procedure: CESAREAN SECTION;  Surgeon: 08/23/2011, MD;  Location: [facility];  Service: Gynecology;  Laterality: N/A;  Primary cesarean section of baby girl  at 0644  APGAR 9/9    Family History  Problem Relation Age of Onset  . Alcohol abuse Neg Hx   . Arthritis Neg Hx   . Asthma Neg Hx   . Birth defects Neg Hx   . Cancer Neg Hx   . COPD Neg Hx   . Depression Neg Hx   . Diabetes Neg Hx   . Drug abuse Neg Hx   . Early death Neg Hx   . Hearing loss Neg Hx   . Heart disease Neg Hx   . Hyperlipidemia Neg Hx   . Hypertension Neg Hx   . Kidney disease Neg Hx   . Learning disabilities Neg Hx   . Mental illness Neg Hx   . Mental retardation Neg Hx   . Miscarriages / Stillbirths Neg Hx   . Stroke Neg Hx   . Vision loss Neg Hx     Social History   Tobacco Use  . Smoking status: Never Smoker  . Smokeless tobacco: Never Used  Substance Use Topics  . Alcohol use: No  . Drug use: No     Allergies: No Known Allergies  No medications prior to admission.    Review of Systems  Constitutional: Negative for chills and fever.  Gastrointestinal: Positive for abdominal pain. Negative for constipation, diarrhea, nausea and vomiting.  Genitourinary: Positive for dysuria and frequency. Negative for urgency, vaginal bleeding and vaginal discharge.   Physical Exam   Blood pressure 128/72, pulse 88, temperature 98.9 F (37.2 C), temperature source Oral, resp. rate 16, weight 94.5 kg, SpO2 100 %, currently breastfeeding.  Physical Exam  Nursing note and vitals reviewed. Constitutional: She is oriented to person, place, and time. She appears well-developed and well-nourished. No distress.  HENT:  Head: Normocephalic and atraumatic.  Cardiovascular: Normal rate.  Respiratory: Effort normal. No respiratory distress.  GI: Soft. She exhibits no distension and no mass. There is abdominal tenderness in the suprapubic area and left lower quadrant. There is  no rebound and no guarding.  Genitourinary:    Genitourinary Comments: External: no lesions or erythema Uterus: + enlarged, anteverted, non tender, no CMT Adnexae: no masses, no tenderness left, no tenderness right Cervix closed/long    Musculoskeletal:        General: Normal range of motion.     Cervical back: Normal range of motion.  Neurological: She is alert and oriented to person, place, and time.  Skin: Skin is warm and dry.  Psychiatric: She has a normal mood and affect.   Results for orders placed or performed during the hospital encounter of 01/30/19 (from the past 24 hour(s))  CBC     Status: None   Collection Time: 01/30/19  5:54 PM  Result Value Ref Range   WBC 9.8 4.0 - 10.5 K/uL   RBC 4.30 3.87 - 5.11 MIL/uL   Hemoglobin 12.8 12.0 - 15.0 g/dL   HCT 37.7 36.0 - 46.0 %   MCV 87.7 80.0 - 100.0 fL   MCH 29.8 26.0 - 34.0 pg   MCHC 34.0 30.0 - 36.0 g/dL   RDW 13.2 11.5 - 15.5 %   Platelets 249 150 - 400 K/uL    nRBC 0.0 0.0 - 0.2 %  hCG, quantitative, pregnancy     Status: Abnormal   Collection Time: 01/30/19  5:54 PM  Result Value Ref Range   hCG, Beta Chain, Quant, S 107,322 (H) <5 mIU/mL   US OB Comp Less 14 Wks  Result Date: 01/30/2019 CLINICAL DATA:  Lower abdominal pain for 2-3 months EXAM: OBSTETRIC <14 WK ULTRASOUND TECHNIQUE: Transabdominal ultrasound was performed for evaluation of the gestation as well as the maternal uterus and adnexal regions. COMPARISON:  None. FINDINGS: Intrauterine gestational sac: Single Yolk sac:  Not seen Embryo:  Visualized. Cardiac Activity: Visualized. Heart Rate: 160 bpm CRL: 82.2 mm   14 w 1 d                  [place] EDC: 07/30/2019 Subchorionic hemorrhage:  None visualized. Maternal uterus/adnexae: Ovaries are within normal limits. Left ovary measures 2.5 x 2 x 2 cm. The right ovary measures 2.7 x 1.7 x 2.3 cm. No significant free fluid. IMPRESSION: Single viable intrauterine pregnancy as above. No specific abnormality is seen Electronically Signed   By: Donavan Foil M.D.   On: 01/30/2019 18:28   MAU Course  Procedures  MDM Labs and [place] ordered and reviewed. Cultures and UA obtained in clinic today. Viable 14 wk IUP on [place], discussed findings with pt. No evidence of acute abdomen, pain likely MSK, discussed comfort measures. Stable for discharge home.   Assessment and Plan   1. [redacted] weeks gestation of pregnancy   2. Abdominal pain affecting pregnancy   3. Round ligament pain    Discharge home Follow up at [facility] to start [facility]- message sent SAB precautions Tylenol, heat prn  Allergies as of 01/30/2019   No Known Allergies     Medication List    You have not been prescribed any medications.    Spanish interpreter present  Julianne Handler, CNM 01/30/2019, 7:08 PM

## 2019-01-30 NOTE — Progress Notes (Signed)
Pt here today for pregnancy test.  Resulted positive.  With Spanish Interpreter Raquel M., pt reports that her LMP is sometime in October.  Pt reports that she was on Depo Provera injection from 2016 until last year in January and so she had no period for seven years.  Due to pt being unsure of LMP- OB US scheduled for January 14th @ 1300 and then results appt schedule at the Clinic at 1430.  Pt reports that she is feeling menstrual like cramping in lower abdomen that she rates a 4 on 0/10 pain scale.  Pt also reports that she feels something hard (as she thumps on the wall) on right side and mvmt.  Pt c/o pain with urination and some white vaginal discharge with odor.  Notified Venia Carbon, NP who recommends that pt go to MAU for an ectopic work and send urine for culture.  MAU, Holly, RN notified of pt's arrival.  Pt advised to go to MAU for evaluation. Pt verbalized understanding.   Addison Naegeli, RN 01/30/19

## 2019-01-31 LAB — CERVICOVAGINAL ANCILLARY ONLY
Bacterial Vaginitis (gardnerella): NEGATIVE
Candida Glabrata: NEGATIVE
Candida Vaginitis: NEGATIVE
Chlamydia: NEGATIVE
Comment: NEGATIVE
Comment: NEGATIVE
Comment: NEGATIVE
Comment: NEGATIVE
Comment: NEGATIVE
Comment: NORMAL
Neisseria Gonorrhea: NEGATIVE
Trichomonas: NEGATIVE

## 2019-01-31 NOTE — Progress Notes (Signed)
Chart reviewed for nurse visit. Agree with plan of care.   Duane Lope, NP 01/31/2019 8:47 AM

## 2019-02-01 LAB — URINE CULTURE, OB REFLEX

## 2019-02-01 LAB — CULTURE, OB URINE

## 2019-02-06 ENCOUNTER — Ambulatory Visit (HOSPITAL_COMMUNITY): Payer: Self-pay

## 2019-02-06 ENCOUNTER — Other Ambulatory Visit: Payer: Self-pay

## 2019-02-06 ENCOUNTER — Ambulatory Visit: Payer: Self-pay

## 2019-02-11 ENCOUNTER — Ambulatory Visit (INDEPENDENT_AMBULATORY_CARE_PROVIDER_SITE_OTHER): Payer: Self-pay | Admitting: *Deleted

## 2019-02-11 ENCOUNTER — Other Ambulatory Visit: Payer: Self-pay

## 2019-02-11 DIAGNOSIS — K760 Fatty (change of) liver, not elsewhere classified: Secondary | ICD-10-CM | POA: Insufficient documentation

## 2019-02-11 DIAGNOSIS — O99210 Obesity complicating pregnancy, unspecified trimester: Secondary | ICD-10-CM

## 2019-02-11 DIAGNOSIS — Z98891 History of uterine scar from previous surgery: Secondary | ICD-10-CM

## 2019-02-11 DIAGNOSIS — O0990 Supervision of high risk pregnancy, unspecified, unspecified trimester: Secondary | ICD-10-CM | POA: Insufficient documentation

## 2019-02-11 DIAGNOSIS — B159 Hepatitis A without hepatic coma: Secondary | ICD-10-CM | POA: Insufficient documentation

## 2019-02-11 NOTE — Patient Instructions (Signed)

## 2019-02-11 NOTE — Progress Notes (Signed)
I connected with  Sharon Hess on 02/11/19 at 10:30 AM EST by telephone and verified that I am speaking with the correct person using two identifiers.   I discussed the limitations, risks, security and privacy concerns of performing an evaluation and management service by telephone and the availability of in person appointments. I also discussed with the patient that there may be a patient responsible charge related to this service. The patient expressed understanding and agreed to proceed.  Explained I am completing her New OB Intake today. We discussed Her EDD and that it is based on  sure LMP . I reviewed her allergies, meds, OB History, Medical /Surgical history, and appropriate screenings. I informed her of [facility] services.  I explained we will have her take her blood pressure weekly and asked if she  has a blood pressure cuff- she does not have a blood pressure cuff. I explained we will give her  a blood pressure cuff when she comes to her ob visit. Explained  then we will have her take her blood pressure weekly and write it down and report it to [place] at next visit. I . Explained she will have some visits in office and some virtually. I offered to assist her with downloading an app but she would like to wait and have help when she is in the office tomorrow.  I reviewed her new ob  appointment date/ time with her , our location and to wear mask, no visitors.  I explained she will have a pelvic exam, ob bloodwork, hemoglobin a1C, cbg , genetic testing if desired,- she does not  want a panorama,  pap if needed. I scheduled an [place] at 19 weeks and gave her the appointment. She voices understanding.   Mikhaila Roh,RN 02/11/2019  10:26 AM

## 2019-02-11 NOTE — Progress Notes (Signed)
Patient seen and assessed by nursing staff during this encounter. I have reviewed the chart and agree with the documentation and plan.  Karmelo Bass, PA-C 02/11/2019 12:16 PM    

## 2019-02-12 ENCOUNTER — Encounter: Payer: Self-pay | Admitting: Medical

## 2019-02-12 ENCOUNTER — Ambulatory Visit (INDEPENDENT_AMBULATORY_CARE_PROVIDER_SITE_OTHER): Payer: Self-pay | Admitting: Medical

## 2019-02-12 VITALS — BP 112/63 | Wt 208.0 lb

## 2019-02-12 DIAGNOSIS — K760 Fatty (change of) liver, not elsewhere classified: Secondary | ICD-10-CM

## 2019-02-12 DIAGNOSIS — Z23 Encounter for immunization: Secondary | ICD-10-CM

## 2019-02-12 DIAGNOSIS — Z98891 History of uterine scar from previous surgery: Secondary | ICD-10-CM

## 2019-02-12 DIAGNOSIS — O98412 Viral hepatitis complicating pregnancy, second trimester: Secondary | ICD-10-CM

## 2019-02-12 DIAGNOSIS — O0992 Supervision of high risk pregnancy, unspecified, second trimester: Secondary | ICD-10-CM

## 2019-02-12 DIAGNOSIS — Z3A16 16 weeks gestation of pregnancy: Secondary | ICD-10-CM

## 2019-02-12 DIAGNOSIS — O99212 Obesity complicating pregnancy, second trimester: Secondary | ICD-10-CM

## 2019-02-12 DIAGNOSIS — B159 Hepatitis A without hepatic coma: Secondary | ICD-10-CM

## 2019-02-12 DIAGNOSIS — O0990 Supervision of high risk pregnancy, unspecified, unspecified trimester: Secondary | ICD-10-CM

## 2019-02-12 DIAGNOSIS — B1910 Unspecified viral hepatitis B without hepatic coma: Secondary | ICD-10-CM

## 2019-02-12 NOTE — Progress Notes (Signed)
BP cuff given to patient and provided instructions for use & when to report BP to office

## 2019-02-12 NOTE — Progress Notes (Signed)
   PRENATAL VISIT NOTE  Subjective:  Sharon Hess is a 24 y.o. G3P2002 at [redacted]w[redacted]d being seen today for her first prenatal visit for this pregnancy.  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  Patient reports round ligament pain.  Contractions: Not present. Vag. Bleeding: None.  Movement: Absent. Denies leaking of fluid.   She is planning to both breat and bottle feed. Desires Depo Provera for contraception.   The following portions of the patient's history were reviewed and updated as appropriate: allergies, current medications, past family history, past medical history, past social history, past surgical history and problem list.   Objective:   Vitals:   02/12/19 1344  BP: 112/63  Weight: 208 lb (94.3 kg)    Fetal Status: Fetal Heart Rate (bpm): 155   Movement: Absent     General:  Alert, oriented and cooperative. Patient is in no acute distress.  Skin: Skin is warm and dry. No rash noted.   Cardiovascular: Normal heart rate and rhythm noted  Respiratory: Normal respiratory effort, no problems with respiration noted. Clear to auscultation.   Abdomen: Soft, gravid, appropriate for gestational age. Normal bowel sounds. Non-tender. Pain/Pressure: Absent     Pelvic: Cervical exam deferred        Extremities: Normal range of motion.  Edema: None  Mental Status: Normal mood and affect. Normal behavior. Normal judgment and thought content.   Assessment and Plan:  Pregnancy: G3P2002 at [redacted]w[redacted]d 1. Supervision of high risk pregnancy, antepartum - Obstetric Panel, Including HIV - Flu Vaccine QUAD 36+ mos IM   2.  Obesity affecting pregnancy in second trimester - Hemoglobin  A1c - Advised patient to start 81 mg ASA daily   3. Fatty liver disease, non-alcoholic   4. History of cesarean section - C/S with first pregnancy for non-reasuring FHR, VBAC with second, planning TOLAC  5. 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 Please refer to After Visit Summary for other counseling recommendations.   Return in about 4 weeks (around 03/12/2019) for [facility], In-Person.  Future Appointments  Date Time Provider Department Center  03/05/2019  9:00 AM [facility]FC [place] 3 [facility]-MFCUS M[facility]-US  03/05/2019  9:10 AM [facility] NURSE [facility] MFC-US  03/19/2019 10:55 AM Adam Phenix, MD [facility]    Vonzella Nipple, PA-C

## 2019-02-12 NOTE — Patient Instructions (Signed)
AREA PEDIATRIC/FAMILY PRACTICE PHYSICIANS  [place] ([address]) . [facility] o Chambliss, MD; Eniola, MD; Hale, MD; Hensel, MD; McDiarmid, MD; McIntyer, MD; Neal, MD; Walden, MD o [address] [place], [place] [address] o [phone] o Mon-Fri 8:30-12:30, 1:30-5:00 o Providers come to see babies at [facility] o Accepting Medicaid . [facility] o Limited providers who accept newborns: Koirala, MD; Morrow, MD; Wolters, MD o [address], [place], [place] [address] o [phone] o Mon-Fri 8:00-5:30 o Babies seen by providers at [facility] o Does NOT accept Medicaid o Please call early in hospitalization for appointment (limited availability)  . [facility] o [place], MD o [address] [place], [place] [address] o [phone] o Mon, Tue, Thur, Fri 8:30-5:00, Wed 10:00-7:00 (closed 1-2pm) o Babies seen by [facility] providers o Accepting Medicaid . [facility] o Rubin, MD o [address], [place], [place] [address] o [phone] o Mon-Fri 8:30-5:00, Sat 8:30-12:00 o Provider comes to see babies at [facility] o Accepting Medicaid o Must have been referred from current patients or contacted office prior to delivery . [facility] ([facility]) o Brown, MD; Chandler, MD; Ettefagh, MD; Grant, MD; Lester, MD; McCormick, MD; McQueen, MD; Prose, MD; Simha, MD; Stanley, MD; Stryffeler, NP; Tebben, NP o [address], [place], [place] [address] o [phone] o Mon, Tue, Thur, Fri 8:30-5:30, Wed 9:30-5:30, Sat 8:30-12:30 o Babies seen by [facility] providers o Accepting Medicaid o Only accepting infants of first-time parents or siblings of current patients o Hospital discharge coordinator will make follow-up appointment . Jack Amos o [address],  [place], [place]  [address] o [phone]   Fax - [phone] . [facility] o [address], [place], [place]  [address] o Phone - [phone]   Fax - [phone] . Shilpa Gosrani o [address], [place], [place]  [address] o [phone]  [place] ([address]) . [facility] o Bates, MD; [place], MD; Cooper, Cox, MD; MD; Davis, MD; Dovico, MD; Ettefaugh, MD; Little, MD; Lowe, MD; Keiffer, MD; Melvin, MD; Sumner, MD; Williams, MD o [address], [place], [place] [address] o [phone] o Mon-Fri 8:30-5:00 (extended evenings Mon-Thur as needed), Sat-Sun 10:00-1:00 o Providers come to see babies at [facility] o Accepting Medicaid for families of first-time babies and families with all children in the household age 3 and under. Must register with office prior to making appointment (M-F only). . [facility] o Henson, NP; Knapp, MD; Lalonde, MD; Tysinger, PA o [address] [place], [place] [address] o [phone] o Mon-Fri 8:00-5:00 o Babies seen by providers at [facility] o Does NOT accept Medicaid/Commercial Insurance Only . [facility] ([facility])  o Artis, MD; Barnes, MD; Bratton, MD; Coccaro, MD; Lockett Gardner, MD; Kramer, MD; Marshall, MD; Netherton, MD; Poleto, MD; Skinner, MD o [address] [place], [place] [address] o [phone] o Mon-Fri 8:30-5:30, Sat (Oct.-Mar.) 9:00-1:00 o Babies seen by providers at [facility] o Accepting Medicaid  [place] ([address]) . [facility] o Reid, MD; Warner, MD o [address], , [place] [address] o [phone] o Mon-Fri 8:30-5:00, Sat 8:30-12:00 o Providers come to see babies at [facility] o Does NOT accept Medicaid . [facility] o Becker, PA; Hagler, MD; Scifres, PA; Sun, MD; Swayne, MD o [address],  [place], [place] [address] o [phone] o Mon-Fri 8:00-5:00 o Babies seen by providers at [facility] o Does NOT accept Medicaid o Only accepting babies of parents who are patients o Please call early in hospitalization for appointment (limited availability) . [facility] o Clark, MD; Frye, MD; Kelleher, MD; Mack, NP; Miller, MD; O'Keller, MD; Patterson, NP; Pudlo, MD; Puzio, MD; Thomas, MD; Tucker, MD; Twiselton, MD o [address], [place], [place] [address] o [phone] o Mon-Fri 8:00-5:00, Sat 9:00-12:00 o Providers come to see babies at [facility] o Does NOT accept Medicaid  [place] ([address]) . [facility] o Limited providers accepting new patients: Brake, NP; Wharton, PA o [address], [place], [place] [address] o [phone] o Mon-Fri 8:00-5:00 o Babies seen by providers at [facility] o Does NOT accept Medicaid o Only accepting babies of parents who are patients o Please call early in hospitalization for appointment (limited availability) . [facility] o Gay, MD; Quinlan, MD o [address] [place], [place] [address] o [phone] (press 1 to schedule appointment) o Mon-Fri 8:00-5:00 o Providers come to see babies at [facility] o Does NOT accept Medicaid . [facility] o Mazer, MD o [address] [place], [place] [address] o [phone] o Mon-Fri 8:30-5:00 (lunch 12:30-1:00), extended hours by appointment only Wed 5:00-6:30 o Babies seen by [facility] providers o Accepting Medicaid . [facility] o Banks, MD; Jordan, MD; Koberlein, MD o [address], [place], [place] [address] o [phone] o Mon-Fri 8:00-5:00 o Babies seen by [facility] providers o Does NOT accept Medicaid . [facility] o Parker, MD; Hunter, MD; Wallace, DO o [address] [place], [place]  [address] o [phone] o Mon-Fri 8:00-5:00 o Babies seen by [facility] providers o Does NOT accept Medicaid . [facility] o Brandon, PA; Brecken, PA; Christy, NP; Dees, MD; DeClaire, MD; DeWeese, MD; Hansen, NP; Mills, NP; Parrish, NP; Smoot, NP; Summer, MD; Vapne, MD o [address] [place], [place] [address] o [phone] o Mon-Fri 8:30-5:00, Sat 10:00-1:00 o Providers come to see babies at [facility] o Does NOT accept Medicaid o Free prenatal information session Tuesdays at 4:45pm . [facility] o Bouska, MD; Gordon, PA; Jeffery, PA; Weber, PA o [address] [place] [address] o [phone] o Mon-Fri 7:30-5:30 o Babies seen by [facility] providers . [facility] o [address], [place], [place]  [address] o [phone]   Fax - [phone]  [place] ([address] & [address]) . [facility] o [place]ese, MD o [address] [place], [place] [address] o [phone] o Mon-Thur 8:00-6:00 o Providers come to see babies at [facility] o Accepting Medicaid . [facility] o Anderson, NP; Badger, MD; Beal, PA; Spencer, PA o [address] [place], [place] [address] o [phone] o Mon-Thur 7:30-7:30, Fri 7:30-4:30 o Babies seen by [facility] providers o Accepting Medicaid . [facility] o Agbuya, MD; Klett, NP; Romgoolam, MD o [address], [place],  [address] o [phone] o Mon-Fri 8:30-5:00, Sat 8:30-12:00 o Providers come to see babies at [facility] o Accepting Medicaid o Must have "Meet & Greet" appointment at office prior to delivery . [facility] o McCord,   MD; Wallace, MD; Wood, MD o [address], [place], [place] [address] o [phone] o Mon-Wed 8:00-6:00, Thur-Fri 8:00-5:00, Sat 9:00-12:00 o Providers come to  see babies at [facility] o Does NOT accept Medicaid o Only accepting siblings of current patients . [facility]  o [address], [place], [place]  [address] o [phone]   Fax - [phone] . [facility] o [address], [place], [place]  [address] o [phone]   Fax - [phone]  [place] ([address]) . [facility] o Cirigliano, DO; Matthews, DO o [address] [place], [place] [address] o [phone] o Mon-Fri 7:00-5:00 o Babies seen by [facility] providers o Does NOT accept Medicaid . [facility] o Briscoe, MD; Howley, PA; Moreira, PA o [address], [place], [place] [address] o [phone] o Mon-Fri 8:00-5:00 o Babies seen by [facility] providers o Accepting Medicaid . [facility] o Boyd, MD; Church, PA; Jones, NP; Osborn, PA o [address], [place], [place] [address] o [phone] o Mon-Fri 8:00-5:00 o Babies seen by providers at [facility] o Accepting Medicaid  [place] ([address]) . [facility] [place] o [place], DO o [address] [place], [place] [address] o [phone] o Mon-Fri 8:00-5:00 o Babies seen by [facility] providers o Does NOT accept Medicaid o Limited availability, please call early in hospitalization to schedule follow-up . [facility] o Calderon, PA; Cummings, MD; Dillard, MD; Martin, PA; Olson, MD; VanDeven, PA o [address], [place], [place] [address] o [phone] o Mon-Fri 8:30-5:00, Sat 9:00-12:00 o Babies seen by providers at [facility] o Accepting Medicaid o Please register online then schedule online or call office o [email or web address] . [facility] o Hunter, NP; Kumar, MD; Martin Rogers, PA o [address], [place], [place] [address] o [phone] o Mon-Fri 8:00-5:00 o Babies seen by providers at [facility] o Accepting Medicaid . [facility] ([facility] o [place], MD; Kristi Fleenor, NP; West, MD o [address], [place], [place] [address] o [phone] o Mon-Fri 8:00-5:30, Sat&Sun by appointment (phones open at 8:30) o Babies seen by [facility] providers o Accepting Medicaid o Must be a first-time baby or sibling of current patient . [facility] [place]  o [address], [place], [place]  [address] o [phone]   Fax - [phone]  [place] ([address]) . [facility] o Brown, PA; Cowen, PA; Rice, MD; Helton, PA; Spry, MD o [address] [place], [place] [address] o [phone] o Mon-Thur 8:00-7:00, Fri 8:00-5:00, Sat 8:00-12:00, Sun 9:00-12:00 o Babies seen by [facility] providers o Accepting Medicaid . [facility] o Coe-Goins, MD; Marshall, MD; Pierre-Louis, MD o [address] [place] [address], [place],  [address] o [phone] o Mon-Thur 8:00-5:00 o Babies seen by providers at [facility] o Accepting Medicaid . [facility] o Bratton, MD; Coe-Goins, MD; Hayes, MD; Lewis, MD; List, MD; Lott, MD; Marshall, MD; Moran, MD; O'Neal, MD; Pierre-Louis, MD; Pitonzo, MD; Scholer, MD; Spangle, MD o [address] [place],    [address] o [phone] o Mon-Fri 8:00-5:30, Sat (Oct.-Mar.) 9:00-1:00 o Babies seen by providers at [facility] o Accepting Medicaid o Must fill out new patient packet, available online at [email or web address] . [facility] ([facility] o Friddle, NP; Harris, NP; Kelly, NP; [place], MD;  Melvin, PA; Poth, MD; Ramadoss, MD; Stanton, NP o [address], [place], [place] [address] o [phone] o Mon-Thur 8:00-5:30, Fri 8:00-5:00 o Babies seen by providers at [facility] o Accepting Medicaid  [place] ([address]) . [facility] o Dixon, PA; [place], MD; Pickard, MD; Tapia, PA o [address], [place], [place] [address] o [phone] o Mon-Fri 8:00-5:00 o Babies seen by providers at [facility] o Accepting Medicaid   [place] ([address]) . [facility] [place] o Masneri, DO; Meyers, MD; Nelson, PA o [address], [place], [place] [address] o [phone] o Mon-Fri 8:00-5:00 o Babies seen by providers at [facility] o Does NOT accept Medicaid o Limited appointment availability, please call early in hospitalization  . [facility] [place] o Kunedd, DO; McGowen, MD o [address], [place], [place] [address] o [phone] o Mon-Fri 8:00-5:00 o Babies seen by [facility] providers o Does NOT accept Medicaid . [facility] [place] o Cameron, MD; MacDonald, MD; Michaels, PA; Nayak, MD o [address] [place] [address], [place], [place] [address] o [phone] o Mon-Fri 8:00-5:00 o After hours clinic ([address] [place], [place] [address]) [phone] Mon-Fri 5:00-8:00, Sat 12:00-6:00, Sun 10:00-4:00 o Babies seen by [facility] providers o Accepting Medicaid . [facility] [place] o [address], [place], [place]  [address] o [phone]   Fax - [phone]  [place] ([address]) .  [facility] [place] [facility] o Andy, MD o [address], [place], [place] [address] o [phone] o Mon-Fri 8:00-5:00 o Babies seen by [facility] providers o Does NOT accept Medicaid . [facility] [place] ([facility] [place]) o Eksir, MD o [address], [place], [place]  [address] o [phone] o Mon-Thur 8:00-7:00, Fri 8:00-5:00, Sat 8:00-12:00 o Babies seen by providers at [facility] o Accepting Medicaid - but does not have vaccinations in office (must be received elsewhere) o Limited availability, please call early in hospitalization  [place] ([address]) . [facility]  o Charlene Flemming, MD o [address], [place] [address] o [phone]  Fax [phone]   

## 2019-02-13 LAB — OBSTETRIC PANEL, INCLUDING HIV
Antibody Screen: NEGATIVE
Basophils Absolute: 0 10*3/uL (ref 0.0–0.2)
Basos: 0 %
EOS (ABSOLUTE): 0.7 10*3/uL — ABNORMAL HIGH (ref 0.0–0.4)
Eos: 6 %
HIV Screen 4th Generation wRfx: NONREACTIVE
Hematocrit: 38.9 % (ref 34.0–46.6)
Hemoglobin: 12.9 g/dL (ref 11.1–15.9)
Hepatitis B Surface Ag: NEGATIVE
Immature Grans (Abs): 0.1 10*3/uL (ref 0.0–0.1)
Immature Granulocytes: 1 %
Lymphocytes Absolute: 2.2 10*3/uL (ref 0.7–3.1)
Lymphs: 19 %
MCH: 29.6 pg (ref 26.6–33.0)
MCHC: 33.2 g/dL (ref 31.5–35.7)
MCV: 89 fL (ref 79–97)
Monocytes Absolute: 0.8 10*3/uL (ref 0.1–0.9)
Monocytes: 7 %
Neutrophils Absolute: 7.7 10*3/uL — ABNORMAL HIGH (ref 1.4–7.0)
Neutrophils: 67 %
Platelets: 250 10*3/uL (ref 150–450)
RBC: 4.36 x10E6/uL (ref 3.77–5.28)
RDW: 13.2 % (ref 11.7–15.4)
RPR Ser Ql: NONREACTIVE
Rh Factor: POSITIVE
Rubella Antibodies, IGG: 1.36 index (ref >0.99)
WBC: 11.5 10*3/uL — ABNORMAL HIGH (ref 3.4–10.8)

## 2019-02-13 LAB — HEMOGLOBIN A1C
Est. average glucose Bld gHb Est-mCnc: 100 mg/dL
Hgb A1c MFr Bld: 5.1 % (ref 4.8–5.6)

## 2019-03-05 ENCOUNTER — Other Ambulatory Visit (HOSPITAL_COMMUNITY): Payer: Self-pay | Admitting: *Deleted

## 2019-03-05 ENCOUNTER — Ambulatory Visit (HOSPITAL_COMMUNITY): Payer: Self-pay

## 2019-03-05 ENCOUNTER — Ambulatory Visit (HOSPITAL_COMMUNITY): Payer: Self-pay | Admitting: *Deleted

## 2019-03-05 ENCOUNTER — Other Ambulatory Visit: Payer: Self-pay

## 2019-03-05 ENCOUNTER — Encounter (HOSPITAL_COMMUNITY): Payer: Self-pay

## 2019-03-05 ENCOUNTER — Other Ambulatory Visit (HOSPITAL_COMMUNITY): Payer: Self-pay

## 2019-03-05 ENCOUNTER — Ambulatory Visit (HOSPITAL_COMMUNITY)
Admission: RE | Admit: 2019-03-05 | Discharge: 2019-03-05 | Disposition: A | Discharge status: Home / Self Care | Payer: Self-pay | Source: Ambulatory Visit | Attending: Medical | Admitting: Medical

## 2019-03-05 DIAGNOSIS — O0990 Supervision of high risk pregnancy, unspecified, unspecified trimester: Secondary | ICD-10-CM

## 2019-03-05 DIAGNOSIS — Z98891 History of uterine scar from previous surgery: Secondary | ICD-10-CM | POA: Insufficient documentation

## 2019-03-05 DIAGNOSIS — Z3A19 19 weeks gestation of pregnancy: Secondary | ICD-10-CM

## 2019-03-05 DIAGNOSIS — K760 Fatty (change of) liver, not elsewhere classified: Secondary | ICD-10-CM

## 2019-03-05 DIAGNOSIS — O2692 Pregnancy related conditions, unspecified, second trimester: Secondary | ICD-10-CM

## 2019-03-05 DIAGNOSIS — O99212 Obesity complicating pregnancy, second trimester: Secondary | ICD-10-CM | POA: Insufficient documentation

## 2019-03-05 DIAGNOSIS — O34219 Maternal care for unspecified type scar from previous cesarean delivery: Secondary | ICD-10-CM

## 2019-03-05 DIAGNOSIS — Z362 Encounter for other antenatal screening follow-up: Secondary | ICD-10-CM

## 2019-03-05 DIAGNOSIS — O99210 Obesity complicating pregnancy, unspecified trimester: Secondary | ICD-10-CM

## 2019-03-19 ENCOUNTER — Other Ambulatory Visit: Payer: Self-pay

## 2019-03-19 ENCOUNTER — Ambulatory Visit (INDEPENDENT_AMBULATORY_CARE_PROVIDER_SITE_OTHER): Payer: Self-pay | Admitting: Obstetrics & Gynecology

## 2019-03-19 VITALS — BP 116/69 | HR 104 | Wt 214.9 lb

## 2019-03-19 DIAGNOSIS — B1910 Unspecified viral hepatitis B without hepatic coma: Secondary | ICD-10-CM

## 2019-03-19 DIAGNOSIS — Z3A21 21 weeks gestation of pregnancy: Secondary | ICD-10-CM

## 2019-03-19 DIAGNOSIS — O98412 Viral hepatitis complicating pregnancy, second trimester: Secondary | ICD-10-CM

## 2019-03-19 DIAGNOSIS — K760 Fatty (change of) liver, not elsewhere classified: Secondary | ICD-10-CM

## 2019-03-19 DIAGNOSIS — O0990 Supervision of high risk pregnancy, unspecified, unspecified trimester: Secondary | ICD-10-CM

## 2019-03-19 NOTE — Patient Instructions (Signed)

## 2019-03-19 NOTE — Progress Notes (Signed)
   PRENATAL VISIT NOTE  Subjective:  Sharon Hess is a 24 y.o. G3P2002 at [redacted]w[redacted]d being seen today for ongoing prenatal care.  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  Patient reports heartburn.  Contractions: Not present. Vag. Bleeding: None.  Movement: Present. Denies leaking of fluid.   The following portions of the patient's history were reviewed and updated as appropriate: allergies, current medications, past family history, past medical history, past social history, past surgical history and problem list.   Objective:   Vitals:   03/19/19 1052  BP: 116/69  Pulse: (!) 104  Weight: 214 lb 14.4 oz (97.5 kg)    Fetal Status: Fetal Heart Rate (bpm): 143   Movement: Present     General:  Alert, oriented and cooperative. Patient is in no acute distress.  Skin: Skin is warm and dry. No rash noted.   Cardiovascular: Normal heart rate noted  Respiratory: Normal respiratory effort, no problems with respiration noted  Abdomen: Soft, gravid, appropriate for gestational age.  Pain/Pressure: Absent     Pelvic: Cervical exam deferred        Extremities: Normal range of motion.     Mental Status: Normal mood and affect. Normal behavior. Normal judgment and thought content.   Assessment and Plan:  Pregnancy: G3P2002 at [redacted]w[redacted]d 1. Supervision of high risk pregnancy, antepartum F/u US 2 weeks to complete anatomy  2. Hepatitis B affecting pregnancy in second trimester, antepartum Not a carrier  3. Fatty liver disease, nonalcoholic Repeat LFT when 2 hr GTT is done  Preterm labor symptoms and general obstetric precautions including but not limited to vaginal bleeding,  contractions, leaking of fluid and fetal movement were reviewed in detail with the patient. Please refer to After Visit Summary for other counseling recommendations.   Return in about 4 weeks (around 04/16/2019) for virtual.  Future Appointments  Date Time Provider Department Center  04/02/2019  2:15 PM [facility]-MFC NURSE WH-MFC MFC-US  04/02/2019  2:15 PM [facility]FC [place] 4 WH-MFCUS MFC-US  04/18/2019 11:15 AM Levie Heritage, DO [facility] WOC    Scheryl Darter, MD

## 2019-04-02 ENCOUNTER — Ambulatory Visit (HOSPITAL_COMMUNITY)
Admission: RE | Admit: 2019-04-02 | Discharge: 2019-04-02 | Disposition: A | Discharge status: Home / Self Care | Payer: Self-pay | Source: Ambulatory Visit | Attending: Obstetrics and Gynecology | Admitting: Obstetrics and Gynecology

## 2019-04-02 ENCOUNTER — Other Ambulatory Visit: Payer: Self-pay

## 2019-04-02 ENCOUNTER — Encounter (HOSPITAL_COMMUNITY): Payer: Self-pay

## 2019-04-02 ENCOUNTER — Ambulatory Visit (HOSPITAL_COMMUNITY): Payer: Self-pay | Admitting: *Deleted

## 2019-04-02 DIAGNOSIS — O0990 Supervision of high risk pregnancy, unspecified, unspecified trimester: Secondary | ICD-10-CM

## 2019-04-02 DIAGNOSIS — Z98891 History of uterine scar from previous surgery: Secondary | ICD-10-CM

## 2019-04-02 DIAGNOSIS — O99212 Obesity complicating pregnancy, second trimester: Secondary | ICD-10-CM

## 2019-04-02 DIAGNOSIS — Z3A23 23 weeks gestation of pregnancy: Secondary | ICD-10-CM

## 2019-04-02 DIAGNOSIS — Z362 Encounter for other antenatal screening follow-up: Secondary | ICD-10-CM | POA: Insufficient documentation

## 2019-04-16 ENCOUNTER — Telehealth: Payer: Self-pay | Admitting: Family Medicine

## 2019-04-16 NOTE — Telephone Encounter (Signed)
Called the patient to inform of the changes in the upcoming appointment. Received a message the person you are trying to reach has a voicemail box that is not setup yet. Mailing an appointment letter to inform of the change.

## 2019-04-18 ENCOUNTER — Telehealth: Payer: Self-pay | Admitting: Family Medicine

## 2019-04-28 ENCOUNTER — Telehealth (INDEPENDENT_AMBULATORY_CARE_PROVIDER_SITE_OTHER): Payer: Self-pay | Admitting: Family Medicine

## 2019-04-28 DIAGNOSIS — O98412 Viral hepatitis complicating pregnancy, second trimester: Secondary | ICD-10-CM

## 2019-04-28 DIAGNOSIS — O0990 Supervision of high risk pregnancy, unspecified, unspecified trimester: Secondary | ICD-10-CM

## 2019-04-28 DIAGNOSIS — Z98891 History of uterine scar from previous surgery: Secondary | ICD-10-CM

## 2019-04-28 DIAGNOSIS — O99212 Obesity complicating pregnancy, second trimester: Secondary | ICD-10-CM

## 2019-04-28 DIAGNOSIS — B1910 Unspecified viral hepatitis B without hepatic coma: Secondary | ICD-10-CM

## 2019-04-28 DIAGNOSIS — O34219 Maternal care for unspecified type scar from previous cesarean delivery: Secondary | ICD-10-CM

## 2019-04-28 DIAGNOSIS — Z603 Acculturation difficulty: Secondary | ICD-10-CM

## 2019-04-28 DIAGNOSIS — Z789 Other specified health status: Secondary | ICD-10-CM | POA: Insufficient documentation

## 2019-04-28 DIAGNOSIS — E669 Obesity, unspecified: Secondary | ICD-10-CM

## 2019-04-28 DIAGNOSIS — Z3A26 26 weeks gestation of pregnancy: Secondary | ICD-10-CM

## 2019-04-28 NOTE — Progress Notes (Signed)
I connected with  Sharon Hess [place] on 04/28/19 at 0913 by telephone with interpreter Hoover Brunette and verified that I am speaking with the correct person using two identifiers.   I discussed the limitations, risks, security and privacy concerns of performing an evaluation and management service by telephone and the availability of in person appointments. I also discussed with the patient that there may be a patient responsible charge related to this service. The patient expressed understanding and agreed to proceed.  Marjo Bicker, RN 04/28/2019  9:13 AM

## 2019-04-28 NOTE — Patient Instructions (Signed)
 Lactancia materna Breastfeeding  Decidir amamantar es una de las mejores elecciones que puede hacer por usted y su beb. Un cambio en las hormonas durante el embarazo hace que las mamas produzcan leche materna en las glndulas productoras de leche. Las hormonas impiden que la leche materna sea liberada antes del nacimiento del beb. Adems, impulsan el flujo de leche luego del nacimiento. Una vez que ha comenzado a amamantar, pensar en el beb, as como la succin o el llanto, pueden estimular la liberacin de leche de las glndulas productoras de leche. Los beneficios de amamantar Las investigaciones demuestran que la lactancia materna ofrece muchos beneficios de salud para bebs y madres. Adems, ofrece una forma gratuita y conveniente de alimentar al beb. Para el beb  La primera leche (calostro) ayuda a mejorar el funcionamiento del aparato digestivo del beb.  Las clulas especiales de la leche (anticuerpos) ayudan a combatir las infecciones en el beb.  Los bebs que se alimentan con leche materna tambin tienen menos probabilidades de tener asma, alergias, obesidad o diabetes de tipo 2. Adems, tienen menor riesgo de sufrir el sndrome de muerte sbita del lactante (SMSL).  Los nutrientes de la leche materna son mejores para satisfacer las necesidades del beb en comparacin con la leche maternizada.  La leche materna mejora el desarrollo cerebral del beb. Para usted  La lactancia materna favorece el desarrollo de un vnculo muy especial entre la madre y el beb.  Es conveniente. La leche materna es econmica y siempre est disponible a la temperatura correcta.  La lactancia materna ayuda a quemar caloras. Le ayuda a perder el peso ganado durante el embarazo.  Hace que el tero vuelva al tamao que tena antes del embarazo ms rpido. Adems, disminuye el sangrado (loquios) despus del parto.  La lactancia materna contribuye a reducir el riesgo de tener diabetes de tipo 2,  osteoporosis, artritis reumatoide, enfermedades cardiovasculares y cncer de mama, ovario, tero y endometrio en el futuro. Informacin bsica sobre la lactancia Comienzo de la lactancia  Encuentre un lugar cmodo para sentarse o acostarse, con un buen respaldo para el cuello y la espalda.  Coloque una almohada o una manta enrollada debajo del beb para acomodarlo a la altura de la mama (si est sentada). Las almohadas para amamantar se han diseado especialmente a fin de servir de apoyo para los brazos y el beb mientras amamanta.  Asegrese de que la barriga del beb (abdomen) est frente a la suya.  Masajee suavemente la mama. Con las yemas de los dedos, masajee los bordes exteriores de la mama hacia adentro, en direccin al pezn. Esto estimula el flujo de leche. Si la leche fluye lentamente, es posible que deba continuar con este movimiento durante la lactancia.  Sostenga la mama con 4 dedos por debajo y el pulgar por arriba del pezn (forme la letra "C" con la mano). Asegrese de que los dedos se encuentren lejos del pezn y de la boca del beb.  Empuje suavemente los labios del beb con el pezn o con el dedo.  Cuando la boca del beb se abra lo suficiente, acrquelo rpidamente a la mama e introduzca todo el pezn y la arola, tanto como sea posible, dentro de la boca del beb. La arola es la zona de color que rodea al pezn. ? Debe haber ms arola visible por arriba del labio superior del beb que por debajo del labio inferior. ? Los labios del beb deben estar abiertos y extendidos hacia afuera (evertidos) para asegurar   que el beb se prenda de forma adecuada y cmoda. ? La lengua del beb debe estar entre la enca inferior y la mama.  Asegrese de que la boca del beb est en la posicin correcta alrededor del pezn (prendido). Los labios del beb deben crear un sello sobre la mama y estar doblados hacia afuera (invertidos).  Es comn que el beb succione durante 2 a 3 minutos  para que comience el flujo de leche materna. Cmo debe prenderse Es muy importante que le ensee al beb cmo prenderse adecuadamente a la mama. Si el beb no se prende adecuadamente, puede causar dolor en los pezones, reducir la produccin de leche materna y hacer que el beb tenga un escaso aumento de peso. Adems, si el beb no se prende adecuadamente al pezn, puede tragar aire durante la alimentacin. Esto puede causarle molestias al beb. Hacer eructar al beb al cambiar de mama puede ayudarlo a liberar el aire. Sin embargo, ensearle al beb cmo prenderse a la mama adecuadamente es la mejor manera de evitar que se sienta molesto por tragar aire mientras se alimenta. Signos de que el beb se ha prendido adecuadamente al pezn  Tironea o succiona de modo silencioso, sin causarle dolor. Los labios del beb deben estar extendidos hacia afuera (evertidos).  Se escucha que traga cada 3 o 4 succiones una vez que la leche ha comenzado a fluir (despus de que se produzca el reflejo de eyeccin de la leche).  Hay movimientos musculares por arriba y por delante de sus odos al succionar. Signos de que el beb no se ha prendido adecuadamente al pezn  Hace ruidos de succin o de chasquido mientras se alimenta.  Siente dolor en los pezones. Si cree que el beb no se prendi correctamente, deslice el dedo en la comisura de la boca y colquelo entre las encas del beb para interrumpir la succin. Intente volver a comenzar a amamantar. Signos de lactancia materna exitosa Signos del beb  El beb disminuir gradualmente el nmero de succiones o dejar de succionar por completo.  El beb se quedar dormido.  El cuerpo del beb se relajar.  El beb retendr una pequea cantidad de leche en la boca.  El beb se desprender solo del pecho. Signos que presenta usted  Las mamas han aumentado la firmeza, el peso y el tamao 1 a 3 horas despus de amamantar.  Estn ms blandas inmediatamente despus  de amamantar.  Se producen un aumento del volumen de leche y un cambio en su consistencia y color hacia el quinto da de lactancia.  Los pezones no duelen, no estn agrietados ni sangran. Signos de que su beb recibe la cantidad de leche suficiente  Mojar por lo menos 1 o 2paales durante las primeras 24horas despus del nacimiento.  Mojar por lo menos 5 o 6paales cada 24horas durante la primera semana despus del nacimiento. La orina debe ser clara o de color amarillo plido a los 5das de vida.  Mojar entre 6 y 8paales cada 24horas a medida que el beb sigue creciendo y desarrollndose.  Defeca por lo menos 3 veces en 24 horas a los 5 das de vida. Las heces deben ser blandas y amarillentas.  Defeca por lo menos 3 veces en 24 horas a los 7 das de vida. Las heces deben ser grumosas y amarillentas.  No registra una prdida de peso mayor al 10% del peso al nacer durante los primeros 3 das de vida.  Aumenta de peso un promedio de 4   a 7onzas (113 a 198g) por semana despus de los 4 das de vida.  Aumenta de peso, diariamente, de manera uniforme a partir de los 5 das de vida, sin registrar prdida de peso despus de las 2semanas de vida. Despus de alimentarse, es posible que el beb regurgite una pequea cantidad de leche. Esto es normal. Frecuencia y duracin de la lactancia El amamantamiento frecuente la ayudar a producir ms leche y puede prevenir dolores en los pezones y las mamas extremadamente llenas (congestin mamaria). Alimente al beb cuando muestre signos de hambre o si siente la necesidad de reducir la congestin de las mamas. Esto se denomina "lactancia a demanda". Las seales de que el beb tiene hambre incluyen las siguientes:  Aumento del estado de alerta, actividad o inquietud.  Mueve la cabeza de un lado a otro.  Abre la boca cuando se le toca la mejilla o la comisura de la boca (reflejo de bsqueda).  Aumenta las vocalizaciones, tales como sonidos de  succin, se relame los labios, emite arrullos, suspiros o chirridos.  Mueve la mano hacia la boca y se chupa los dedos o las manos.  Est molesto o llora. Evite el uso del chupete en las primeras 4 a 6 semanas despus del nacimiento del beb. Despus de este perodo, podr usar un chupete. Las investigaciones demostraron que el uso del chupete durante el primer ao de vida del beb disminuye el riesgo de tener el sndrome de muerte sbita del lactante (SMSL). Permita que el nio se alimente en cada mama todo lo que desee. Cuando el beb se desprende o se queda dormido mientras se est alimentando de la primera mama, ofrzcale la segunda. Debido a que, con frecuencia, los recin nacidos estn somnolientos las primeras semanas de vida, es posible que deba despertar al beb para alimentarlo. Los horarios de lactancia varan de un beb a otro. Sin embargo, las siguientes reglas pueden servir como gua para ayudarla a garantizar que el beb se alimenta adecuadamente:  Se puede amamantar a los recin nacidos (bebs de 4 semanas o menos de vida) cada 1 a 3 horas.  No deben transcurrir ms de 3 horas durante el da o 5 horas durante la noche sin que se amamante a los recin nacidos.  Debe amamantar al beb un mnimo de 8 veces en un perodo de 24 horas. Extraccin de leche materna     La extraccin y el almacenamiento de la leche materna le permiten asegurarse de que el beb se alimente exclusivamente de su leche materna, aun en momentos en los que no puede amamantar. Esto tiene especial importancia si debe regresar al trabajo en el perodo en que an est amamantando o si no puede estar presente en los momentos en que el beb debe alimentarse. Su asesor en lactancia puede ayudarla a encontrar un mtodo de extraccin que funcione mejor para usted y orientarla sobre cunto tiempo es seguro almacenar leche materna. Cmo cuidar las mamas durante la lactancia Los pezones pueden secarse, agrietarse y doler  durante la lactancia. Las siguientes recomendaciones pueden ayudarla a mantener las mamas humectadas y sanas:  Evite usar jabn en los pezones.  Use un sostn de soporte diseado especialmente para la lactancia materna. Evite usar sostenes con aro o sostenes muy ajustados (sostenes deportivos).  Seque al aire sus pezones durante 3 a 4minutos despus de amamantar al beb.  Utilice solo apsitos de algodn en el sostn para absorber las prdidas de leche. La prdida de un poco de leche materna entre   las tomas es normal.  Utilice lanolina sobre los pezones luego de amamantar. La lanolina ayuda a mantener la humedad normal de la piel. La lanolina pura no es perjudicial (no es txica) para el beb. Adems, puede extraer manualmente algunas gotas de leche materna y masajear suavemente esa leche sobre los pezones para que la leche se seque al aire. Durante las primeras semanas despus del nacimiento, algunas mujeres experimentan congestin mamaria. La congestin mamaria puede hacer que sienta las mamas pesadas, calientes y sensibles al tacto. El pico de la congestin mamaria ocurre en el plazo de los 3 a 5 das despus del parto. Las siguientes recomendaciones pueden ayudarla a aliviar la congestin mamaria:  Vace por completo las mamas al amamantar o extraer leche. Puede aplicar calor hmedo en las mamas (en la ducha o con toallas hmedas para manos) antes de amamantar o extraer leche. Esto aumenta la circulacin y ayuda a que la leche fluya. Si el beb no vaca por completo las mamas cuando lo amamanta, extraiga la leche restante despus de que haya finalizado.  Aplique compresas de hielo sobre las mamas inmediatamente despus de amamantar o extraer leche, a menos que le resulte demasiado incmodo. Haga lo siguiente: ? Ponga el hielo en una bolsa plstica. ? Coloque una toalla entre la piel y la bolsa de hielo. ? Coloque el hielo durante 20minutos, 2 o 3veces por da.  Asegrese de que el beb  est prendido y se encuentre en la posicin correcta mientras lo alimenta. Si la congestin mamaria persiste luego de 48 horas o despus de seguir estas recomendaciones, comunquese con su mdico o un asesor en lactancia. Recomendaciones de salud general durante la lactancia  Consuma 3 comidas y 3 colaciones saludables todos los das. Las madres bien alimentadas que amamantan necesitan entre 450 y 500 caloras adicionales por da. Puede cumplir con este requisito al aumentar la cantidad de una dieta equilibrada que realice.  Beba suficiente agua para mantener la orina clara o de color amarillo plido.  Descanse con frecuencia, reljese y siga tomando sus vitaminas prenatales para prevenir la fatiga, el estrs y los niveles bajos de vitaminas y minerales en el cuerpo (deficiencias de nutrientes).  No consuma ningn producto que contenga nicotina o tabaco, como cigarrillos y cigarrillos electrnicos. El beb puede verse afectado por las sustancias qumicas de los cigarrillos que pasan a la leche materna y por la exposicin al humo ambiental del tabaco. Si necesita ayuda para dejar de fumar, consulte al mdico.  Evite el consumo de alcohol.  No consuma drogas ilegales o marihuana.  Antes de usar cualquier medicamento, hable con el mdico. Estos incluyen medicamentos recetados y de venta libre, como tambin vitaminas y suplementos a base de hierbas. Algunos medicamentos, que pueden ser perjudiciales para el beb, pueden pasar a travs de la leche materna.  Puede quedar embarazada durante la lactancia. Si se desea un mtodo anticonceptivo, consulte al mdico sobre cules son las opciones seguras durante la lactancia. Dnde encontrar ms informacin: Liga internacional La Leche: www.llli.org. Comunquese con un mdico si:  Siente que quiere dejar de amamantar o se siente frustrada con la lactancia.  Sus pezones estn agrietados o sangran.  Sus mamas estn irritadas, sensibles o  calientes.  Tiene los siguientes sntomas: ? Dolor en las mamas o en los pezones. ? Un rea hinchada en cualquiera de las mamas. ? Fiebre o escalofros. ? Nuseas o vmitos. ? Drenaje de otro lquido distinto de la leche materna desde los pezones.  Sus mamas no   se llenan antes de amamantar al beb para el quinto da despus del parto.  Se siente triste y deprimida.  El beb: ? Est demasiado somnoliento como para comer bien. ? Tiene problemas para dormir. ? Tiene ms de 1 semana de vida y moja menos de 6 paales en un periodo de 24 horas. ? No ha aumentado de peso a los 5 das de vida.  El beb defeca menos de 3 veces en 24 horas.  La piel del beb o las partes blancas de los ojos se vuelven amarillentas. Solicite ayuda de inmediato si:  El beb est muy cansado (letargo) y no se quiere despertar para comer.  Le sube la fiebre sin causa. Resumen  La lactancia materna ofrece muchos beneficios de salud para bebs y madres.  Intente amamantar a su beb cuando muestre signos tempranos de hambre.  Haga cosquillas o empuje suavemente los labios del beb con el dedo o el pezn para lograr que el beb abra la boca. Acerque el beb a la mama. Asegrese de que la mayor parte de la arola se encuentre dentro de la boca del beb. Ofrzcale una mama y haga eructar al beb antes de pasar a la otra.  Hable con su mdico o asesor en lactancia si tiene dudas o problemas con la lactancia. Esta informacin no tiene como fin reemplazar el consejo del mdico. Asegrese de hacerle al mdico cualquier pregunta que tenga. Document Revised: 04/05/2017 Document Reviewed: 05/01/2016 Elsevier Patient Education  2020 [employer]  

## 2019-04-28 NOTE — Progress Notes (Signed)
   TELEPHONE VIRTUAL OBSTETRICS VISIT ENCOUNTER NOTE  I connected with Sharon Hess on 24/05/21 at  9:15 AM EDT by telephone at home and verified that I am speaking with the correct person using two identifiers.   I discussed the limitations, risks, security and privacy concerns of performing an evaluation and management service by telephone and the availability of in person appointments. I also discussed with the patient that there may be a patient responsible charge related to this service. The patient expressed understanding and agreed to proceed.  Subjective:  Sharon Hess is a 24 y.o. G3P2002 at [redacted]w[redacted]d being followed for ongoing prenatal care.  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  Patient reports no complaints. Reports fetal movement. Denies any contractions, bleeding or leaking of fluid.   The following portions of the patient's history were reviewed and updated as appropriate: allergies, current medications, past family history, past medical history, past social history, past surgical history and problem list.   Objective:   General:  Alert, oriented and cooperative.   Mental Status: Normal mood and affect perceived. Normal judgment and thought content.  Rest of physical exam deferred due to type of encounter  Assessment and Plan:  Pregnancy: G3P2002 at [redacted]w[redacted]d 1. Supervision of high risk pregnancy, antepartum 28 wk labs next with TDaP - CBC; Future - Comprehensive metabolic panel; Future - Glucose Tolerance, 2 Hours w/1 Hour; Future - HIV Antibody (routine testing w rflx); Future - RPR; Future - Tdap vaccine greater than or equal  to 24yo IM; Future  2. Obesity affecting pregnancy in second trimester Repeat CMP for LFTs  3. History of cesarean section VBAC x 1--will need to sign papers next visit  4. 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 Language barrier Live Spanish interpreter: Hoover Brunette used   Preterm labor symptoms and general obstetric precautions including but not limited to vaginal bleeding, contractions, leaking of fluid and fetal movement were reviewed in detail with the patient.  I discussed the assessment and treatment plan with the patient. The patient was provided an opportunity to ask questions and all were answered. The patient agreed with the plan and demonstrated an understanding of the instructions. The patient was advised to call back or seek an in-person office evaluation/go to [facility]U at [facility] for any urgent or concerning symptoms. Please refer to After Visit Summary for other counseling recommendations.   I provided 11 minutes of non-face-to-face time during this encounter.  Return in 2 weeks (on 05/12/2019) for in person, 28 wk labs, [facility].  No future appointments.  Reva Bores, MD [facility] [employer], [facility]

## 2019-05-12 ENCOUNTER — Other Ambulatory Visit: Payer: Self-pay

## 2019-05-12 ENCOUNTER — Ambulatory Visit (INDEPENDENT_AMBULATORY_CARE_PROVIDER_SITE_OTHER): Payer: Self-pay | Admitting: Nurse Practitioner

## 2019-05-12 VITALS — BP 128/73 | HR 100 | Wt 226.7 lb

## 2019-05-12 DIAGNOSIS — E669 Obesity, unspecified: Secondary | ICD-10-CM

## 2019-05-12 DIAGNOSIS — Z23 Encounter for immunization: Secondary | ICD-10-CM

## 2019-05-12 DIAGNOSIS — Z98891 History of uterine scar from previous surgery: Secondary | ICD-10-CM

## 2019-05-12 DIAGNOSIS — O0990 Supervision of high risk pregnancy, unspecified, unspecified trimester: Secondary | ICD-10-CM

## 2019-05-12 DIAGNOSIS — Z3A28 28 weeks gestation of pregnancy: Secondary | ICD-10-CM

## 2019-05-12 DIAGNOSIS — O99213 Obesity complicating pregnancy, third trimester: Secondary | ICD-10-CM

## 2019-05-12 DIAGNOSIS — O34219 Maternal care for unspecified type scar from previous cesarean delivery: Secondary | ICD-10-CM

## 2019-05-12 DIAGNOSIS — O99212 Obesity complicating pregnancy, second trimester: Secondary | ICD-10-CM

## 2019-05-12 NOTE — Patient Instructions (Signed)
Tercer trimestre de embarazo Third Trimester of Pregnancy  El tercer trimestre comprende desde la semana28 hasta la semana40 (desde el mes7 hasta el mes9). En este trimestre, el beb en gestacin (feto) crece muy rpidamente. Hacia el final del noveno mes, el beb en gestacin mide alrededor de 20pulgadas (45cm) de largo. Pesa entre 6y 10libras (2,70y 4,50kg). Siga estas indicaciones en su casa: Medicamentos  Tome los medicamentos de venta libre y los recetados solamente como se lo haya indicado el mdico. Algunos medicamentos son seguros para tomar durante el embarazo y otros no lo son.  Tome vitaminas prenatales que contengan por lo menos 600microgramos (?g) de cido flico.  Si tiene dificultad para mover el intestino (estreimiento), tome un medicamento para ablandar las heces (laxante) si su mdico se lo autoriza. Comida y bebida   Ingiera alimentos saludables de manera regular.  No coma carne cruda ni quesos sin cocinar.  Si obtiene poca cantidad de calcio de los alimentos que ingiere, consulte a su mdico sobre la posibilidad de tomar un suplemento diario de calcio.  La ingesta diaria de cuatro o cinco comidas pequeas en lugar de tres comidas abundantes.  Evite el consumo de alimentos ricos en grasas y azcares, como los alimentos fritos y los dulces.  Para evitar el estreimiento: ? Consuma alimentos ricos en fibra, como frutas y verduras frescas, cereales integrales y frijoles. ? Beba suficiente lquido para mantener el pis (orina) claro o de color amarillo plido. Actividad  Haga ejercicios solamente como se lo haya indicado el mdico. Interrumpa la actividad fsica si comienza a tener calambres.  No levante objetos pesados, use zapatos de tacones bajos y sintese derecha.  No haga ejercicio si hace demasiado calor, hay demasiada humedad o se encuentra en un lugar de mucha altura (altitud alta).  Puede continuar teniendo relaciones sexuales, a menos que el  mdico le indique lo contrario. Alivio del dolor y del malestar  Use un sostn que le brinde buen soporte si sus mamas estn sensibles.  Haga pausas frecuentes y descanse con las piernas levantadas si tiene calambres en las piernas o dolor en la zona lumbar.  Dese baos de asiento con agua tibia para aliviar el dolor o las molestias causadas por las hemorroides. Use una crema para las hemorroides si el mdico la autoriza.  Si desarrolla venas hinchadas y abultadas (vrices) en las piernas: ? Use medias de compresin o medias de descanso como se lo haya indicado el mdico. ? Levante (eleve) los pies durante 15minutos, 3 o 4veces por da. ? Limite el consumo de sal en sus alimentos. Seguridad  Colquese el cinturn de seguridad cuando conduzca.  Haga una lista de los nmeros de telfono de emergencia, que incluya los nmeros de telfono de familiares, amigos, el hospital, as como los departamentos de polica y bomberos. Preparacin para la llegada del beb Para prepararse para la llegada de su beb:  Tome clases prenatales.  Practique ir manejando al hospital.  Visite el hospital y recorra el rea de maternidad.  Hable en su trabajo acerca de tomar licencia cuando llegue el beb.  Prepare el bolso que llevar al hospital.  Prepare la habitacin del beb.  Concurra a los controles mdicos.  Compre un asiento de seguridad orientado hacia atrs para llevar al beb en el automvil. Aprenda cmo instalarlo en el auto. Instrucciones generales  No se d baos de inmersin en agua caliente, baos turcos ni saunas.  No consuma ningn producto que contenga nicotina o tabaco, como cigarrillos y cigarrillos   electrnicos. Si necesita ayuda para dejar de fumar, consulte al mdico.  No beba alcohol.  No se haga duchas vaginales ni use tampones o toallas higinicas perfumadas.  No mantenga las piernas cruzadas durante mucho tiempo.  No haga viajes de larga distancia, excepto si es  obligatorio. Hgalos solamente si su mdico la autoriza.  Visite a su dentista si no lo ha hecho durante el embarazo. Use un cepillo de cerdas suaves para cepillarse los dientes. Psese el hilo dental con suavidad.  Evite el contacto con las bandejas sanitarias de los gatos y la tierra que estos animales usan. Estos elementos contienen bacterias que pueden causar defectos congnitos al beb y la posible prdida del beb (aborto espontneo) o la muerte fetal.  Concurra a todas las visitas prenatales como se lo haya indicado el mdico. Esto es importante. Comunquese con un mdico si:  No est segura de si est en trabajo de parto o si ha roto la bolsa de las aguas.  Tiene mareos.  Tiene clicos leves o siente presin en la parte baja del vientre.  Sufre un dolor persistente en el abdomen.  Sigue teniendo malestar estomacal, vomita o tiene heces lquidas.  Advierte un lquido con olor ftido que proviene de la vagina.  Siente dolor al orinar. Solicite ayuda de inmediato si:  Tiene fiebre.  Tiene una prdida de lquido por la vagina.  Tiene sangrado o pequeas prdidas vaginales.  Siente dolor intenso o clicos en el abdomen.  Aumenta o baja de peso rpidamente.  Tiene dificultades para recuperar el aliento y siente dolor en el pecho.  Sbitamente se le hinchan mucho el rostro, las manos, los tobillos, los pies o las piernas.  No ha sentido los movimientos del beb durante una hora.  Siente un dolor de cabeza intenso que no se alivia con medicamentos.  Tiene dificultad para ver.  Tiene prdida de lquido o le sale un chorro de lquido de la vagina antes de estar en la semana 37.  Tiene espasmos abdominales (contracciones) regulares antes de estar en la semana 37. Resumen  El tercer trimestre comprende desde la semana28 hasta la semana40 (desde el mes7 hasta el mes9). Esta es la poca en que el beb en gestacin crece muy rpidamente.  Siga los consejos del mdico  con respecto a los medicamentos, la alimentacin y la actividad.  Preprese para la llegada del beb tomando las clases prenatales, preparando todo lo que necesitar el beb, arreglando la habitacin del beb y concurriendo a los controles mdicos.  Solicite ayuda de inmediato si tiene sangrado por la vagina, siente dolor en el pecho o tiene dificultad para respirar, o si no ha sentido que su beb se mueve en el transcurso de ms de una hora. Esta informacin no tiene como fin reemplazar el consejo del mdico. Asegrese de hacerle al mdico cualquier pregunta que tenga. Document Revised: 08/14/2016 Document Reviewed: 08/14/2016 Elsevier Patient Education  2020 Elsevier Inc.  

## 2019-05-12 NOTE — Progress Notes (Signed)
    Subjective:  Sharon Hess is a 24 y.o. G3P2002 at [redacted]w[redacted]d being seen today for ongoing prenatal care.  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  Patient reports no complaints.  Contractions: Not present. Vag. Bleeding: None.  Movement: Present. Denies leaking of fluid.   The following portions of the patient's history were reviewed and updated as appropriate: allergies, current medications, past family history, past medical history, past social history, past surgical history and problem list. Problem list updated.  Objective:   Vitals:   05/12/19 0852  BP: 128/73  Pulse: 100  Weight: 226 lb 11.2 oz (102.8 kg)    Fetal Status: Fetal Heart Rate (bpm): 148 Fundal Height: 32 cm Movement: Present     General:  Alert, oriented and cooperative. Patient is in no acute distress.  Skin: Skin is warm and dry. No rash noted.   Cardiovascular: Normal heart rate noted  Respiratory: Normal respiratory effort, no problems with respiration noted  Abdomen: Soft, gravid, appropriate for gestational age. Pain/Pressure: Absent     Pelvic:  Cervical exam deferred        Extremities: Normal range of motion.  Edema: None  Mental Status: Normal mood and affect. Normal behavior. Normal judgment and thought content.   Urinalysis:      Assessment and Plan:  Pregnancy: G3P2002 at [redacted]w[redacted]d  1. Supervision of high risk pregnancy, antepartum Baby is moving well No edema in ankles Plans for another pregnancy after the current pregnancy - advised baby needs to be 53 months old before becoming pregnant again.  - Tdap vaccine greater than or equal to 7yo IM  2. Obesity  affecting pregnancy in second trimester Fundal height measures greater than dates Glucola done today - has not had gestational diabetes in the past - will review results when completed  3. History of cesarean section Will come in person to see MD for next appointment.  Preterm labor symptoms and general obstetric precautions including but not limited to vaginal bleeding, contractions, leaking of fluid and fetal movement were reviewed in detail with the patient. Please refer to After Visit Summary for other counseling recommendations.  Return in about 2 weeks (around 05/26/2019) for In person with MD to sign TOLAC papers.  Nolene Bernheim, RN, MSN, NP-BC Nurse Practitioner, [facility] for [employer], [facility] 05/12/2019 9:19 AM

## 2019-05-13 LAB — COMPREHENSIVE METABOLIC PANEL
ALT: 10 IU/L (ref 0–32)
AST: 10 IU/L (ref 0–40)
Albumin/Globulin Ratio: 1.6 (ref 1.2–2.2)
Albumin: 3.6 g/dL — ABNORMAL LOW (ref 3.9–5.0)
Alkaline Phosphatase: 78 IU/L (ref 39–117)
BUN/Creatinine Ratio: 13 (ref 9–23)
BUN: 6 mg/dL (ref 6–20)
Bilirubin Total: 0.7 mg/dL (ref 0.0–1.2)
CO2: 17 mmol/L — ABNORMAL LOW (ref 20–29)
Calcium: 9.2 mg/dL (ref 8.7–10.2)
Chloride: 104 mmol/L (ref 96–106)
Creatinine, Ser: 0.47 mg/dL — ABNORMAL LOW (ref 0.57–1.00)
GFR calc Af Amer: 161 mL/min/{1.73_m2} (ref >59)
GFR calc non Af Amer: 140 mL/min/{1.73_m2} (ref >59)
Globulin, Total: 2.3 g/dL (ref 1.5–4.5)
Glucose: 82 mg/dL (ref 65–99)
Potassium: 3.8 mmol/L (ref 3.5–5.2)
Sodium: 137 mmol/L (ref 134–144)
Total Protein: 5.9 g/dL — ABNORMAL LOW (ref 6.0–8.5)

## 2019-05-13 LAB — GLUCOSE TOLERANCE, 2 HOURS W/ 1HR
Glucose, 1 hour: 152 mg/dL (ref 65–179)
Glucose, 2 hour: 99 mg/dL (ref 65–152)
Glucose, Fasting: 83 mg/dL (ref 65–91)

## 2019-05-13 LAB — CBC
Hematocrit: 36.8 % (ref 34.0–46.6)
Hemoglobin: 12.2 g/dL (ref 11.1–15.9)
MCH: 29.2 pg (ref 26.6–33.0)
MCHC: 33.2 g/dL (ref 31.5–35.7)
MCV: 88 fL (ref 79–97)
Platelets: 218 10*3/uL (ref 150–450)
RBC: 4.18 x10E6/uL (ref 3.77–5.28)
RDW: 13.7 % (ref 11.7–15.4)
WBC: 11.4 10*3/uL — ABNORMAL HIGH (ref 3.4–10.8)

## 2019-05-13 LAB — HIV ANTIBODY (ROUTINE TESTING W REFLEX): HIV Screen 4th Generation wRfx: NONREACTIVE

## 2019-05-13 LAB — RPR: RPR Ser Ql: NONREACTIVE

## 2019-06-02 ENCOUNTER — Ambulatory Visit (INDEPENDENT_AMBULATORY_CARE_PROVIDER_SITE_OTHER): Payer: Self-pay | Admitting: Obstetrics & Gynecology

## 2019-06-02 ENCOUNTER — Other Ambulatory Visit: Payer: Self-pay

## 2019-06-02 VITALS — BP 124/74 | HR 113 | Wt 233.3 lb

## 2019-06-02 DIAGNOSIS — Z98891 History of uterine scar from previous surgery: Secondary | ICD-10-CM

## 2019-06-02 DIAGNOSIS — O34219 Maternal care for unspecified type scar from previous cesarean delivery: Secondary | ICD-10-CM

## 2019-06-02 DIAGNOSIS — Z3A31 31 weeks gestation of pregnancy: Secondary | ICD-10-CM

## 2019-06-02 DIAGNOSIS — O0990 Supervision of high risk pregnancy, unspecified, unspecified trimester: Secondary | ICD-10-CM

## 2019-06-02 NOTE — Patient Instructions (Signed)
Regrese a la clinica cuando tenga su cita. Si tiene problemas o preguntas, llama a la clinica o vaya a la sala de emergencia al [employer].   Tercer trimestre de Psychiatrist Third Trimester of Pregnancy El tercer trimestre comprende desde la [employer] la semana40 (desde el mes7 hasta el mes9). El tercer trimestre es un perodo en el que el beb en gestacin (feto) crece rpidamente. Hacia el final del noveno mes, el feto mide alrededor de 20pulgadas (45cm) de largo y pesa entre 6 y 10 libras (2,700 y 25,500kg). Cambios en el cuerpo durante el tercer trimestre Su organismo continuar atravesando por muchos cambios durante el [place]. Estos cambios varan de [place] a [place]. Durante el tercer trimestre:  Seguir aumentando de [place]. Es de esperar que aumente entre 25 y 35libras (11 y 16kg) hacia el final del Psychiatrist.  Podrn aparecer las primeras Albertson's caderas, el abdomen y las [place].  Puede tener necesidad de Geographical information systems officer con ms frecuencia porque el feto baja hacia la pelvis y ejerce presin sobre la vejiga.  Puede desarrollar o continuar teniendo Merchant navy officer. Esto se debe a que el aumento de las hormonas hace que los msculos en el tubo digestivo trabajen ms lentamente.  Puede desarrollar o continuar teniendo estreimiento debido a que el aumento de las hormonas ralentiza la digestin y hace que los msculos que [employer] desechos a travs de los intestinos se relajen.  Puede desarrollar hemorroides. Estas son venas hinchadas (venas varicosas) en el recto que pueden causar picazn o dolor.  Puede desarrollar venas hinchadas y abultadas (venas varicosas) en las piernas.  Puede presentar ms dolor en la pelvis, la espalda o los muslos. Esto se debe al [employer]igroup de peso y al aumento de las hormonas que relajan las articulaciones.  Tal vez haya cambios en el cabello. Esto cambios pueden incluir su engrosamiento, crecimiento rpido y [employer] textura. Adems, a  algunas mujeres se les cae el cabello durante o despus del embarazo, o tienen el cabello seco o fino. Lo ms probable es que el cabello se le normalice despus del nacimiento del beb.  Sus pechos seguirn creciendo y se pondrn cada vez ms sensibles. Un lquido amarillo Charity fundraiser) puede salir de sus pechos. Esta es la primera leche que usted produce para su beb.  El ombligo puede salir hacia afuera.  Puede observar que se le [employer] Lilly and [employer], el rostro o los tobillos.  Puede presentar un aumento del hormigueo o entumecimiento en las manos, brazos y piernas. La piel de su vientre tambin puede sentirse entumecida.  Puede sentir que le falta el aire debido a que se expande el tero.  Puede tener ms problemas para dormir. Esto puede deberse al tamao de su vientre, una mayor necesidad de Geographical information systems officer y un aumento en el metabolismo de su cuerpo.  Puede notar que el feto "baja" o lo siente ms bajo, en el abdomen (aligeramiento).  Puede tener un aumento de la secrecin vaginal.  Puede notar que las articulaciones se sienten flojas y puede sentir dolor alrededor del hueso plvico. Qu debe esperar en las visitas prenatales Le harn exmenes prenatales cada 2semanas hasta la semana36. A partir de ese momento le harn los [employer]. Durante una visita prenatal de rutina:  La pesarn para asegurarse de que usted y el beb estn creciendo normalmente.  Le tomarn la presin arterial.  Le medirn el abdomen para controlar el desarrollo del beb.  Se escucharn los latidos cardacos fetales.  Se  evaluarn los resultados de los estudios solicitados en visitas anteriores.  Le revisarn el cuello del tero cuando est prxima la fecha de parto para controlar si el cuello uterino se ha afinado o adelgazado (borrado).  Le harn una prueba de estreptococos del grupo B. Esto sucede [employer]ation 35 y 33. El mdico puede preguntarle lo siguiente:  Cmo le gustara que fuera el [place].   Cmo se siente.  Si siente los movimientos del beb.  Si ha tenido sntomas anormales, como prdida de lquido, [place], dolores de cabeza intensos o clicos abdominales.  Si est consumiendo algn producto que contenga tabaco, como cigarrillos, tabaco de Theatre manager y Administrator, Civil Service.  Si tiene Colgate-Palmolive. Otros exmenes o estudios de deteccin que pueden realizarse durante el tercer trimestre incluyen lo siguiente:  Anlisis de sangre para controlar los niveles de hierro (anemia).  Controles fetales para determinar su salud, nivel de [place] y Designer, jewellery. Si tiene [place] enfermedad o hay problemas durante el embarazo, le harn estudios.  Prueba sin estrs. Esta prueba verifica la salud de su beb y se [place] para Engineer, manufacturing signos de problemas, tales como si el beb no est recibiendo suficiente oxgeno. Durante esta prueba, se coloca un cinturn alrededor de su vientre. Al moverse el beb, se controla su frecuencia cardaca. Qu es el falso [place]? El falso trabajo de parto es una afeccin en la que se sienten pequeos e irregulares espasmos de los msculos del tero (contracciones) que generalmente desaparecen al hacer reposo, cambiar de posicin o al beber agua. Estas contracciones se llaman contracciones de [employer] Corporation. Las [employer] pueden durar horas, [address] o incluso semanas, antes de que el verdadero trabajo de parto se inicie. Si las contracciones ocurren a intervalos regulares, se vuelven ms frecuentes, aumentan en intensidad o se vuelven dolorosas, debera ver al mdico.  Cules son los signos del [place] de [place]?  Clicos abdominales.  Contracciones regulares que comienzan en intervalos de 10 minutos y se vuelven ms fuertes y ms frecuentes con el tiempo.  Contracciones que comienzan en la parte superior del tero y se extienden hacia abajo, a la zona inferior del abdomen y la espalda.  Aumento de la presin en la pelvis y Armed forces logistics/support/administrative officer en la  espalda.  Una secrecin de mucosidad acuosa o con sangre que sale de la vagina.  Prdida de lquido amnitico. Esto tambin se conoce como "ruptura de la bolsa de las aguas". Esto puede ser un chorro o un goteo constante y lento de lquido. Informe a su mdico si tiene un color u [employer]. Si tiene alguno de [employer], llame a su mdico de inmediato, incluso si es antes de la fecha de Rolling[employer]. Siga estas indicaciones en su casa: Medicamentos  Siga las indicaciones del mdico en relacin con el uso de medicamentos. Durante el embarazo, hay medicamentos que pueden tomarse y otros que no.  Tome vitaminas prenatales que contengan por lo menos (?g) de cido flico.  Si est estreida, tome un laxante suave, si el mdico lo autoriza. Qu debe comer y beber   Meriel Flavors una dieta equilibrada que incluya gran cantidad de frutas y verduras frescas, cereales integrales, buenas fuentes de protenas como carnes [place], huevos o tofu, y lcteos descremados. El mdico la ayudar a Production assistant, radio cantidad de peso que puede [place].  No coma carne cruda ni quesos sin cocinar. Estos elementos contienen grmenes que pueden causar defectos congnitos en el beb.  Si no consume muchos alimentos con calcio, hable con  su mdico sobre si debera tomar un suplemento diario de calcio.  La ingesta diaria de cuatro o cinco comidas pequeas en lugar de tres comidas abundantes.  Limite el consumo de alimentos con alto contenido de grasas y azcares procesados, como alimentos fritos o dulces.  Para evitar el estreimiento: ? Bebe suficiente lquido para mantener la orina clara o de color amarillo plido. ? Consuma alimentos ricos en fibra, como frutas y verduras frescas, cereales integrales y frijoles. Actividad  Haga ejercicio solamente como se lo haya indicado el mdico. La mayora de las mujeres pueden continuar su rutina de ejercicios durante el [place]. Intente realizar como mnimo  de actividad fsica por lo menos 5das a la semana. Deje de hacer ejercicio si experimenta contracciones uterinas.  Evite levantar pesos [employer]ands.  No haga ejercicio en condiciones de calor o humedad extremas, o a grandes alturas.  Use zapatos cmodos de tacn bajo.  Adopte una buena postura.  Puede seguir teniendo [employer] [place]ers, excepto que el mdico le diga lo contrario. Alivio del dolor y del [place] pausas frecuentes y descanse con las piernas elevadas si tiene calambres en las piernas o dolor en la zona lumbar.  Dese baos de asiento con agua tibia para Engineer, materials o las molestias causadas por las hemorroides. Use una crema para las hemorroides si el mdico la autoriza.  Use un sostn que le brinde buen soporte para prevenir las molestias causadas por la sensibilidad en los pechos.  Si tiene venas varicosas: ? Use pantimedias que brinden soporte o medias de compresin como se lo haya indicado el mdico. ? Eleve los pies durante , 3 o 4veces por da. Cuidados prenatales  Escriba sus preguntas. Llvelas cuando concurra a las visitas prenatales.  Concurra a todas las visitas prenatales tal como se lo haya indicado el mdico. Esto es importante. Seguridad  Use el cinturn de seguridad en todo momento mientras conduce.  Haga una lista de los nmeros de telfono de Associate Professor, que [employer] nmeros de telfono de familiares, [place], el hospital y los departamentos de polica y bomberos. Instrucciones generales  Evite el contacto con las bandejas sanitarias de los gatos y la tierra que estos animales usan. Estos elementos contienen grmenes que pueden causar defectos congnitos en el beb. Si tiene Financial controller, pdale a alguien que limpie la caja de arena por usted.  No haga viajes largos excepto que sea absolutamente necesario y solo con la autorizacin de su mdico.  No se d baos de inmersin en agua caliente, baos turcos ni saunas.  No beber  alcohol.  No consuma ningn producto que contenga nicotina o tabaco, como cigarrillos y Administrator, Civil Service. Si necesita ayuda para dejar de fumar, consulte al mdico.  No use hierbas medicinales ni medicamentos que no le hayan recetado. Estas sustancias qumicas afectan la formacin y el desarrollo del beb.  No se haga duchas vaginales ni use tampones o toallas higinicas perfumadas.  No mantenga las piernas cruzadas durante largos periodos de [place].  Para prepararse para la llegada de su beb: ? Tome clases prenatales para entender, Education administrator, y hacer preguntas sobre el [place] de parto y [place]. ? Haga un ensayo de la partida al hospital. ? Visite el hospital y recorra el rea de maternidad. ? Pida un permiso de maternidad o paternidad a sus empleadores. ? Organice para que Research scientist (physical sciences) o amigo cuide a sus mascotas mientras usted est en el hospital. ? Compre un asiento de seguridad FirstEnergy [employer], y  asegrese de saber cmo instalarlo en su automvil. ? Prepare el bolso que llevar al hospital. ? Prepare la habitacin del beb. Asegrese de quitar todas las almohadas y [place] de peluche de la cuna del beb para evitar la asfixia.  Visite a su dentista si no lo ha [place]ar[place] manager. Use un cepillo de dientes blando para higienizarse los dientes y psese el hilo dental con suavidad. Comunquese con un mdico si:  No est segura de que est en trabajo de parto o de que ha roto la bolsa de las aguas.  Se siente mareada.  Siente clicos leves, presin en la pelvis o dolor persistente en el abdomen.  Siente dolor en la parte inferior de la espalda.  Tiene nuseas, vmitos o diarrea persistentes.  Margette Fast secrecin vaginal inusual o con mal olor.  Siente dolor al Continental Airlines. Solicite ayuda de inmediato si:  Rompe la bolsa de las aguas antes de la semana 37.  Tiene contracciones regulares en intervalos de menos de 5 minutos antes de la semana 82.   Tiene fiebre.  Tiene una prdida de lquido por la vagina.  Tiene sangrado o pequeas prdidas vaginales.  Tiene dolor o clicos abdominales intensos.  Baja de peso o sube de peso rpidamente.  Tiene dificultad para respirar y siente dolor de pecho.  Sbitamente se le hinchan mucho el rostro, las [place], los tobillos, los pies o las piernas.  Su beb se mueve menos de 10 veces en 2 horas.  Siente un dolor de cabeza intenso que no se alivia al tomar [employer].  Nota cambios en la visin. Resumen  El tercer trimestre comprende desde la [id number] [employer] [id number], es decir, desde el mes7 hasta el mes9. El tercer trimestre es un perodo en el que el beb en gestacin (feto) crece rpidamente.  Durante el tercer trimestre, su incomodidad puede aumentar a medida que usted y su beb continan aumentando de [place]. Es posible que tenga dolor abdominal, en las piernas y en la [place], [place] para dormir y [place] mayor necesidad de Garment/textile technologist.  Durante el tercer trimestre, sus pechos seguirn creciendo y se pondrn cada vez ms sensibles. Un lquido amarillo Public affairs consultant) puede salir de sus pechos. Esta es la primera leche que usted produce para su beb.  El falso trabajo de parto es una afeccin en la que se sienten pequeos e irregulares espasmos de los msculos del tero (contracciones) que a Programme researcher, broadcasting/film/video. Estas contracciones se llaman contracciones de SLM Corporation. Las Yahoo pueden durar horas, das o incluso semanas, antes de que el verdadero trabajo de parto se inicie.  Los signos del trabajo de parto pueden incluir: calambres abdominales; contracciones regulares que comienzan en intervalos de 10 minutos y se vuelven ms fuertes y ms frecuentes con el tiempo; una secrecin de mucosidad acuosa o con sangre que sale de la vagina; aumento de la presin en la pelvis y Social research officer, government latente en la espalda; y prdida de lquido amnitico. Esta informacin no tiene Marine scientist el  consejo del mdico. Asegrese de hacerle al mdico cualquier pregunta que tenga. Document Revised: 05/23/2016 Document Reviewed: 05/23/2016 Elsevier Patient Education  [place].

## 2019-06-02 NOTE — Progress Notes (Signed)
   PRENATAL VISIT NOTE  Subjective:  Sharon Hess is a 24 y.o. G3P2002 at [redacted]w[redacted]d being seen today for ongoing prenatal care. Patient is Spanish-speaking only, interpreter present for this encounter. 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  Patient reports no complaints.  Contractions: Not present. Vag. Bleeding: None.  Movement: Present. Denies leaking of fluid.   The following portions of the patient's history were reviewed and updated as appropriate: allergies, current medications, past family history, past medical history, past social history, past surgical history and problem list.   Objective:   Vitals:   06/02/19 1045  BP: 124/74  Pulse: (!) 113  Weight: 233 lb 4.8 oz (105.8 kg)    Fetal Status: Fetal Heart Rate (bpm): 149 Fundal Height: 33 cm Movement: Present     General:  Alert, oriented and cooperative. Patient is in no acute distress.  Skin: Skin is warm and dry. No rash noted.   Cardiovascular: Normal heart rate noted  Respiratory: Normal respiratory effort, no problems with respiration noted  Abdomen: Soft, gravid, appropriate for gestational age.  Pain/Pressure: Absent     Pelvic: Cervical exam deferred        Extremities: Normal range of motion.  Edema: None  Mental Status: Normal mood and affect. Normal behavior. Normal judgment and thought content.   Assessment and Plan:  Pregnancy: G3P2002 at [redacted]w[redacted]d 1. History of cesarean section Counseled regarding TOLAC vs RCS; risks/benefits discussed in detail. All questions answered.  Patient elects for TOLAC, consent signed 06/02/2019.  2. Supervision of high risk pregnancy,  antepartum Normal third trimester labs last visit. Preterm labor symptoms and general obstetric precautions including but not limited to vaginal bleeding, contractions, leaking of fluid and fetal movement were reviewed in detail with the patient. Please refer to After Visit Summary for other counseling recommendations.   Return in about 2 weeks (around 06/16/2019) for OFFICE OB Visit.  No future appointments.  Jaynie Collins, MD

## 2019-06-18 ENCOUNTER — Ambulatory Visit (INDEPENDENT_AMBULATORY_CARE_PROVIDER_SITE_OTHER): Payer: Self-pay | Admitting: Obstetrics & Gynecology

## 2019-06-18 ENCOUNTER — Encounter: Payer: Self-pay | Admitting: Obstetrics & Gynecology

## 2019-06-18 ENCOUNTER — Other Ambulatory Visit: Payer: Self-pay

## 2019-06-18 VITALS — BP 128/75 | HR 118 | Wt 237.7 lb

## 2019-06-18 DIAGNOSIS — O34219 Maternal care for unspecified type scar from previous cesarean delivery: Secondary | ICD-10-CM

## 2019-06-18 DIAGNOSIS — R200 Anesthesia of skin: Secondary | ICD-10-CM

## 2019-06-18 DIAGNOSIS — Z3A34 34 weeks gestation of pregnancy: Secondary | ICD-10-CM

## 2019-06-18 NOTE — Progress Notes (Signed)
Numbness in hands

## 2019-06-18 NOTE — Progress Notes (Signed)
Subjective:    Sharon Hess is a 24 y.o. [id number] [redacted]w[redacted]d being seen today for her obstetrical visit.  Patient reports no complaints. Fetal movement: normal.  Objective:    BP 128/75   Pulse (!) 118   Wt 107.8 kg   LMP  (LMP Unknown)   BMI 44.91 kg/m   Physical Exam  Vitals reviewed. Constitutional: She is oriented to person, place, and time. She appears well-developed and well-nourished.  HENT:  Head: Normocephalic and atraumatic.  Eyes: Pupils are equal, round, and reactive to light.  Cardiovascular: Normal rate.  Respiratory: Effort normal.  GI: Soft. She exhibits no distension. There is no abdominal tenderness. There is no guarding.  Musculoskeletal:        General: No edema.     Cervical back: Normal range of motion.  Neurological: She is alert and oriented to person, place, and time.  Skin: Skin is warm and dry.  Psychiatric: She has a normal mood and affect. Her behavior is normal.      FHT: Fetal Heart Rate (bpm): 157  Uterine Size:  34cm     Assessment:    Pregnancy:  G3P2002 at 34.0wks here for prenatal visit.     Plan:    Patient Active Problem List   Diagnosis Date Noted  . Language barrier 04/28/2019  . Hepatitis B affecting pregnancy in second trimester, antepartum 02/12/2019  . Supervision of high risk pregnancy, antepartum   . Obesity affecting pregnancy   . History of cesarean section   . Right elbow pain 11/29/2017  . Low back pain 12/01/2014  . Fatty liver disease, nonalcoholic 07/13/2014  Pt doing well, no complaints, GBS cultures next visit.

## 2019-06-30 ENCOUNTER — Ambulatory Visit (INDEPENDENT_AMBULATORY_CARE_PROVIDER_SITE_OTHER): Payer: Self-pay | Admitting: Family Medicine

## 2019-06-30 ENCOUNTER — Other Ambulatory Visit: Payer: Self-pay

## 2019-06-30 ENCOUNTER — Other Ambulatory Visit (HOSPITAL_COMMUNITY)
Admission: RE | Admit: 2019-06-30 | Discharge: 2019-06-30 | Disposition: A | Discharge status: Home / Self Care | Payer: Self-pay | Source: Ambulatory Visit | Attending: Family Medicine | Admitting: Family Medicine

## 2019-06-30 VITALS — BP 123/82 | HR 111 | Wt 237.0 lb

## 2019-06-30 DIAGNOSIS — O0990 Supervision of high risk pregnancy, unspecified, unspecified trimester: Secondary | ICD-10-CM | POA: Insufficient documentation

## 2019-06-30 DIAGNOSIS — E669 Obesity, unspecified: Secondary | ICD-10-CM

## 2019-06-30 DIAGNOSIS — O34219 Maternal care for unspecified type scar from previous cesarean delivery: Secondary | ICD-10-CM

## 2019-06-30 DIAGNOSIS — N898 Other specified noninflammatory disorders of vagina: Secondary | ICD-10-CM | POA: Insufficient documentation

## 2019-06-30 DIAGNOSIS — Z789 Other specified health status: Secondary | ICD-10-CM

## 2019-06-30 DIAGNOSIS — Z3A35 35 weeks gestation of pregnancy: Secondary | ICD-10-CM

## 2019-06-30 DIAGNOSIS — Z98891 History of uterine scar from previous surgery: Secondary | ICD-10-CM

## 2019-06-30 DIAGNOSIS — O99891 Other specified diseases and conditions complicating pregnancy: Secondary | ICD-10-CM

## 2019-06-30 DIAGNOSIS — O99212 Obesity complicating pregnancy, second trimester: Secondary | ICD-10-CM

## 2019-06-30 NOTE — Progress Notes (Signed)
Patient reports yellow odorous discharge that started yesterday

## 2019-06-30 NOTE — Addendum Note (Signed)
Addended by: Kathee Delton on: 06/30/2019 03:05 PM   Modules accepted: Orders

## 2019-06-30 NOTE — Progress Notes (Signed)
   PRENATAL VISIT NOTE  Subjective:  Sharon Hess is a 24 y.o. G3P2002 at [redacted]w[redacted]d being seen today for ongoing prenatal care.  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  Patient reports vaginal irritation.  Contractions: Irritability. Vag. Bleeding: None.  Movement: Present. Denies leaking of fluid.   The following portions of the patient's history were reviewed and updated as appropriate: allergies, current medications, past family history, past medical history, past social history, past surgical history and problem list.   Objective:   Vitals:   06/30/19 1423  BP: 123/82  Pulse: (!) 111  Weight: 237 lb (107.5 kg)    Fetal Status:     Movement: Present     General:  Alert, oriented and cooperative. Patient is in no acute distress.  Skin: Skin is warm and dry. No rash noted.   Cardiovascular: Normal heart rate noted  Respiratory: Normal respiratory effort, no problems with respiration noted  Abdomen: Soft, gravid, appropriate for gestational age.  Pain/Pressure: Present     Pelvic: Cervical exam performed in the presence of a chaperone        Extremities: Normal range of motion.  Edema: None  Mental Status: Normal mood and affect. Normal behavior. Normal judgment and thought content.   Assessment and Plan:  Pregnancy: G3P2002 at [redacted]w[redacted]d 1. Supervision of high risk pregnancy, antepartum FHT and FH normal. GBS done today  2. Vaginal discharge - Cervicovaginal ancillary only( [facility])  3. Obesity affecting pregnancy in second trimester  4. History of cesarean section Desires TOLAC - form signed  5. Language barrier Interpreter  used  Preterm labor symptoms and general obstetric precautions including but not limited to vaginal bleeding, contractions, leaking of fluid and fetal movement were reviewed in detail with the patient. Please refer to After Visit Summary for other counseling recommendations.   No follow-ups on file.  No future appointments.  Levie Heritage, DO

## 2019-07-01 LAB — CERVICOVAGINAL ANCILLARY ONLY
Bacterial Vaginitis (gardnerella): NEGATIVE
Candida Glabrata: NEGATIVE
Candida Vaginitis: NEGATIVE
Chlamydia: NEGATIVE
Comment: NEGATIVE
Comment: NEGATIVE
Comment: NEGATIVE
Comment: NEGATIVE
Comment: NEGATIVE
Comment: NORMAL
Neisseria Gonorrhea: NEGATIVE
Trichomonas: NEGATIVE

## 2019-07-03 LAB — CULTURE, BETA STREP (GROUP B ONLY): Strep Gp B Culture: NEGATIVE

## 2019-07-07 ENCOUNTER — Other Ambulatory Visit: Payer: Self-pay

## 2019-07-07 ENCOUNTER — Encounter: Payer: Self-pay | Admitting: Certified Nurse Midwife

## 2019-07-07 ENCOUNTER — Ambulatory Visit (INDEPENDENT_AMBULATORY_CARE_PROVIDER_SITE_OTHER): Payer: Self-pay | Admitting: Certified Nurse Midwife

## 2019-07-07 VITALS — BP 134/86 | HR 118 | Wt 240.6 lb

## 2019-07-07 DIAGNOSIS — Z789 Other specified health status: Secondary | ICD-10-CM

## 2019-07-07 DIAGNOSIS — Z3A36 36 weeks gestation of pregnancy: Secondary | ICD-10-CM

## 2019-07-07 DIAGNOSIS — Z98891 History of uterine scar from previous surgery: Secondary | ICD-10-CM

## 2019-07-07 DIAGNOSIS — O0993 Supervision of high risk pregnancy, unspecified, third trimester: Secondary | ICD-10-CM

## 2019-07-07 DIAGNOSIS — O99213 Obesity complicating pregnancy, third trimester: Secondary | ICD-10-CM

## 2019-07-07 DIAGNOSIS — O99210 Obesity complicating pregnancy, unspecified trimester: Secondary | ICD-10-CM

## 2019-07-07 DIAGNOSIS — O34219 Maternal care for unspecified type scar from previous cesarean delivery: Secondary | ICD-10-CM

## 2019-07-07 DIAGNOSIS — O0990 Supervision of high risk pregnancy, unspecified, unspecified trimester: Secondary | ICD-10-CM

## 2019-07-07 NOTE — Progress Notes (Signed)
   PRENATAL VISIT NOTE  Subjective:  Sharon Hess is a 24 y.o. G3P2002 at [redacted]w[redacted]d being seen today for ongoing prenatal care.  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  Patient reports vaginal pressure.  Contractions: Irritability. Vag. Bleeding: None.  Movement: Present. Denies leaking of fluid.   The following portions of the patient's history were reviewed and updated as appropriate: allergies, current medications, past family history, past medical history, past social history, past surgical history and problem list.   Objective:   Vitals:   07/07/19 1407  BP: 134/86  Pulse: (!) 118  Weight: 240 lb 9.6 oz (109.1 kg)    Fetal Status: Fetal Heart Rate (bpm): 150 Fundal Height: 39 cm Movement: Present  Presentation: Vertex  General:  Alert, oriented and cooperative. Patient is in no acute distress.  Skin: Skin is warm and dry. No rash noted.   Cardiovascular: Normal heart rate noted  Respiratory: Normal respiratory effort, no problems with respiration noted  Abdomen: Soft, gravid, appropriate for gestational age.  Pain/Pressure: Present     Pelvic: Cervical exam performed in the presence of a chaperone Dilation: 1 Effacement (%): Thick Station: Ballotable  Extremities: Normal range of motion.  Edema: Moderate pitting, indentation subsides rapidly  Mental Status: Normal mood and affect. Normal behavior. Normal judgment and thought content.   Assessment and Plan:  Pregnancy: G3P2002 at [redacted]w[redacted]d 1. Supervision of high risk pregnancy, antepartum - Patient doing well, reports vaginal pressure with walking - routine prenatal care - anticipatory guidance on  upcoming appointments - labor precautions and reasons to present to MAU discussed   2. Obesity affecting pregnancy, antepartum - TWG 40lb during pregnancy   3. Language barrier - Spanish interpreter at bedside throughout visit   4. History of cesarean section - Patient plans for VBAC, consent signed on 5/10 - needs to be scanned into chart   Preterm labor symptoms and general obstetric precautions including but not limited to vaginal bleeding, contractions, leaking of fluid and fetal movement were reviewed in detail with the patient. Please refer to After Visit Summary for other counseling recommendations.   Return in about 1 week (around 07/14/2019) for HROB.  Future Appointments  Date Time Provider Department Center  07/14/2019  3:15 PM Malachy Chamber, MD [facility] [place]  07/21/2019  1:15 PM Warden Fillers, MD [facility]    Sharyon Cable, CNM

## 2019-07-07 NOTE — Patient Instructions (Signed)
Razones para volver a MAU:  1. Las contracciones son cada 5 minutos o menos, cada uno de los ltimos 1 minuto, stos han llevado a cabo durante 1-2 horas, y no se puede caminar o hablar durante ellas 2. Usted tiene un gran chorro de lquido o un goteo de lquido que no se detiene y hay que usar una toalla 3. Ha de sangrado que es de color rojo brillante, denso que el manchado - como sangrado menstrual (manchado puede ser normal en trabajo de parto prematuro o despus de una comprobacin de su cuello uterino) 4. Usted no se siente el beb se mueve como l / ella hace normalmente 

## 2019-07-11 ENCOUNTER — Inpatient Hospital Stay (HOSPITAL_COMMUNITY)
Admission: AD | Admit: 2019-07-11 | Discharge: 2019-07-11 | Disposition: A | Discharge status: Home / Self Care | Payer: Self-pay | Attending: Obstetrics and Gynecology | Admitting: Obstetrics and Gynecology

## 2019-07-11 ENCOUNTER — Other Ambulatory Visit: Payer: Self-pay

## 2019-07-11 DIAGNOSIS — O99210 Obesity complicating pregnancy, unspecified trimester: Secondary | ICD-10-CM

## 2019-07-11 DIAGNOSIS — O0990 Supervision of high risk pregnancy, unspecified, unspecified trimester: Secondary | ICD-10-CM

## 2019-07-11 DIAGNOSIS — O471 False labor at or after 37 completed weeks of gestation: Secondary | ICD-10-CM | POA: Insufficient documentation

## 2019-07-11 DIAGNOSIS — Z3A37 37 weeks gestation of pregnancy: Secondary | ICD-10-CM | POA: Insufficient documentation

## 2019-07-11 DIAGNOSIS — O479 False labor, unspecified: Secondary | ICD-10-CM

## 2019-07-11 DIAGNOSIS — Z98891 History of uterine scar from previous surgery: Secondary | ICD-10-CM

## 2019-07-11 NOTE — MAU Provider Note (Signed)
Ms. Roneka Gilpin is a [id number] at [redacted]w[redacted]d seen in MAU for labor. RN labor check, not seen by provider.  SVE by RN Dilation: Fingertip Effacement (%): Thick Cervical Position: Posterior Station: Ballotable Presentation: Undeterminable Exam by:: Ginger Morris, RN   NST - FHR: 150 bpm / moderate variability / accels present / decels absent NST reactive / TOCO: irregular  Plan:  D/C home with labor precautions Keep scheduled appt on 07/14/19 with Dr Raynelle Dick at [facility]  [place], [place]  07/11/2019 6:58 PM

## 2019-07-11 NOTE — Discharge Instructions (Signed)
Razones para volver a MAU/[facility]:  1. Las contracciones son cada 5 minutos o menos, cada uno de los ltimos 1 minuto, stos han llevado a cabo durante 1-2 horas, y no se puede caminar o hablar durante ellas 2. Usted tiene un gran chorro de lquido o un goteo de lquido que no se detiene y hay que usar una toalla 3. Ha de sangrado que es de color rojo brillante, denso que el manchado - como sangrado menstrual (manchado puede ser normal en trabajo de parto prematuro o despus de una comprobacin de su cuello uterino) 4. Usted no se siente el beb se mueve como l / ella hace normalmente  

## 2019-07-11 NOTE — MAU Note (Signed)
.   [facility] Sharon Hess is a 24 y.o. at [redacted]w[redacted]d here in MAU reporting: ctx every 1 minute since 1100 this morning. No VB or LOF. Endorses good fetal movement.   Pain score: 6 Vitals:   07/11/19 1740  BP: (!) 148/83  Pulse: (!) 109  Resp: 16  Temp: 99.1 F (37.3 C)  SpO2: 100%     FHT:170 Lab orders placed from triage:

## 2019-07-14 ENCOUNTER — Ambulatory Visit (INDEPENDENT_AMBULATORY_CARE_PROVIDER_SITE_OTHER): Payer: Self-pay | Admitting: Obstetrics & Gynecology

## 2019-07-14 ENCOUNTER — Other Ambulatory Visit: Payer: Self-pay

## 2019-07-14 VITALS — BP 129/85 | HR 99 | Wt 244.5 lb

## 2019-07-14 DIAGNOSIS — Z98891 History of uterine scar from previous surgery: Secondary | ICD-10-CM

## 2019-07-14 DIAGNOSIS — Z3A37 37 weeks gestation of pregnancy: Secondary | ICD-10-CM

## 2019-07-14 DIAGNOSIS — O0990 Supervision of high risk pregnancy, unspecified, unspecified trimester: Secondary | ICD-10-CM

## 2019-07-14 DIAGNOSIS — O0993 Supervision of high risk pregnancy, unspecified, third trimester: Secondary | ICD-10-CM

## 2019-07-14 NOTE — Progress Notes (Signed)
   PRENATAL VISIT NOTE  Subjective:  Sharon Hess is a 24 y.o. G3P2002 at [redacted]w[redacted]d being seen today for ongoing prenatal care.  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  Patient reports no complaints.  Contractions: Irritability. Vag. Bleeding: None.  Movement: Present. Denies leaking of fluid.   The following portions of the patient's history were reviewed and updated as appropriate: allergies, current medications, past family history, past medical history, past social history, past surgical history and problem list.   Objective:   Vitals:   07/14/19 1535  BP: 129/85  Pulse: 99  Weight: 244 lb 8 oz (110.9 kg)    Fetal Status: Fetal Heart Rate (bpm): 151   Movement: Present     General:  Alert, oriented and cooperative. Patient is in no acute distress.  Skin: Skin is warm and dry. No rash noted.   Cardiovascular: Normal heart rate noted  Respiratory: Normal respiratory effort, no problems with respiration noted  Abdomen: Soft, gravid, appropriate for gestational age.  Pain/Pressure: Present     Pelvic: Cervical exam deferred        Extremities: Normal range of motion.  Edema: Trace  Mental Status: Normal mood and affect. Normal behavior. Normal judgment and thought content.   Assessment and Plan:  Pregnancy: G3P2002 at [redacted]w[redacted]d There are no diagnoses linked to this encounter. Term labor symptoms and general obstetric precautions including but not limited to vaginal bleeding, contractions, leaking of fluid and fetal movement were reviewed in detail with the patient. Please refer to After Visit Summary for other counseling recommendations.   No  follow-ups on file.  Future Appointments  Date Time Provider Department Center  07/21/2019  1:15 PM Warden Fillers, MD [facility]    Malachy Chamber, MD

## 2019-07-14 NOTE — Patient Instructions (Signed)
Preparing for Vaginal Birth After Cesarean Delivery Vaginal birth after cesarean delivery (VBAC) is giving birth vaginally after previously delivering a baby through a cesarean section (C-section). You and your health are provider will discuss your options and whether you may be a good candidate for VBAC. What are my options? After a cesarean delivery, your options for future deliveries may include:  Scheduled repeat cesarean delivery. This is done in a hospital with an operating room.  Trial of labor after cesarean (TOLAC). A successful TOLAC results in a vaginal delivery. If it is not successful, you will need to have a cesarean delivery. TOLAC should be attempted in facilities where an emergency cesarean delivery can be performed. It should not be done as a home birth. Talk with your health care provider about the risks and benefits of each option early in your pregnancy. The best option for you will depend on your preferences and your overall health as well as your baby's. What should I know about my past cesarean delivery? It is important to know what type of incision was made in your uterus in a past cesarean delivery. The type of incision can affect the success of your TOLAC. Types of incisions include:  Low transverse. This is a side-to-side cut low on your uterus. The scar on your skin looks like a horizontal line just above your pubic area. This type of cut is the most common and makes you a good candidate for TOLAC.  Low vertical. This is an up-and-down cut low on your uterus. The scar on your skin looks like a vertical line between your pubic area and belly button. This type of cut puts you at higher risk for problems during TOLAC.  High vertical or classical. This is an up-and-down cut high on your uterus. The scar on your skin looks like a vertical line that runs over the top of your belly button. This type of cut has the highest risk for problems and usually means that TOLAC is not an  option. When is VBAC not an option? As you progress through your pregnancy, circumstances may change and you may need to reconsider your options. Your situation may also change even as you begin TOLAC. Your health care provider may not want you to attempt a VBAC if you:  Need to have labor started (induced) because your cervix is not ready for labor.  Have never had a vaginal delivery.  Have had more than two cesarean deliveries.  Are overdue.  Are pregnant with a very large baby.  Have a condition that causes high blood pressure (preeclampsia). Questions to ask your health care provider  Am I a good candidate for TOLAC?  What are my chances of a successful vaginal delivery?  Is my preferred birth location equipped for a TOLAC?  What are my pain management options during a TOLAC? Where to find more information  American Congress of Obstetricians and Gynecologists: www.acog.org  American College of Nurse-Midwives: www.midwife.org Summary  Vaginal birth after cesarean delivery (VBAC) is giving birth vaginally after previously delivering a baby through a cesarean section (C-section).  VBAC may be a safe and appropriate option for you depending on your medical history and other risk factors. Talk with your health care provider about the options available to you, and the risks and benefits of each early in your pregnancy.  TOLAC should be attempted in facilities where emergency cesarean section procedures can be performed. This information is not intended to replace advice given to you by   your health care provider. Make sure you discuss any questions you have with your health care provider. Document Revised: 05/07/2018 Document Reviewed: 04/20/2016 Elsevier Patient Education  2020 Elsevier Inc.  

## 2019-07-21 ENCOUNTER — Ambulatory Visit (INDEPENDENT_AMBULATORY_CARE_PROVIDER_SITE_OTHER): Payer: Self-pay | Admitting: Obstetrics and Gynecology

## 2019-07-21 ENCOUNTER — Other Ambulatory Visit: Payer: Self-pay

## 2019-07-21 VITALS — BP 137/88 | HR 101 | Wt 247.0 lb

## 2019-07-21 DIAGNOSIS — Z98891 History of uterine scar from previous surgery: Secondary | ICD-10-CM

## 2019-07-21 DIAGNOSIS — O0990 Supervision of high risk pregnancy, unspecified, unspecified trimester: Secondary | ICD-10-CM

## 2019-07-21 DIAGNOSIS — Z3A38 38 weeks gestation of pregnancy: Secondary | ICD-10-CM

## 2019-07-21 DIAGNOSIS — Z789 Other specified health status: Secondary | ICD-10-CM

## 2019-07-21 DIAGNOSIS — O34219 Maternal care for unspecified type scar from previous cesarean delivery: Secondary | ICD-10-CM

## 2019-07-21 DIAGNOSIS — O0993 Supervision of high risk pregnancy, unspecified, third trimester: Secondary | ICD-10-CM

## 2019-07-21 DIAGNOSIS — O98413 Viral hepatitis complicating pregnancy, third trimester: Secondary | ICD-10-CM

## 2019-07-21 DIAGNOSIS — O99213 Obesity complicating pregnancy, third trimester: Secondary | ICD-10-CM

## 2019-07-21 DIAGNOSIS — B1910 Unspecified viral hepatitis B without hepatic coma: Secondary | ICD-10-CM

## 2019-07-21 DIAGNOSIS — K760 Fatty (change of) liver, not elsewhere classified: Secondary | ICD-10-CM

## 2019-07-21 NOTE — Progress Notes (Signed)
   PRENATAL VISIT NOTE  Subjective:  Sharon Hess is a 24 y.o. G3P2002 at [redacted]w[redacted]d being seen today for ongoing prenatal care.  She is currently monitored for the following issues for this high-risk pregnancy and has Fatty liver disease, nonalcoholic; Low back pain; Right elbow pain; Supervision of high risk pregnancy, antepartum; Obesity affecting pregnancy; History of cesarean section; Hepatitis B affecting pregnancy in third trimester; and Language barrier on their problem list.  Patient doing well with no acute concerns today. She reports no complaints.  Contractions: Irritability. Vag. Bleeding: None.  Movement: Present. Denies leaking of fluid.   The following portions of the patient's history were reviewed and updated as appropriate: allergies, current medications, past family history, past medical history, past social history, past surgical history and problem list. Problem list updated.  Objective:   Vitals:   07/21/19 1340  BP: 137/88  Pulse: (!) 101  Weight: 247 lb (112 kg)    Fetal Status: Fetal Heart Rate (bpm): 152   Movement: Present     General:  Alert, oriented and cooperative. Patient is in no acute distress.  Skin: Skin is warm and dry. No rash noted.   Cardiovascular: Normal heart rate noted  Respiratory: Normal respiratory effort, no problems with respiration noted  Abdomen: Soft, gravid, appropriate for gestational age.  Pain/Pressure: Present     Pelvic: Cervical exam deferred        Extremities: Normal range of motion.  Edema: Trace  Mental Status:  Normal mood and affect. Normal behavior. Normal judgment and thought content.   Assessment and Plan:  Pregnancy: G3P2002 at [redacted]w[redacted]d  1. Fatty liver disease, nonalcoholic   2. Hepatitis B affecting pregnancy in third trimester, antepartum Caution using FSE in labor  3. Supervision of high risk pregnancy, antepartum Check cvx next visit, discuss/set IOL if undelivered at next visit  4. Obesity  affecting pregnancy in third trimester   5. History of cesarean section Pt still desires TOLAC  6. Language barrier Interpreter present  Term labor symptoms and general obstetric precautions including but not limited to vaginal bleeding, contractions, leaking of fluid and fetal movement were reviewed in detail with the patient.  Please refer to After Visit Summary for other counseling recommendations.   Return for [facility] [place], in person.   Mariel Aloe, MD

## 2019-07-21 NOTE — Patient Instructions (Signed)
Parto vaginal despus de un parto por cesrea Vaginal Birth After Cesarean Delivery  Un parto vaginal despus de un parto por cesrea (PVDC) es dar a luz por la vagina despus de haber dado a luz por medio de una intervencin quirrgica llamada cesrea. Es posible que el PVDC sea una alternativa segura para usted, segn su salud y otros factores. Es importante que hable con su mdico acerca del PVDC desde comienzos del embarazo de modo que pueda comprender los riesgos, beneficios y opciones. Hablar sobre estos temas desde el comienzo le permitir tener tiempo para planificar el parto. Quines son las mejores candidatas para tener un PVDC? Las mejores candidatas para tener un PVDC son las mujeres que cumplen con los siguientes requisitos:  Tuvieron uno o dos partos por cesrea anteriores, y las incisiones realizadas durante los partos fueron horizontales (transversales bajas).  No tienen una cicatriz uterina vertical (clsica).  No han tenido una ruptura en la pared del tero (ruptura uterina).  Piensan tener otros embarazos. Es ms probable que un PVDC se realice correctamente en los siguientes casos:  Mujeres que ya tuvieron partos vaginales antes.  Cuando el trabajo de parto comienza sin ninguna intervencin (de forma espontnea) antes de la fecha prevista. Cules son los beneficios del PVDC? Algunos de los beneficios de que el beb nazca por parto vaginal en lugar de por cesrea:  Estada ms corta en el hospital.  Menor tiempo de recuperacin.  Menos dolor.  Evitar los riesgos asociados a las cirugas mayores, como las infecciones y los cogulos de sangre.  Menor prdida de sangre y menor probabilidad de necesitar sangre de un donante (transfusin). Cules son los riesgos del PVDC? El principal riesgo de intentar tener un PVDC es que existe una posibilidad de que falle, lo que obligar al mdico a realizar una cesrea. Los otros riesgos son muy poco frecuentes y pueden ser  los siguientes:  Desgarro (ruptura) de la cicatriz de un parto por cesrea anterior.  Otros riesgos asociados con los partos vaginales. Si se debe repetir un parto por cesrea, los riesgos incluyen los siguientes:  Prdida de sangre.  Infeccin.  Cogulos de sangre.  Lesin en los rganos circundantes.  Extirpacin del tero (histerectoma), si se daa.  Problemas de placenta en embarazos futuros. Qu ms debo saber sobre las alternativas? El parto vaginal despus de una cesrea es similar a un parto espontneo vaginal normal. Por lo tanto, es seguro:  Intentarlo cuando se trata de gemelos.  Que el mdico intente girar al beb si se encuentra de nalgas (versin ceflica externa) durante el trabajo de parto.  Colocar anestesia epidural para aliviar el dolor. Considerar posibilidades sobre dnde le gustara que ocurriera el parto. El PVDC debe llevarse a cabo en centros donde se pueda realizar un parto por cesrea de urgencia. No se recomiendan los partos domiciliarios si planea tener un PVDC. Cualquier cambio en su salud o la de su beb durante el embarazo puede ser motivo de un cambio de decisin respecto del parto vaginal. El mdico podra recomendarle no realizar un PVDC en los siguientes casos:  El beb parece pesar 8.8lb (4kg) o ms.  Tiene preeclampsia. Esta es una afeccin que provoca el aumento de la presin arterial y otros sntomas, como hinchazn y dolores de cabeza.  Tendr un PVDC menos de 19meses despus de un parto por cesrea.  Ya pas la fecha prevista del parto.  Necesita que se inicie el trabajo de parto (induccin) porque el cuello uterino no est listo para   el parto (desfavorable). Dnde encontrar ms informacin  Asociacin Americana del Embarazo (American Pregnancy Association): americanpregnancy.org.  Congreso Estadounidense de Obstetras y Gineclogos (American College of Obstetricians and Gynecologists): acog.org Resumen  Un parto vaginal  despus de un parto por cesrea (PVDC) es dar a luz por la vagina despus de haber dado a luz por medio de una intervencin quirrgica llamada cesrea. Es posible que el PVDC sea una alternativa segura para usted, segn su salud y otros factores.  Hable con el mdico desde comienzos del embarazo para que pueda comprender los riesgos, los beneficios y las alternativas, y tener mucho tiempo para planificar el parto.  El principal riesgo de intentar tener un PVDC es que existe una posibilidad de que falle, lo que obligar al mdico a realizar una cesrea. Los otros riesgos son muy poco frecuentes. Esta informacin no tiene como fin reemplazar el consejo del mdico. Asegrese de hacerle al mdico cualquier pregunta que tenga. Document Revised: 08/15/2016 Document Reviewed: 08/15/2016 Elsevier Patient Education  2020 Elsevier Inc.  

## 2019-07-22 ENCOUNTER — Encounter (HOSPITAL_COMMUNITY): Payer: Self-pay | Admitting: Obstetrics and Gynecology

## 2019-07-22 ENCOUNTER — Inpatient Hospital Stay (HOSPITAL_COMMUNITY)
Admission: AD | Admit: 2019-07-22 | Discharge: 2019-07-22 | Disposition: A | Discharge status: Home / Self Care | Payer: Self-pay | Attending: Obstetrics and Gynecology | Admitting: Obstetrics and Gynecology

## 2019-07-22 DIAGNOSIS — N898 Other specified noninflammatory disorders of vagina: Secondary | ICD-10-CM

## 2019-07-22 DIAGNOSIS — Z0371 Encounter for suspected problem with amniotic cavity and membrane ruled out: Secondary | ICD-10-CM | POA: Insufficient documentation

## 2019-07-22 DIAGNOSIS — Z3A38 38 weeks gestation of pregnancy: Secondary | ICD-10-CM | POA: Insufficient documentation

## 2019-07-22 LAB — POCT FERN TEST: POCT Fern Test: NEGATIVE

## 2019-07-22 NOTE — MAU Note (Signed)
Pt reports to MAU c/o LOF that is clear and it started at 1800. Pt reports abdominal ctx every 10 min. Pt is also having a back pain.

## 2019-07-22 NOTE — Discharge Instructions (Signed)
Parto vaginal despus de un parto por cesrea Vaginal Birth After Cesarean Delivery  Un parto vaginal despus de un parto por cesrea (PVDC) es dar a luz por la vagina despus de haber dado a luz por medio de una intervencin quirrgica llamada cesrea. Es posible que el PVDC sea una alternativa segura para usted, segn su salud y otros factores. Es importante que hable con su mdico acerca del PVDC desde comienzos del embarazo de modo que pueda comprender los riesgos, beneficios y opciones. Hablar sobre estos temas desde el comienzo le permitir tener tiempo para planificar el parto. Quines son las mejores candidatas para tener un PVDC? Las mejores candidatas para tener un PVDC son las mujeres que cumplen con los siguientes requisitos:  Tuvieron uno o dos partos por cesrea anteriores, y las incisiones realizadas durante los partos fueron horizontales (transversales bajas).  No tienen una cicatriz uterina vertical (clsica).  No han tenido una ruptura en la pared del tero (ruptura uterina).  Piensan tener otros embarazos. Es ms probable que un PVDC se realice correctamente en los siguientes casos:  Mujeres que ya tuvieron partos vaginales antes.  Cuando el trabajo de parto comienza sin ninguna intervencin (de forma espontnea) antes de la fecha prevista. Cules son los beneficios del PVDC? Algunos de los beneficios de que el beb nazca por parto vaginal en lugar de por cesrea:  Estada ms corta en el hospital.  Menor tiempo de recuperacin.  Menos dolor.  Evitar los riesgos asociados a las cirugas mayores, como las infecciones y los cogulos de sangre.  Menor prdida de sangre y menor probabilidad de necesitar sangre de un donante (transfusin). Cules son los riesgos del PVDC? El principal riesgo de intentar tener un PVDC es que existe una posibilidad de que falle, lo que obligar al mdico a realizar una cesrea. Los otros riesgos son muy poco frecuentes y pueden ser  los siguientes:  Desgarro (ruptura) de la cicatriz de un parto por cesrea anterior.  Otros riesgos asociados con los partos vaginales. Si se debe repetir un parto por cesrea, los riesgos incluyen los siguientes:  Prdida de sangre.  Infeccin.  Cogulos de sangre.  Lesin en los rganos circundantes.  Extirpacin del tero (histerectoma), si se daa.  Problemas de placenta en embarazos futuros. Qu ms debo saber sobre las alternativas? El parto vaginal despus de una cesrea es similar a un parto espontneo vaginal normal. Por lo tanto, es seguro:  Intentarlo cuando se trata de gemelos.  Que el mdico intente girar al beb si se encuentra de nalgas (versin ceflica externa) durante el trabajo de parto.  Colocar anestesia epidural para aliviar el dolor. Considerar posibilidades sobre dnde le gustara que ocurriera el parto. El PVDC debe llevarse a cabo en centros donde se pueda realizar un parto por cesrea de urgencia. No se recomiendan los partos domiciliarios si planea tener un PVDC. Cualquier cambio en su salud o la de su beb durante el embarazo puede ser motivo de un cambio de decisin respecto del parto vaginal. El mdico podra recomendarle no realizar un PVDC en los siguientes casos:  El beb parece pesar 8.8lb (4kg) o ms.  Tiene preeclampsia. Esta es una afeccin que provoca el aumento de la presin arterial y otros sntomas, como hinchazn y dolores de cabeza.  Tendr un PVDC menos de 19meses despus de un parto por cesrea.  Ya pas la fecha prevista del parto.  Necesita que se inicie el trabajo de parto (induccin) porque el cuello uterino no est listo para   el parto (desfavorable). Dnde encontrar ms informacin  Asociacin Americana del Embarazo (American Pregnancy Association): americanpregnancy.org.  Congreso Estadounidense de Obstetras y Gineclogos (American College of Obstetricians and Gynecologists): acog.org Resumen  Un parto vaginal  despus de un parto por cesrea (PVDC) es dar a luz por la vagina despus de haber dado a luz por medio de una intervencin quirrgica llamada cesrea. Es posible que el PVDC sea una alternativa segura para usted, segn su salud y otros factores.  Hable con el mdico desde comienzos del embarazo para que pueda comprender los riesgos, los beneficios y las alternativas, y tener mucho tiempo para planificar el parto.  El principal riesgo de intentar tener un PVDC es que existe una posibilidad de que falle, lo que obligar al mdico a realizar una cesrea. Los otros riesgos son muy poco frecuentes. Esta informacin no tiene como fin reemplazar el consejo del mdico. Asegrese de hacerle al mdico cualquier pregunta que tenga. Document Revised: 08/15/2016 Document Reviewed: 08/15/2016 Elsevier Patient Education  2020 Elsevier Inc.  

## 2019-07-22 NOTE — MAU Provider Note (Addendum)
S: Ms. Sharon Hess is a 24 y.o. [id number] at [redacted]w[redacted]d  who presents to MAU today complaining of leaking of fluid since 6 pm. She denies vaginal bleeding. She denies contractions. She reports normal fetal movement.  Last intercourse was at 3 pm.   O: BP 137/84 (BP Location: Right Arm)   Pulse (!) 104   Temp 98.9 F (37.2 C) (Oral)   Resp 18   Wt 114.3 kg   LMP  (LMP Unknown)   BMI 47.61 kg/m  GENERAL: Well-developed, well-nourished female in no acute distress.  HEAD: Normocephalic, atraumatic.  CHEST: Normal effort of breathing, regular heart rate ABDOMEN: Soft, nontender, gravid PELVIC: Normal external female genitalia. Vagina is pink and rugated. Cervix with normal contour, no lesions. Normal discharge.  Negative pooling   Cervical exam: 1-2 cm, posterior, thick, ballotable.    Fern slide and sterile fern slide x 2 are negative for ferning; present sperm.   Fetal Monitoring: Baseline: 150 bpm Variability: mod var Accelerations: present Decelerations: negative Contractions: irregular  No results found for this or any previous visit (from the past 24 hour(s)).   A: [facility] at [redacted]w[redacted]d  Membranes intact  P: Discharge home with strict return precautions on when to return, patient planning to keep next appt on 07-30-2019.  Marylene Land, CNM 07/22/2019 8:35 PM Sharon Hess 07/22/2019, 8:35 PM

## 2019-07-30 ENCOUNTER — Ambulatory Visit (INDEPENDENT_AMBULATORY_CARE_PROVIDER_SITE_OTHER): Payer: Self-pay | Admitting: Obstetrics & Gynecology

## 2019-07-30 ENCOUNTER — Other Ambulatory Visit: Payer: Self-pay | Admitting: Obstetrics & Gynecology

## 2019-07-30 ENCOUNTER — Other Ambulatory Visit: Payer: Self-pay

## 2019-07-30 ENCOUNTER — Telehealth: Payer: Self-pay

## 2019-07-30 VITALS — BP 137/79 | HR 79 | Wt 253.3 lb

## 2019-07-30 DIAGNOSIS — O0990 Supervision of high risk pregnancy, unspecified, unspecified trimester: Secondary | ICD-10-CM

## 2019-07-30 DIAGNOSIS — Z603 Acculturation difficulty: Secondary | ICD-10-CM

## 2019-07-30 DIAGNOSIS — Z3A40 40 weeks gestation of pregnancy: Secondary | ICD-10-CM

## 2019-07-30 DIAGNOSIS — Z98891 History of uterine scar from previous surgery: Secondary | ICD-10-CM

## 2019-07-30 DIAGNOSIS — O0993 Supervision of high risk pregnancy, unspecified, third trimester: Secondary | ICD-10-CM

## 2019-07-30 NOTE — Telephone Encounter (Signed)
Called pt using [facility] id # [phone] to advise of Induction date of 08/06/19 & that some one will call with the time/ Pt verbalized understanding.

## 2019-07-30 NOTE — Progress Notes (Signed)
   PRENATAL VISIT NOTE  Subjective:  Sharon Hess is a 24 y.o. G3P2002 at [redacted]w[redacted]d being seen today for ongoing prenatal care.  She is currently monitored for the following issues for this high-risk pregnancy and has Fatty liver disease, nonalcoholic; Low back pain; Right elbow pain; Supervision of high risk pregnancy, antepartum; Obesity affecting pregnancy; History of cesarean section; Hepatitis B affecting pregnancy in third trimester; and Language barrier on their problem list.  Patient reports no complaints.  Contractions: Not present. Vag. Bleeding: None.  Movement: Present. Denies leaking of fluid.   The following portions of the patient's history were reviewed and updated as appropriate: allergies, current medications, past family history, past medical history, past social history, past surgical history and problem list.   Objective:   Vitals:   07/30/19 0819  BP: 137/79  Pulse: 79  Weight: 114.9 kg    Fetal Status: Fetal Heart Rate (bpm): 147   Movement: Present     General:  Alert, oriented and cooperative. Patient is in no acute distress.  Skin: Skin is warm and dry. No rash noted.   Cardiovascular: Normal heart rate noted  Respiratory: Normal respiratory effort, no problems with respiration noted  Abdomen: Soft, gravid, appropriate for gestational age.  Pain/Pressure: Present     Pelvic: Cervical exam performed in the presence of a chaperone        Extremities: Normal range of motion.  Edema: Trace  Mental Status: Normal mood and affect. Normal behavior. Normal judgment and thought content.   Assessment and Plan:  Pregnancy: G3P2002 at [redacted]w[redacted]d There are no diagnoses linked to this encounter. Term labor symptoms and general obstetric precautions including but not limited to vaginal bleeding, contractions, leaking of fluid and fetal movement were reviewed in detail with the patient. Please refer to After Visit Summary for other counseling recommendations.  History  of cesarean section  Supervision of high risk pregnancy, antepartum   Return in about 5 days (around 08/04/2019) for NST AFI.  No future appointments.  Scheryl Darter, MD

## 2019-07-30 NOTE — Patient Instructions (Signed)
Induccin del trabajo de parto Labor Induction  Se denomina induccin del trabajo de parto cuando se inician acciones para hacer que una mujer embarazada comience el trabajo de parto. La mayora de las mujeres comienzan el trabajo de parto de forma natural entre las semanas 37 y 42 del embarazo. Cuando esto no ocurre o cuando por necesidad mdica se debe iniciarlo por otros medios, se podran utilizar diferentes mtodos. La induccin del trabajo de parto hace que el tero se contraiga. Tambin hace que el cuello uterino se ablande (madure), se abra (se dilate), y se afine (se borre). Generalmente, el trabajo de parto no se induce antes de las 39semanas, excepto que haya un motivo mdico para hacerlo. El mdico determinar si se debe inducir el parto. Antes de inducir el trabajo de parto, el mdico considerar ciertos factores; entre ellos:  Su cuadro clnico y el del beb.  En cul semana del embarazo se encuentra.  La madurez de los pulmones del beb.  El estado del cuello uterino.  La posicin del beb.  El tamao del canal del parto. Cules son algunos de los motivos para inducir un parto? Se podra inducir un parto en los siguientes casos:  Su salud o la salud del beb estn en riesgo.  El embarazo se pas de trmino 1semana o ms.  Rompi la bolsa, pero no se inici el trabajo de parto de forma natural.  La cantidad de lquido amnitico que rodea al beb es poca. Tambin podra optar por (elegir) que le induzcan el trabajo de parto en un determinado momento. Por lo general, la induccin del trabajo de parto por eleccin no se hace antes de las 39semanas del embarazo. Qu mtodos se usan para inducir el trabajo de parto? Los mtodos utilizados para inducir el trabajo de parto incluyen los siguientes:  Administracin de prostaglandina. Este medicamento hace que se inicien las contracciones y que el cuello uterino se dilate y aliste. Puede tomarse por boca (de forma oral) o  introducirse en la vagina (supositorio).  Insercin de un pequeo tubo delgado (catter) que tiene un baln en el extremo en la vagina; luego, expansin del baln con agua para dilatar el cuello uterino.  Ruptura de las membranas. En este mtodo, el mdico separa, con cuidado, el tejido del saco amnitico del cuello uterino. En consecuencia, el cuello uterino se expande, lo que, a la vez, provoca la liberacin de una hormona llamada progesterona. Esta hormona hace que el tero se contraiga. Este procedimiento suele realizarse en el consultorio del mdico; luego, la enviarn a su hogar para esperar a que comiencen las contracciones.  Romper la bolsa de las aguas. En este mtodo, el mdico usa un pequeo instrumento para hacer un pequeo orificio en el saco amnitico. Al cabo de un tiempo, esto har que el saco amnitico se rompa. Las contracciones deberan comenzar algunas horas despus.  Medicamentos que desencadenen o intensifiquen las contracciones. Estos se administran a travs de una va intravenosa (IV) que se coloca en una vena del brazo. Excepto la ruptura de las membranas, que puede realizarse en una clnica, la induccin del trabajo de parto se realiza en el hospital para que puedan controlarlos con atencin a usted y al beb. Cunto tiempo lleva inducir el trabajo de parto? La duracin del proceso de induccin depende de la preparacin del cuerpo para el trabajo de parto. Algunas inducciones pueden durar hasta 2 o 3das, mientras que otras podran durar menos de un da. La induccin podra durar ms en los   siguientes casos:  La induccin se hace en una etapa temprana del embarazo.  Es el primer embarazo de la madre.  El cuello uterino no est listo. Cules son algunos de los riesgos asociados con la induccin del trabajo de parto? Algunos de los riesgos asociados con la induccin del trabajo de parto son los siguientes:  Cambios en la frecuencia cardaca fetal, por ejemplo, los  latidos son demasiado rpidos o lentos, o son irregulares (errticos).  Fracaso de la induccin.  Infeccin en la madre o el beb.  Aumento de la posibilidad de que sea necesaria una cesrea.  Muerte fetal.  Ruptura (desprendimiento) de la placenta del tero (raro).  Ruptura del tero (muy poco frecuente). Cuando es necesario realizar una induccin por motivos mdicos, los beneficios suelen superar los riesgos. Cules son algunos de los motivos para no inducir el trabajo de parto? La induccin no debe realizarse si:  El beb no tolera las contracciones.  Se someti anteriormente a cirugas en el tero, como una miomectoma, le extirparon fibromas o tiene una cicatriz vertical de un parto por cesrea anterior.  La placenta est en una posicin muy baja en el tero y obstruye la abertura del cuello uterino (placenta previa).  El beb no est ubicado con la cabeza hacia bajo.  El cordn umbilical cae hacia el canal del parto, adelante del beb.  Hay circunstancias poco habituales, como que se encuentre en una etapa muy temprana del embarazo (beb prematuro).  Tuvo ms de 2partos por cesrea anteriormente. Resumen  Se denomina induccin del trabajo de parto cuando se inician acciones para hacer que una mujer embarazada comience el trabajo de parto.  La induccin del trabajo de parto hace que el tero se contraiga. Tambin hace que el cuello uterino se aliste, se dilate y se borre.  El trabajo de parto no se induce antes de las 39semanas de gestacin, excepto que haya un motivo mdico para hacerlo.  Cuando es necesario realizar una induccin por motivos mdicos, los beneficios suelen superar los riesgos. Esta informacin no tiene como fin reemplazar el consejo del mdico. Asegrese de hacerle al mdico cualquier pregunta que tenga. Document Revised: 09/19/2016 Document Reviewed: 09/19/2016 Elsevier Patient Education  2020 Elsevier Inc.   

## 2019-07-31 ENCOUNTER — Telehealth (HOSPITAL_COMMUNITY): Payer: Self-pay | Admitting: *Deleted

## 2019-07-31 ENCOUNTER — Encounter (HOSPITAL_COMMUNITY): Payer: Self-pay | Admitting: *Deleted

## 2019-07-31 NOTE — Telephone Encounter (Signed)
[id number] interpreter number  Preadmission screen

## 2019-08-02 ENCOUNTER — Other Ambulatory Visit: Payer: Self-pay | Admitting: Advanced Practice Midwife

## 2019-08-04 ENCOUNTER — Ambulatory Visit (INDEPENDENT_AMBULATORY_CARE_PROVIDER_SITE_OTHER): Payer: Self-pay

## 2019-08-04 ENCOUNTER — Ambulatory Visit (INDEPENDENT_AMBULATORY_CARE_PROVIDER_SITE_OTHER): Payer: Self-pay | Admitting: *Deleted

## 2019-08-04 ENCOUNTER — Other Ambulatory Visit (HOSPITAL_COMMUNITY)
Admission: RE | Admit: 2019-08-04 | Discharge: 2019-08-04 | Disposition: A | Discharge status: Home / Self Care | Payer: Self-pay | Source: Ambulatory Visit | Attending: Family Medicine | Admitting: Family Medicine

## 2019-08-04 ENCOUNTER — Other Ambulatory Visit: Payer: Self-pay

## 2019-08-04 DIAGNOSIS — Z01812 Encounter for preprocedural laboratory examination: Secondary | ICD-10-CM | POA: Insufficient documentation

## 2019-08-04 DIAGNOSIS — Z3A40 40 weeks gestation of pregnancy: Secondary | ICD-10-CM

## 2019-08-04 DIAGNOSIS — Z20822 Contact with and (suspected) exposure to covid-19: Secondary | ICD-10-CM | POA: Insufficient documentation

## 2019-08-04 DIAGNOSIS — O480 Post-term pregnancy: Secondary | ICD-10-CM

## 2019-08-04 LAB — SARS CORONAVIRUS 2 (TAT 6-24 HRS): SARS Coronavirus 2: NEGATIVE

## 2019-08-04 NOTE — Progress Notes (Signed)
Interpreter Sharon Hess present for encounter. Sharon Hess is scheduled for IOL on 7/14. She did not keep appt today for Covid testing due to she did not understand the telephone message she received. Sharon Hess was advised to go to [address] upon leaving office today for pre-admission Covid test. She voiced understanding and agreed.   Sharon Hess informed that the ultrasound is considered a limited OB ultrasound and is not intended to be a complete ultrasound exam.  Patient also informed that the ultrasound is not being completed with the intent of assessing for fetal or placental anomalies or any pelvic abnormalities.  Explained that the purpose of today's ultrasound is to assess for presentation, BPP and amniotic fluid volume.  Patient acknowledges the purpose of the exam and the limitations of the study.

## 2019-08-06 ENCOUNTER — Inpatient Hospital Stay (HOSPITAL_COMMUNITY): Payer: Medicaid Other | Admitting: Anesthesiology

## 2019-08-06 ENCOUNTER — Inpatient Hospital Stay (HOSPITAL_COMMUNITY): Payer: Medicaid Other

## 2019-08-06 ENCOUNTER — Encounter (HOSPITAL_COMMUNITY): Payer: Self-pay | Admitting: Obstetrics & Gynecology

## 2019-08-06 ENCOUNTER — Inpatient Hospital Stay (HOSPITAL_COMMUNITY)
Admission: AD | Admit: 2019-08-06 | Discharge: 2019-08-08 | DRG: 768 | Disposition: A | Discharge status: Home / Self Care | Payer: Medicaid Other | Attending: Obstetrics and Gynecology | Admitting: Obstetrics and Gynecology

## 2019-08-06 ENCOUNTER — Other Ambulatory Visit: Payer: Self-pay

## 2019-08-06 DIAGNOSIS — O2662 Liver and biliary tract disorders in childbirth: Secondary | ICD-10-CM | POA: Diagnosis present

## 2019-08-06 DIAGNOSIS — Z8759 Personal history of other complications of pregnancy, childbirth and the puerperium: Secondary | ICD-10-CM

## 2019-08-06 DIAGNOSIS — O99210 Obesity complicating pregnancy, unspecified trimester: Secondary | ICD-10-CM | POA: Diagnosis present

## 2019-08-06 DIAGNOSIS — O34219 Maternal care for unspecified type scar from previous cesarean delivery: Secondary | ICD-10-CM | POA: Diagnosis present

## 2019-08-06 DIAGNOSIS — O98413 Viral hepatitis complicating pregnancy, third trimester: Secondary | ICD-10-CM | POA: Diagnosis present

## 2019-08-06 DIAGNOSIS — O99214 Obesity complicating childbirth: Secondary | ICD-10-CM | POA: Diagnosis present

## 2019-08-06 DIAGNOSIS — O1490 Unspecified pre-eclampsia, unspecified trimester: Secondary | ICD-10-CM | POA: Insufficient documentation

## 2019-08-06 DIAGNOSIS — O9081 Anemia of the puerperium: Secondary | ICD-10-CM | POA: Diagnosis not present

## 2019-08-06 DIAGNOSIS — O480 Post-term pregnancy: Principal | ICD-10-CM | POA: Diagnosis present

## 2019-08-06 DIAGNOSIS — O134 Gestational [pregnancy-induced] hypertension without significant proteinuria, complicating childbirth: Secondary | ICD-10-CM | POA: Diagnosis present

## 2019-08-06 DIAGNOSIS — D62 Acute posthemorrhagic anemia: Secondary | ICD-10-CM | POA: Diagnosis not present

## 2019-08-06 DIAGNOSIS — Z20822 Contact with and (suspected) exposure to covid-19: Secondary | ICD-10-CM | POA: Diagnosis present

## 2019-08-06 DIAGNOSIS — Z3A41 41 weeks gestation of pregnancy: Secondary | ICD-10-CM

## 2019-08-06 DIAGNOSIS — Z98891 History of uterine scar from previous surgery: Secondary | ICD-10-CM

## 2019-08-06 DIAGNOSIS — Z758 Other problems related to medical facilities and other health care: Secondary | ICD-10-CM | POA: Diagnosis present

## 2019-08-06 DIAGNOSIS — Z789 Other specified health status: Secondary | ICD-10-CM | POA: Diagnosis present

## 2019-08-06 DIAGNOSIS — O0990 Supervision of high risk pregnancy, unspecified, unspecified trimester: Secondary | ICD-10-CM

## 2019-08-06 DIAGNOSIS — B1910 Unspecified viral hepatitis B without hepatic coma: Secondary | ICD-10-CM | POA: Diagnosis present

## 2019-08-06 DIAGNOSIS — K760 Fatty (change of) liver, not elsewhere classified: Secondary | ICD-10-CM | POA: Diagnosis present

## 2019-08-06 DIAGNOSIS — M25521 Pain in right elbow: Secondary | ICD-10-CM

## 2019-08-06 LAB — PROTEIN / CREATININE RATIO, URINE
Creatinine, Urine: 165.32 mg/dL
Protein Creatinine Ratio: 1.49 mg/mg{Cre} — ABNORMAL HIGH (ref 0.00–0.15)
Total Protein, Urine: 246 mg/dL

## 2019-08-06 LAB — DIC (DISSEMINATED INTRAVASCULAR COAGULATION)PANEL
D-Dimer, Quant: 2.57 ug/mL-FEU — ABNORMAL HIGH (ref 0.00–0.50)
Fibrinogen: 399 mg/dL (ref 210–475)
INR: 1.1 (ref 0.8–1.2)
Platelets: 189 10*3/uL (ref 150–400)
Prothrombin Time: 13.4 seconds (ref 11.4–15.2)
Smear Review: NONE SEEN
aPTT: 28 seconds (ref 24–36)

## 2019-08-06 LAB — CBC
HCT: 40 % (ref 36.0–46.0)
HCT: 39 % (ref 36.0–46.0)
Hemoglobin: 13.1 g/dL (ref 12.0–15.0)
Hemoglobin: 12.7 g/dL (ref 12.0–15.0)
MCH: 28.2 pg (ref 26.0–34.0)
MCH: 28 pg (ref 26.0–34.0)
MCHC: 32.8 g/dL (ref 30.0–36.0)
MCHC: 32.6 g/dL (ref 30.0–36.0)
MCV: 86.2 fL (ref 80.0–100.0)
MCV: 86.1 fL (ref 80.0–100.0)
Platelets: 208 10*3/uL (ref 150–400)
Platelets: 182 10*3/uL (ref 150–400)
RBC: 4.64 MIL/uL (ref 3.87–5.11)
RBC: 4.53 MIL/uL (ref 3.87–5.11)
RDW: 15.2 % (ref 11.5–15.5)
RDW: 15.3 % (ref 11.5–15.5)
WBC: 8.4 10*3/uL (ref 4.0–10.5)
WBC: 15.8 10*3/uL — ABNORMAL HIGH (ref 4.0–10.5)
nRBC: 0 % (ref 0.0–0.2)
nRBC: 0 % (ref 0.0–0.2)

## 2019-08-06 LAB — RPR: RPR Ser Ql: NONREACTIVE

## 2019-08-06 LAB — POSTPARTUM HEMORRHAGE PROTOCOL (BB NOTIFICATION)

## 2019-08-06 MED ORDER — COCONUT OIL OIL: 1.0000 "application " | TOPICAL_OIL | Status: DC | PRN

## 2019-08-06 MED ORDER — MISOPROSTOL 200 MCG PO TABS
800.0000 ug | ORAL_TABLET | Freq: Once | ORAL | Status: AC
  Administered 2019-08-06: 800 ug via RECTAL

## 2019-08-06 MED ORDER — SENNOSIDES-DOCUSATE SODIUM 8.6-50 MG PO TABS
2.0000 | ORAL_TABLET | ORAL | Status: DC
  Administered 2019-08-06 – 2019-08-07 (×2): 2 via ORAL
  Filled 2019-08-06 (×2): qty 2

## 2019-08-06 MED ORDER — OXYTOCIN BOLUS FROM INFUSION
333.0000 mL | Freq: Once | INTRAVENOUS | Status: AC
  Administered 2019-08-06: 333 mL via INTRAVENOUS

## 2019-08-06 MED ORDER — TRANEXAMIC ACID-NACL 1000-0.7 MG/100ML-% IV SOLN
INTRAVENOUS | Status: AC
  Administered 2019-08-06: 1000 mg
  Filled 2019-08-06: qty 100

## 2019-08-06 MED ORDER — SIMETHICONE 80 MG PO CHEW: 80.0000 mg | CHEWABLE_TABLET | ORAL | Status: DC | PRN

## 2019-08-06 MED ORDER — LACTATED RINGERS IV SOLN
500.0000 mL | INTRAVENOUS | Status: DC | PRN
  Administered 2019-08-06: 500 mL via INTRAVENOUS

## 2019-08-06 MED ORDER — ONDANSETRON HCL 4 MG PO TABS: 4.0000 mg | ORAL_TABLET | ORAL | Status: DC | PRN

## 2019-08-06 MED ORDER — LACTATED RINGERS IV SOLN: 500.0000 mL | Freq: Once | INTRAVENOUS | Status: DC

## 2019-08-06 MED ORDER — TERBUTALINE SULFATE 1 MG/ML IJ SOLN: 0.2500 mg | Freq: Once | INTRAMUSCULAR | Status: DC | PRN

## 2019-08-06 MED ORDER — TETANUS-DIPHTH-ACELL PERTUSSIS 5-2.5-18.5 LF-MCG/0.5 IM SUSP: 0.5000 mL | Freq: Once | INTRAMUSCULAR | Status: DC

## 2019-08-06 MED ORDER — LACTATED RINGERS IV SOLN
500.0000 mL | Freq: Once | INTRAVENOUS | Status: AC
  Administered 2019-08-06: 500 mL via INTRAVENOUS

## 2019-08-06 MED ORDER — WITCH HAZEL-GLYCERIN EX PADS: 1.0000 "application " | MEDICATED_PAD | CUTANEOUS | Status: DC | PRN

## 2019-08-06 MED ORDER — TRANEXAMIC ACID-NACL 1000-0.7 MG/100ML-% IV SOLN: 1000.0000 mg | Freq: Once | INTRAVENOUS | Status: DC

## 2019-08-06 MED ORDER — FENTANYL CITRATE (PF) 100 MCG/2ML IJ SOLN
INTRAMUSCULAR | Status: DC | PRN
  Administered 2019-08-06: 15 ug via INTRATHECAL

## 2019-08-06 MED ORDER — MISOPROSTOL 200 MCG PO TABS
ORAL_TABLET | ORAL | Status: AC
  Filled 2019-08-06: qty 4

## 2019-08-06 MED ORDER — METHYLERGONOVINE MALEATE 0.2 MG/ML IJ SOLN
INTRAMUSCULAR | Status: AC
  Administered 2019-08-06: 0.2 mg
  Filled 2019-08-06: qty 1

## 2019-08-06 MED ORDER — EPHEDRINE 5 MG/ML INJ: 10.0000 mg | INTRAVENOUS | Status: DC | PRN

## 2019-08-06 MED ORDER — LACTATED RINGERS IV SOLN: INTRAVENOUS | Status: DC

## 2019-08-06 MED ORDER — ACETAMINOPHEN 325 MG PO TABS
650.0000 mg | ORAL_TABLET | Freq: Four times a day (QID) | ORAL | Status: DC | PRN
  Administered 2019-08-07 (×2): 650 mg via ORAL

## 2019-08-06 MED ORDER — CARBOPROST TROMETHAMINE 250 MCG/ML IM SOLN
INTRAMUSCULAR | Status: AC
  Filled 2019-08-06: qty 1

## 2019-08-06 MED ORDER — LACTATED RINGERS IV BOLUS
1000.0000 mL | Freq: Once | INTRAVENOUS | Status: AC
  Administered 2019-08-06: 1000 mL via INTRAVENOUS

## 2019-08-06 MED ORDER — PHENYLEPHRINE 40 MCG/ML (10ML) SYRINGE FOR IV PUSH (FOR BLOOD PRESSURE SUPPORT): 80.0000 ug | PREFILLED_SYRINGE | INTRAVENOUS | Status: DC | PRN

## 2019-08-06 MED ORDER — ONDANSETRON HCL 4 MG/2ML IJ SOLN
4.0000 mg | Freq: Four times a day (QID) | INTRAMUSCULAR | Status: DC | PRN
  Administered 2019-08-06: 4 mg via INTRAVENOUS
  Filled 2019-08-06: qty 2

## 2019-08-06 MED ORDER — METHYLERGONOVINE MALEATE 0.2 MG/ML IJ SOLN
0.2000 mg | Freq: Once | INTRAMUSCULAR | Status: AC
  Administered 2019-08-06: 0.2 mg via INTRAMUSCULAR
  Filled 2019-08-06: qty 1

## 2019-08-06 MED ORDER — BUPIVACAINE HCL (PF) 0.25 % IJ SOLN
INTRAMUSCULAR | Status: DC | PRN
  Administered 2019-08-06: 1 mL via INTRATHECAL

## 2019-08-06 MED ORDER — DIPHENHYDRAMINE HCL 50 MG/ML IJ SOLN: 12.5000 mg | INTRAMUSCULAR | Status: DC | PRN

## 2019-08-06 MED ORDER — DIPHENOXYLATE-ATROPINE 2.5-0.025 MG PO TABS
2.0000 | ORAL_TABLET | Freq: Once | ORAL | Status: AC
  Administered 2019-08-06: 2 via ORAL

## 2019-08-06 MED ORDER — PRENATAL MULTIVITAMIN CH
1.0000 | ORAL_TABLET | Freq: Every day | ORAL | Status: DC
  Administered 2019-08-07: 1 via ORAL
  Filled 2019-08-06: qty 1

## 2019-08-06 MED ORDER — TRANEXAMIC ACID-NACL 1000-0.7 MG/100ML-% IV SOLN
1000.0000 mg | INTRAVENOUS | Status: AC
  Administered 2019-08-06: 1000 mg via INTRAVENOUS

## 2019-08-06 MED ORDER — MEDROXYPROGESTERONE ACETATE 150 MG/ML IM SUSP
150.0000 mg | Freq: Once | INTRAMUSCULAR | Status: AC
  Administered 2019-08-08: 150 mg via INTRAMUSCULAR
  Filled 2019-08-06: qty 1

## 2019-08-06 MED ORDER — BENZOCAINE-MENTHOL 20-0.5 % EX AERO
1.0000 "application " | INHALATION_SPRAY | CUTANEOUS | Status: DC | PRN
  Filled 2019-08-06: qty 56

## 2019-08-06 MED ORDER — PHENYLEPHRINE 40 MCG/ML (10ML) SYRINGE FOR IV PUSH (FOR BLOOD PRESSURE SUPPORT)
80.0000 ug | PREFILLED_SYRINGE | INTRAVENOUS | Status: DC | PRN
  Administered 2019-08-06 (×2): 80 ug via INTRAVENOUS
  Filled 2019-08-06: qty 10

## 2019-08-06 MED ORDER — ONDANSETRON HCL 4 MG/2ML IJ SOLN: 4.0000 mg | INTRAMUSCULAR | Status: DC | PRN

## 2019-08-06 MED ORDER — SODIUM CHLORIDE (PF) 0.9 % IJ SOLN
INTRAMUSCULAR | Status: DC | PRN
  Administered 2019-08-06: 10 mL/h via EPIDURAL

## 2019-08-06 MED ORDER — NALBUPHINE HCL 10 MG/ML IJ SOLN
5.0000 mg | Freq: Once | INTRAMUSCULAR | Status: AC
  Administered 2019-08-06: 5 mg via INTRAVENOUS
  Filled 2019-08-06: qty 1

## 2019-08-06 MED ORDER — IBUPROFEN 600 MG PO TABS
600.0000 mg | ORAL_TABLET | Freq: Three times a day (TID) | ORAL | Status: DC | PRN
  Administered 2019-08-07 – 2019-08-08 (×3): 600 mg via ORAL
  Filled 2019-08-06 (×3): qty 1

## 2019-08-06 MED ORDER — DIPHENHYDRAMINE HCL 25 MG PO CAPS: 25.0000 mg | ORAL_CAPSULE | Freq: Four times a day (QID) | ORAL | Status: DC | PRN

## 2019-08-06 MED ORDER — METHYLERGONOVINE MALEATE 0.2 MG/ML IJ SOLN
0.2000 mg | Freq: Once | INTRAMUSCULAR | Status: AC
  Administered 2019-08-06: 0.2 mg via INTRAMUSCULAR

## 2019-08-06 MED ORDER — OXYTOCIN-SODIUM CHLORIDE 30-0.9 UT/500ML-% IV SOLN
2.5000 [IU]/h | INTRAVENOUS | Status: DC
  Administered 2019-08-06: 2.5 [IU]/h via INTRAVENOUS
  Filled 2019-08-06 (×2): qty 500

## 2019-08-06 MED ORDER — MEASLES, MUMPS & RUBELLA VAC IJ SOLR: 0.5000 mL | Freq: Once | INTRAMUSCULAR | Status: DC

## 2019-08-06 MED ORDER — FENTANYL CITRATE (PF) 100 MCG/2ML IJ SOLN
100.0000 ug | INTRAMUSCULAR | Status: DC | PRN
  Administered 2019-08-06 (×8): 100 ug via INTRAVENOUS
  Filled 2019-08-06 (×9): qty 2

## 2019-08-06 MED ORDER — OXYTOCIN-SODIUM CHLORIDE 30-0.9 UT/500ML-% IV SOLN
1.0000 m[IU]/min | INTRAVENOUS | Status: DC
  Administered 2019-08-06: 2 m[IU]/min via INTRAVENOUS
  Filled 2019-08-06: qty 500

## 2019-08-06 MED ORDER — OXYCODONE-ACETAMINOPHEN 5-325 MG PO TABS: 2.0000 | ORAL_TABLET | ORAL | Status: DC | PRN

## 2019-08-06 MED ORDER — ACETAMINOPHEN 325 MG PO TABS
650.0000 mg | ORAL_TABLET | ORAL | Status: DC | PRN
  Filled 2019-08-06 (×2): qty 2

## 2019-08-06 MED ORDER — TRANEXAMIC ACID-NACL 1000-0.7 MG/100ML-% IV SOLN
1000.0000 mg | Freq: Once | INTRAVENOUS | Status: AC | PRN
  Administered 2019-08-06: 1000 mg via INTRAVENOUS
  Filled 2019-08-06: qty 100

## 2019-08-06 MED ORDER — OXYCODONE-ACETAMINOPHEN 5-325 MG PO TABS: 1.0000 | ORAL_TABLET | ORAL | Status: DC | PRN

## 2019-08-06 MED ORDER — CARBOPROST TROMETHAMINE 250 MCG/ML IM SOLN
250.0000 ug | Freq: Once | INTRAMUSCULAR | Status: AC
  Administered 2019-08-06: 250 ug via INTRAMUSCULAR

## 2019-08-06 MED ORDER — DIBUCAINE (PERIANAL) 1 % EX OINT: 1.0000 "application " | TOPICAL_OINTMENT | CUTANEOUS | Status: DC | PRN

## 2019-08-06 MED ORDER — SOD CITRATE-CITRIC ACID 500-334 MG/5ML PO SOLN: 30.0000 mL | ORAL | Status: DC | PRN

## 2019-08-06 MED ORDER — FENTANYL-BUPIVACAINE-NACL 0.5-0.125-0.9 MG/250ML-% EP SOLN
12.0000 mL/h | EPIDURAL | Status: DC | PRN
  Filled 2019-08-06: qty 250

## 2019-08-06 MED ORDER — LIDOCAINE HCL (PF) 1 % IJ SOLN: 30.0000 mL | INTRAMUSCULAR | Status: DC | PRN

## 2019-08-06 NOTE — Anesthesia Preprocedure Evaluation (Signed)
Anesthesia Evaluation  Patient identified by MRN, date of birth, ID band Patient awake    Reviewed: Allergy & Precautions, NPO status , Patient's Chart, lab work & pertinent test results  History of Anesthesia Complications Negative for: history of anesthetic complications  Airway Mallampati: II  TM Distance: >3 FB Neck ROM: Full    Dental no notable dental hx.    Pulmonary neg pulmonary ROS,    Pulmonary exam normal        Cardiovascular negative cardio ROS Normal cardiovascular exam     Neuro/Psych negative neurological ROS  negative psych ROS   GI/Hepatic negative GI ROS, (+) Hepatitis -, B  Endo/Other  Morbid obesity  Renal/GU negative Renal ROS     Musculoskeletal negative musculoskeletal ROS (+)   Abdominal   Peds  Hematology negative hematology ROS (+)   Anesthesia Other Findings   Reproductive/Obstetrics (+) Pregnancy                             Anesthesia Physical Anesthesia Plan  ASA: III  Anesthesia Plan: Epidural   Post-op Pain Management:    Induction:   PONV Risk Score and Plan: Treatment may vary due to age or medical condition  Airway Management Planned: Natural Airway  Additional Equipment:   Intra-op Plan:   Post-operative Plan:   Informed Consent: I have reviewed the patients History and Physical, chart, labs and discussed the procedure including the risks, benefits and alternatives for the proposed anesthesia with the patient or authorized representative who has indicated his/her understanding and acceptance.       Plan Discussed with:   Anesthesia Plan Comments:         Anesthesia Quick Evaluation

## 2019-08-06 NOTE — MAU Note (Signed)
.  [facility] Sharon Hess is a 24 y.o. at [redacted]w[redacted]d here in MAU reporting: contractions every 7-10 mins  Denies LOF/VB Pain score: 10

## 2019-08-06 NOTE — Progress Notes (Addendum)
LABOR PROGRESS NOTE  Sharon Hess is a 24 y.o. G3P2002 at [redacted]w[redacted]d  admitted for SOL.  Subjective: Patient is doing well. She is feeling more pressure with her contractions. FOB at bedside. Pain is controlled.  Objective: BP 131/83   Pulse 82   Temp 98.1 F (36.7 C) (Oral)   Resp 18   Ht 5\' 1"  (1.549 m)   Wt 113 kg   LMP  (LMP Unknown)   SpO2 99%   BMI 47.09 kg/m  or  Vitals:   08/06/19 1001 08/06/19 1039 08/06/19 1101 08/06/19 1207  BP: (!) 140/94  (!) 145/83 131/83  Pulse: 88  77 82  Resp:   18   Temp:  98.1 F (36.7 C)    TempSrc:  Oral    SpO2:      Weight:      Height:        Dilation: 8 Effacement (%): 90 Cervical Position: Anterior Station: 0 Presentation: Vertex Exam by:: Dr. [email or web address]: Baseline rate 145, moderate varibility, + acel, - decel Toco: Contractions q 1-6 minutes  Labs: Lab Results  Component Value Date   WBC 8.4 08/06/2019   HGB 13.1 08/06/2019   HCT 40.0 08/06/2019   MCV 86.2 08/06/2019   PLT 208 08/06/2019    Patient Active Problem List   Diagnosis Date Noted  . Patient desires vaginal birth after cesarean section (VBAC) 08/06/2019  . Language barrier 04/28/2019  . Hepatitis B affecting pregnancy in third trimester 02/12/2019  . Supervision of high risk pregnancy, antepartum   . Obesity affecting pregnancy   . History of cesarean section   . Right elbow pain 11/29/2017  . Low back pain 12/01/2014  . Fatty liver disease, nonalcoholic 07/13/2014    Assessment / Plan: 24 y.o. G3P2002 at [redacted]w[redacted]d here for SOL. H/o VBAC x1 after pLTCS 2/2 NRFHT.  Labor: Progressing. Will continue expectant management.  Fetal Wellbeing:  Reactive, Cat 1 tracing Pain Control:  Maternal support; Fentanyl PRN  Anticipated MOD:  NSVD  [redacted]w[redacted]d, MD Family Medicine, PGY-3 08/06/2019, 12:21 PM

## 2019-08-06 NOTE — Anesthesia Procedure Notes (Signed)
Epidural Patient location during procedure: OB Start time: 08/06/2019 5:03 PM End time: 08/06/2019 5:06 PM  Staffing Anesthesiologist: Kaylyn Layer, MD Performed: anesthesiologist   Preanesthetic Checklist Completed: patient identified, IV checked, risks and benefits discussed, monitors and equipment checked, pre-op evaluation and timeout performed  Epidural Patient position: sitting Prep: DuraPrep and site prepped and draped Patient monitoring: heart rate, continuous pulse ox and blood pressure Approach: midline Location: L3-L4 Injection technique: LOR air  Needle:  Needle type: Tuohy  Needle gauge: 17 G Needle length: 9 cm Needle insertion depth: 7 cm Catheter type: closed end flexible Catheter size: 19 Gauge Catheter at skin depth: 12 cm  Assessment Events: blood not aspirated, injection not painful, no injection resistance, no paresthesia and negative IV test  Additional Notes Patient identified. Risks, benefits, and alternatives discussed with patient including but not limited to bleeding, infection, nerve damage, paralysis, failed block, incomplete pain control, headache, blood pressure changes, nausea, vomiting, reactions to medication, itching, and postpartum back pain. Confirmed with bedside nurse the patient's most recent platelet count. Confirmed with patient that they are not currently taking any anticoagulation, have any bleeding history, or any family history of bleeding disorders. Patient expressed understanding and wished to proceed. All questions were answered. Sterile technique was used throughout the entire procedure. Please see nursing notes for vital signs.   Crisp LOR with Tuohy needle after one needle redirection. [place] 25g spinal needle introduced through Tuohy with clear CSF return prior to injection of intrathecal medication. Spinal needle withdrawn and epidural catheter threaded easily. Negative aspiration of catheter for heme or CSF prior to  starting epidural infusion. Warning signs of high block given to the patient including shortness of breath, tingling/numbness in hands, complete motor block, or any concerning symptoms with instructions to call for help. Patient was given instructions on fall risk and not to get out of bed. All questions and concerns addressed with instructions to call with any issues or inadequate analgesia.  Patient comfortable with contractions prior to my leaving room. Reason for block:procedure for pain

## 2019-08-06 NOTE — Progress Notes (Addendum)
LABOR PROGRESS NOTE  Sharon Hess is a 24 y.o. G3P2002 at [redacted]w[redacted]d  admitted for SOL.  Subjective: Patient is feeling her contractions more regularly with pressure below. Receiving Fentanyl PRN for pain. No other concerns at this time.  Objective: BP 124/72    Pulse 74    Temp 98.1 F (36.7 C) (Oral)    Resp 18    Ht 5\' 1"  (1.549 m)    Wt 113 kg    LMP  (LMP Unknown)    SpO2 99%    BMI 47.09 kg/m  or  Vitals:   08/06/19 1101 08/06/19 1207 08/06/19 1302 08/06/19 1401  BP: (!) 145/83 131/83 (!) 144/86 124/72  Pulse: 77 82 87 74  Resp: 18  18   Temp:      TempSrc:      SpO2:      Weight:      Height:       Dilation: 8 Effacement (%): 90 Cervical Position: Anterior Station: 0 Presentation: Vertex Exam by:: Dr. [email or web address]: Baseline rate 145, moderate varibility, + acels, occasional early decels Toco: Contractions q 2-4 min  Labs: Lab Results  Component Value Date   WBC 8.4 08/06/2019   HGB 13.1 08/06/2019   HCT 40.0 08/06/2019   MCV 86.2 08/06/2019   PLT 208 08/06/2019    Patient Active Problem List   Diagnosis Date Noted   Patient desires vaginal birth after cesarean section (VBAC) 08/06/2019   Language barrier 04/28/2019   Hepatitis B affecting pregnancy in third trimester 02/12/2019   Supervision of high risk pregnancy, antepartum    Obesity affecting pregnancy    History of cesarean section    Right elbow pain 11/29/2017   Low back pain 12/01/2014   Fatty liver disease, nonalcoholic 07/13/2014    Assessment / Plan: 24 y.o. G3P2002 at [redacted]w[redacted]d here for SOL. H/o VBAC x1 after pLTCS 2/2 NRFHT.  Labor:  Unchanged overall from prior exam. Will augment with Pitocin given inadequate change in the last two hours. Plan to recheck in 2 hours or sooner if she begins to feel constant pressure. Fetal Wellbeing:  Cat 2 tracing with early decels Pain Control:  Fentanyl PRN Anticipated MOD:  NSVD  [redacted]w[redacted]d, MD Family Medicine, PGY-3 08/06/2019,  2:57 PM

## 2019-08-06 NOTE — Discharge Summary (Signed)
Postpartum Discharge Summary  Date of Service updated 08/08/19     Patient Name: Sharon Hess DOB: 1995-07-14 MRN: [id number]  Date of admission: 08/06/2019 Delivery date:08/06/2019  Delivering provider: Chauncey Mann  Date of discharge: 08/08/2019  Admitting diagnosis: Patient desires vaginal birth after cesarean section (VBAC) [O34.219] Intrauterine pregnancy: [redacted]w[redacted]d    Secondary diagnosis:  Active Problems:   Obesity affecting pregnancy   History of cesarean section   Hepatitis B affecting pregnancy in third trimester   Language barrier   Patient desires vaginal birth after cesarean section (VBAC)   PPH (postpartum hemorrhage)   Preeclampsia  Additional problems: None    Discharge diagnosis: Term Pregnancy Delivered, Gestational Hypertension and PPH                                              Post partum procedures:Iron infusion Augmentation: AROM and Pitocin Complications: [id number] Hospital course: Onset of Labor With Vaginal Delivery      24y.o. yo [id number] admitted in Latent Labor on 08/06/2019. Patient had an uncomplicated labor course as follows: Initial SVE 4/70/-2. Patient progressed to 6 cm and AROM performed. Progressed to 8 cm and then Pitocin started as no cervical change for several hours. Eventually progressed to complete. 15 second shoulder dystocia at delivery. PPGagefter delivery.  Membrane Rupture Time/Date: 9:16 AM ,08/06/2019   Delivery Method:VBAC, Spontaneous  Episiotomy: None  Lacerations:  None  Patient had postpartum course complicated by PPH. This required placement of a Barki for 24 hours. It was removed without evidence of excessive vaginal bleeding. She did received iron infusion prior to discharge home. She progressed to ambulating, voiding, tolerating diet and good oral pain control. Felt amendable for discharge home. Discharge instructions, medications and follow up were reviewed with pt.Pt verbalized  understanding. Live interrupter was used.  Patient is discharged home in stable condition on 08/08/19.  Newborn Data: Birth date:08/06/2019  Birth time:6:24 PM  Gender:Female  Living status:Living  Apgars:8 ,9  Weight:4766 g   Magnesium Sulfate received: No BMZ received: No Rhophylac:N/A MMR:N/A T-DaP:Given prenatally Flu: No Transfusion:No  Physical exam  Vitals:   08/07/19 1650 08/07/19 1944 08/07/19 1945 08/08/19 0743  BP: 124/76 113/63  115/65  Pulse: (!) 101 88  83  Resp:  18  18  Temp:  98.2 F (36.8 C)  98.2 F (36.8 C)  TempSrc:  Oral  Oral  SpO2:  98% 98% 98%  Weight:      Height:       General: alert Lochia: appropriate Uterine Fundus: firm Incision: Healing well with no significant drainage DVT Evaluation: No evidence of DVT seen on physical exam. Labs: Lab Results  Component Value Date   WBC 7.3 08/08/2019   HGB 8.9 (L) 08/08/2019   HCT 28.3 (L) 08/08/2019   MCV 87.6 08/08/2019   PLT 168 08/08/2019   CMP Latest Ref Rng & Units 05/12/2019  Glucose 65 - 99 mg/dL 82  BUN 6 - 20 mg/dL 6  Creatinine 0.57 - 1.00 mg/dL 0.47(L)  Sodium 134 - 144 mmol/L 137  Potassium 3.5 - 5.2 mmol/L 3.8  Chloride 96 - 106 mmol/L 104  CO2 20 - 29 mmol/L 17(L)  Calcium 8.7 - 10.2 mg/dL 9.2  Total Protein 6.0 - 8.5 g/dL 5.9(L)  Total Bilirubin 0.0 - 1.2 mg/dL 0.7  Alkaline Phos 39 - 117 IU/L 78  AST 0 - 40 IU/L 10  ALT 0 - 32 IU/L 10   Edinburgh Score: Edinburgh Postnatal Depression Scale Screening Tool 08/07/2019  I have been able to laugh and see the funny side of things. 0  I have looked forward with enjoyment to things. 0  I have blamed myself unnecessarily when things went wrong. 0  I have been anxious or worried for no good reason. 0  I have felt scared or panicky for no good reason. 0  Things have been getting on top of me. 0  I have been so unhappy that I have had difficulty sleeping. 0  I have felt sad or miserable. 0  I have been so unhappy that I have  been crying. 0  The thought of harming myself has occurred to me. 0  Edinburgh Postnatal Depression Scale Total 0     After visit meds:  Allergies as of 08/08/2019   No Known Allergies     Medication List    TAKE these medications   ferrous sulfate 325 (65 FE) MG tablet Commonly known as: FerrouSul Take 1 tablet (325 mg total) by mouth 2 (two) times daily.   ibuprofen 600 MG tablet Commonly known as: ADVIL Take 1 tablet (600 mg total) by mouth every 8 (eight) hours as needed for mild pain.   PRENATAL VITAMINS PO Take 1 tablet by mouth daily.        Discharge home in stable condition Infant Feeding: Breast Infant Disposition:home with mother Discharge instruction: per After Visit Summary and Postpartum booklet. Activity: Advance as tolerated. Pelvic rest for 6 weeks.  Diet: routine diet Future Appointments: Future Appointments  Date Time Provider [place]  08/14/2019 10:20 AM [facility] NURSE [facility]  09/04/2019  3:15 PM Caren Macadam, MD [facility]   Follow up Visit:  [place] for [facility] at [facility]. Schedule an appointment as soon as possible for a visit in 4 week(s).   Specialty: Obstetrics and Gynecology Why: Postpartum visit Contact information: [place] [address] [phone]               Please schedule this patient for a In person postpartum visit in 4 weeks with the following provider: Any provider. Additional Postpartum F/U:BP check 1 week  Low risk pregnancy complicated by: HTN Delivery mode:  VBAC, Spontaneous  Anticipated Birth Control:  Depo   08/08/2019 Chancy Milroy, MD

## 2019-08-06 NOTE — Progress Notes (Signed)
Encounter completed with Spanish Interpreter present.  LABOR PROGRESS NOTE  Sharon Hess is a 24 y.o. G3P2002 at [redacted]w[redacted]d  admitted for SOL.   Subjective: Patient doing well. Feeling stronger contractions. FOB at bedside.   Objective: BP 124/73   Pulse (!) 117   Temp 98.3 F (36.8 C) (Oral)   Resp 18   Ht 5\' 1"  (1.549 m)   Wt 113 kg   LMP  (LMP Unknown)   SpO2 99%   BMI 47.09 kg/m  or  Vitals:   08/06/19 0601 08/06/19 0701 08/06/19 0730 08/06/19 0801  BP: 111/72 106/87  124/73  Pulse: 85   (!) 117  Resp: 14 14 18    Temp:   98.3 F (36.8 C)   TempSrc:   Oral   SpO2:      Weight:      Height:        Dilation: 7 Effacement (%): 90 Cervical Position: Anterior Station: -1 Presentation: Vertex Exam by:: Dr. : baseline rate 145, moderate varibility, +acel, np decel Toco: q2-4 min   Labs: Lab Results  Component Value Date   WBC 8.4 08/06/2019   HGB 13.1 08/06/2019   HCT 40.0 08/06/2019   MCV 86.2 08/06/2019   PLT 208 08/06/2019    Patient Active Problem List   Diagnosis Date Noted  . Patient desires vaginal birth after cesarean section (VBAC) 08/06/2019  . Language barrier 04/28/2019  . Hepatitis B affecting pregnancy in third trimester 02/12/2019  . Supervision of high risk pregnancy, antepartum   . Obesity affecting pregnancy   . History of cesarean section   . Right elbow pain 11/29/2017  . Low back pain 12/01/2014  . Fatty liver disease, nonalcoholic 07/13/2014    Assessment / Plan: 24 y.o. G3P2002 at [redacted]w[redacted]d here for SOL. H/o VBAC x1 after pTCS 2/2 NRFHT.   Labor: Continue with expectant management. AROM performed by Dr. 30, PGY-3 without complications and moderate amount of clear fluid noted.  Fetal Wellbeing:  Cat I  Pain Control:  Maternal support.  Anticipated MOD:  NSVD   [redacted]w[redacted]d, D.O. OB Fellow  08/06/2019, 9:19 AM

## 2019-08-06 NOTE — H&P (Addendum)
Encounter completed with Spanish Interpreter present.  OBSTETRIC ADMISSION HISTORY AND PHYSICAL  Sharon Hess is a 24 y.o. female [phone] with IUP at [redacted]w[redacted]d presenting for SOL reporting contractions every 7-10 minutes. She reports +FMs. No LOF, VB, blurry vision, headaches, peripheral edema, or RUQ pain. She plans on breast and bottle feeding. She requests Depo for birth control. She does not desire a circumcision.    Dating: By 14w [place] --->  Estimated Date of Delivery: 07/30/19  Sono:    @[redacted]w[redacted]d , normal anatomy, cephalic presentation, 73%ile, EFW 611g, anterior placenta   Prenatal History/Complications: --Hx of c-section, hx of VBAC x1 --Obesity affecting pregnancy --Nonalcoholic fatty liver disease --Hx of Hep B positive (2015)-negative now --  Past Medical History: Past Medical History:  Diagnosis Date  . Fatty liver   . Hepatitis B   . No pertinent past medical history   . Previous cesarean delivery, antepartum condition or complication 02/16/2014   OP arrest at 7 cm--desires TOLAC--consent signed     Past Surgical History: Past Surgical History:  Procedure Laterality Date  . ABDOMINAL SURGERY     c-section  . CESAREAN SECTION    . CESAREAN SECTION  08/21/2011   Procedure: CESAREAN SECTION;  Surgeon: 08/23/2011, MD;  Location: [facility];  Service: Gynecology;  Laterality: N/A;  Primary cesarean section of baby girl  at 0644  APGAR 9/9    Obstetrical History: OB History     Gravida  3   Para  2   Term  2   Preterm  0   AB  0   Living  2      SAB  0   TAB  0   Ectopic  0   Multiple  0   Live Births  2           Social History: Social History   Socioeconomic History  . Marital status: Significant Other    Spouse name: Not on file  . Number of children: Not on file  . Years of education: Not on file  . Highest education level: Not on file  Occupational History  . Not on file  Tobacco Use  . Smoking status: Never  Smoker  . Smokeless tobacco: Never Used  Vaping Use  . Vaping Use: Never used  Substance and Sexual Activity  . Alcohol use: No  . Drug use: No  . Sexual activity: Yes    Birth control/protection: None  Other Topics Concern  . Not on file  Social History Narrative   ** Merged History Encounter **       Social Determinants of Health   Financial Resource Strain:   . Difficulty of Paying Living Expenses:   Food Insecurity: No Food Insecurity  . Worried About 11/9 in the Last Year: Never true  . Ran Out of Food in the Last Year: Never true  Transportation Needs: No Transportation Needs  . Lack of Transportation (Medical): No  . Lack of Transportation (Non-Medical): No  Physical Activity:   . Days of Exercise per Week:   . Minutes of Exercise per Session:   Stress:   . Feeling of Stress :   Social Connections:   . Frequency of Communication with Friends and Family:   . Frequency of Social Gatherings with Friends and Family:   . Attends Religious Services:   . Active Member of Clubs or Organizations:   . Attends Programme researcher, broadcasting/film/video Meetings:   Banker Marital  Status:     Family History: Family History  Problem Relation Age of Onset  . Alcohol abuse Neg Hx   . Arthritis Neg Hx   . Asthma Neg Hx   . Birth defects Neg Hx   . Cancer Neg Hx   . COPD Neg Hx   . Depression Neg Hx   . Diabetes Neg Hx   . Drug abuse Neg Hx   . Early death Neg Hx   . Hearing loss Neg Hx   . Heart disease Neg Hx   . Hyperlipidemia Neg Hx   . Hypertension Neg Hx   . Kidney disease Neg Hx   . Learning disabilities Neg Hx   . Mental illness Neg Hx   . Mental retardation Neg Hx   . Miscarriages / Stillbirths Neg Hx   . Stroke Neg Hx   . Vision loss Neg Hx     Allergies: No Known Allergies  Medications Prior to Admission  Medication Sig Dispense Refill Last Dose  . Prenatal Vit-Fe Fumarate-FA (PRENATAL VITAMINS PO) Take 1 tablet by mouth daily.   08/05/2019 at Unknown time      Review of Systems:  All systems reviewed and negative except as stated in HPI  PE: Blood pressure 123/73, pulse 84, temperature 98.2 F (36.8 C), temperature source Oral, resp. rate 16, weight 113 kg, SpO2 99 %, currently breastfeeding. General appearance: alert, cooperative, appears stated age and no distress Lungs: regular rate and effort Heart: regular rate  Abdomen: soft, non-tender Extremities: Homans sign is negative, no sign of DVT Presentation: cephalic EFM: 150 bpm, mod-marked variability, presentaccels, no decels Toco: irregular every 3-10 minutes Dilation: 4 Effacement (%): 70 Station: -2 Exam by:: Isabel Caprice, RN  Prenatal labs: ABO, Rh: O/Positive/-- (01/20 1517) Antibody: Negative (01/20 1517) Rubella: 1.36 (01/20 1517) RPR: Non Reactive (04/19 0902)  HBsAg: Negative (01/20 1517)  HIV: Non Reactive (04/19 0902)  GBS: Negative/-- (06/07 1515)  2 hr GTT 83-152-99  Prenatal Transfer Tool  Maternal Diabetes: No Genetic Screening: Declined Maternal Ultrasounds/Referrals: Normal Fetal Ultrasounds or other Referrals:  None, Referred to Materal Fetal Medicine  Maternal Substance Abuse:  No Significant Maternal Medications:  None Significant Maternal Lab Results: Group B Strep negative  No results found for this or any previous visit (from the past 24 hour(s)).  Patient Active Problem List   Diagnosis Date Noted  . Language barrier 04/28/2019  . Hepatitis B affecting pregnancy in third trimester 02/12/2019  . Supervision of high risk pregnancy, antepartum   . Obesity affecting pregnancy   . History of cesarean section   . Right elbow pain 11/29/2017  . Low back pain 12/01/2014  . Fatty liver disease, nonalcoholic 07/13/2014    Assessment/Plan: Sharon Hess is a 24 y.o. G3P2002 at [redacted]w[redacted]d admitted to labor and delivery for SOL.  #Labor: SOL, expectant management. Consider augmentation when/if appropriate. #FWB: Cat 1 #Pain: per patient  request, epidural available if patient desires #I/D: GBS: Negative #MOF: Breast and bottle #MOC: Depo #Circ: Declines #TOLAC: H/o VBAC x1 after pLTCS 2/2 NRFHR #Elevated BP upon arrival to L&D: if persistent, consider Pre-E labs  Alana Lilland, DO  08/06/2019, 3:37 AM   GME ATTESTATION:  I saw and evaluated the patient. I agree with the findings and the plan of care as documented in the resident's note.  Marlowe Alt, DO OB Fellow, Faculty [facility], Center for [facility] 08/06/2019 4:51 AM

## 2019-08-06 NOTE — Plan of Care (Signed)
Ed Mandich, RN 

## 2019-08-07 LAB — CBC
HCT: 31.3 % — ABNORMAL LOW (ref 36.0–46.0)
Hemoglobin: 10.2 g/dL — ABNORMAL LOW (ref 12.0–15.0)
MCH: 28.2 pg (ref 26.0–34.0)
MCHC: 32.6 g/dL (ref 30.0–36.0)
MCV: 86.5 fL (ref 80.0–100.0)
Platelets: 167 10*3/uL (ref 150–400)
RBC: 3.62 MIL/uL — ABNORMAL LOW (ref 3.87–5.11)
RDW: 15.5 % (ref 11.5–15.5)
WBC: 10.9 10*3/uL — ABNORMAL HIGH (ref 4.0–10.5)
nRBC: 0 % (ref 0.0–0.2)

## 2019-08-07 NOTE — Anesthesia Postprocedure Evaluation (Signed)
Anesthesia Post Note  Patient: Sharon Hess [facility]  Procedure(s) Performed: AN AD HOC LABOR EPIDURAL     Patient location during evaluation: Mother Baby Anesthesia Type: Epidural Level of consciousness: awake Pain management: satisfactory to patient Vital Signs Assessment: post-procedure vital signs reviewed and stable Respiratory status: spontaneous breathing Cardiovascular status: stable Anesthetic complications: no  Report via Roseanne Reno RN No complications documented.  Last Vitals:  Vitals:   08/07/19 0950 08/07/19 1157  BP:  (!) 107/53  Pulse:  85  Resp:  20  Temp:  36.7 C  SpO2: 98% 100%    Last Pain:  Vitals:   08/07/19 1157  TempSrc: Oral  PainSc:    Pain Goal:                   [employer]

## 2019-08-07 NOTE — Progress Notes (Signed)
Post Partum Day 1 VBAC, PPH with Bakri placement Subjective: Pt without complaints this morning. Denies dizziness. Tolerating diet. Pain controlled. Min vaginal bleeding  Objective: Blood pressure 103/77, pulse 91, temperature 98.1 F (36.7 C), temperature source Oral, resp. rate 20, height 5\' 1"  (1.549 m), weight 113 kg, SpO2 99 %, unknown if currently breastfeeding.  Physical Exam:  General: alert Lochia: appropriate Uterine Fundus: firm Incision: NA DVT Evaluation: No evidence of DVT seen on physical exam.  Recent Labs    08/06/19 2025 08/07/19 0528  HGB 12.7 10.2*  HCT 39.0 31.3*    Assessment/Plan: Stable. Will begin process of deflating and removing Barki. Continue with progressive care   LOS: 1 day   08/09/19 08/07/2019, 10:19 AM

## 2019-08-07 NOTE — Lactation Note (Signed)
This note was copied from a baby's chart. Lactation Consultation Note  Patient Name: Sharon Hess [id number] Date: 08/07/2019 Reason for consult: Initial assessment;Term  P3 mother whose infant is now 47 hours old.  This is a term baby at 41+0 weeks.  Mother's feeding preference is breast/bottle.  In house Spanish interpreter used for interpretation.  Baby was asleep STS on mother's chest when I arrived.  After reviewing the flowsheet it appeared that baby has only been formula feeding.  Questioned mother as to her preference and feeding goals.  Mother was planning on waiting until she got home to breast feed.  Explained the importance of allowing baby to be put to breast for the stimulation to help obtain a good milk supply.  After discussing the importance of this, mother informed me that she has been latching too.  Asked her to always put him to the breast prior to giving any formula.  Mother verbalized understanding.  She has been feeding large amounts of formula because he is "a big baby."  She uses the gold nipple and he does well with this nipple.  Mother is familiar with feeding cues and hand expression.  Encouraged her to call for latch assistance as needed.  Mom made aware of O/P services, breastfeeding support groups, community resources, and our phone # for post-discharge questions. Brochure provided in [place].    Mother is a [facility] participant in [place].  She may obtain the formula package from [facility] so she is currently unsure if she plans to get a DEBP.  Asked her to think about her options and to call [facility] for follow up.  Mother plans to do this.  Father present.   Maternal Data Formula Feeding for Exclusion: Yes Reason for exclusion: Mother's choice to formula and breast feed on admission Has patient been taught Hand Expression?: Yes  Feeding Feeding Type: Bottle Fed - Formula  LATCH Score                   Interventions    Lactation Tools  Discussed/Used WIC Program: Yes   Consult Status Consult Status: Follow-up Date: 08/08/19 Follow-up type: In-patient    Kerianne Gurr R Mindy Behnken 08/07/2019, 9:23 AM

## 2019-08-07 NOTE — Progress Notes (Signed)
Patient scanned with [employer], all CLEAR. [id number]

## 2019-08-08 DIAGNOSIS — O135 Gestational [pregnancy-induced] hypertension without significant proteinuria, complicating the puerperium: Secondary | ICD-10-CM

## 2019-08-08 DIAGNOSIS — Z98891 History of uterine scar from previous surgery: Secondary | ICD-10-CM

## 2019-08-08 LAB — CBC
HCT: 28.3 % — ABNORMAL LOW (ref 36.0–46.0)
Hemoglobin: 8.9 g/dL — ABNORMAL LOW (ref 12.0–15.0)
MCH: 27.6 pg (ref 26.0–34.0)
MCHC: 31.4 g/dL (ref 30.0–36.0)
MCV: 87.6 fL (ref 80.0–100.0)
Platelets: 168 10*3/uL (ref 150–400)
RBC: 3.23 MIL/uL — ABNORMAL LOW (ref 3.87–5.11)
RDW: 15.6 % — ABNORMAL HIGH (ref 11.5–15.5)
WBC: 7.3 10*3/uL (ref 4.0–10.5)
nRBC: 0 % (ref 0.0–0.2)

## 2019-08-08 MED ORDER — IBUPROFEN 600 MG PO TABS: 600.0000 mg | ORAL_TABLET | Freq: Three times a day (TID) | ORAL | 0 refills | Status: DC | PRN

## 2019-08-08 MED ORDER — SODIUM CHLORIDE 0.9 % IV SOLN
510.0000 mg | Freq: Once | INTRAVENOUS | Status: AC
  Administered 2019-08-08: 510 mg via INTRAVENOUS
  Filled 2019-08-08: qty 17

## 2019-08-08 MED ORDER — FERROUS SULFATE 325 (65 FE) MG PO TABS
325.0000 mg | ORAL_TABLET | Freq: Two times a day (BID) | ORAL | 1 refills | Status: DC
Start: 2019-08-08 — End: 2021-07-07

## 2019-08-08 NOTE — Lactation Note (Signed)
This note was copied from a baby's chart. Lactation Consultation Note  Patient Name: Sharon Hess [id number] Date: 08/08/2019 Reason for consult: Follow-up assessment  P3 mother whose infant is now 47 hours old.  This is a term baby at 41+0 weeks.  Mother's feeding preference is breast/bottle.    In house Spanish interpreter used for interpretation.  Baby was swaddled and asleep in mother's arms when I arrived.  Mother has been breast feeding and supplementing with [employer] Start formula.  Baby has supplemented with large volumes without difficulty using the gold nipple.  Mother stated that one of her breasts is starting to feel heavier today.  She has no nipple/breast complaints.  Engorgement prevention/treatment discussed.  Provided a manual pump and assessed the #24 flange size to be appropriate at this time.  Also gave mother a #27 to take home for future enlargement since the #24 may be too small in the future.  Mother will follow through with contacting WIC. (She may be accepting the formula package)  Mother has our OP phone number for questions/concerns after discharge.  No support person present at this time.   Maternal Data    Feeding    LATCH Score                   Interventions    Lactation Tools Discussed/Used     Consult Status Consult Status: Complete Date: 08/08/19 Follow-up type: Call as needed    Sharon Hess 08/08/2019, 8:34 AM

## 2019-08-08 NOTE — Discharge Instructions (Signed)
Parto vaginal, cuidados de puerperio Postpartum Care After Vaginal Delivery Lea esta informacin sobre cmo cuidarse desde el momento en que nazca su beb y hasta 6 a 12 semanas despus del parto (perodo del posparto). El mdico tambin podr darle instrucciones ms especficas. Comunquese con su mdico si tiene problemas o preguntas. Siga estas indicaciones en su casa: Hemorragia vaginal  Es normal tener un poco de hemorragia vaginal (loquios) despus del parto. Use un apsito sanitario para el sangrado vaginal y secrecin. ? Durante la primera semana despus del parto, la cantidad y el aspecto de los loquios a menudo es similar a las del perodo menstrual. ? Durante las siguientes semanas disminuir gradualmente hasta convertirse en una secrecin seca amarronada o amarillenta. ? En la mayora de las mujeres, los loquios se detienen completamente entre 4 a 6semanas despus del parto. Los sangrados vaginales pueden variar de mujer a mujer.  Cambie los apsitos sanitarios con frecuencia. Observe si hay cambios en el flujo, como: ? Un aumento repentino en el volumen. ? Cambio en el color. ? Cogulos sanguneos grandes.  Si expulsa un cogulo de sangre por la vagina, gurdelo y llame al mdico para informrselo. No deseche los cogulos de sangre por el inodoro antes de hablar con su mdico.  No use tampones ni se haga duchas vaginales hasta que el mdico la autorice.  Si no est amamantando, volver a tener su perodo entre 6 y 8 semanas despus del parto. Si solamente alimenta al beb con leche materna (lactancia materna exclusiva), podra no volver a tener su perodo hasta que deje de amamantar. Cuidados perineales  Mantenga la zona entre la vagina y el ano (perineo) limpia y seca, como se lo haya indicado el mdico. Utilice apsitos o aerosoles analgsicos y cremas, como se lo hayan indicado.  Si le hicieron un corte en el perineo (episiotoma) o tuvo un desgarro en la vagina, controle la  zona para detectar signos de infeccin hasta que sane. Est atenta a los siguientes signos: ? Aumento del enrojecimiento, la hinchazn o el dolor. ? Presenta lquido o sangre que supura del corte o desgarro. ? Calor. ? Pus o mal olor.  Es posible que le den una botella rociadora para que use en lugar de limpiarse el rea con papel higinico despus de usar el bao. Cuando comience a sanar, podr usar la botella rociadora antes de secarse. Asegrese de secarse suavemente.  Para aliviar el dolor causado por una episiotoma, un desgarro en la vagina o venas hinchadas en el ano (hemorroides), trate de tomar un bao de asiento tibio 2 o 3 veces por da. Un bao de asiento es un bao de agua tibia que se toma mientras se est sentado. El agua solo debe llegar hasta las caderas y cubrir las nalgas. Cuidado de las mamas  En los primeros das despus del parto, las mamas pueden sentirse pesadas, llenas e incmodas (congestin mamaria). Tambin puede escaparse leche de sus senos. El mdico puede sugerirle mtodos para aliviar este malestar. La congestin mamaria debera desaparecer al cabo de unos das.  Si est amamantando: ? Use un sostn que sujete y ajuste bien sus pechos. ? Mantenga los pezones secos y limpios. Aplquese cremas y ungentos, como se lo haya indicado el mdico. ? Es posible que deba usar discos de algodn en el sostn para absorber la leche que se filtre de sus senos. ? Puede tener contracciones uterinas cada vez que amamante durante varias semanas despus del parto. Las contracciones uterinas ayudan al tero a   regresar a su tamao habitual. ? Si tiene algn problema con la lactancia materna, colabore con el mdico o un asesor en lactancia.  Si no est amamantando: ? Evite tocarse mucho las mamas. Al hacerlo, podran producir ms leche. ? Use un sostn que le proporcione el ajuste correcto y compresas fras para reducir la hinchazn. ? No extraiga (saque) leche materna. Esto har que  produzca ms leche. Intimidad y sexualidad  Pregntele al mdico cundo puede retomar la actividad sexual. Esto puede depender de lo siguiente: ? Su riesgo de sufrir infecciones. ? La rapidez con la que est sanando. ? Su comodidad y deseo de retomar la actividad sexual.  Despus del parto, puede quedar embarazada incluso si no ha tenido todava su perodo. Si lo desea, hable con el mdico acerca de los mtodos de control de la natalidad (mtodos anticonceptivos). Medicamentos  Tome los medicamentos de venta libre y los recetados solamente como se lo haya indicado el mdico.  Si le recetaron un antibitico, tmelo como se lo haya indicado el mdico. No deje de tomar el antibitico aunque comience a sentirse mejor. Actividad  Retome sus actividades normales de a poco como se lo haya indicado el mdico. Pregntele al mdico qu actividades son seguras para usted.  Descanse todo lo que pueda. Trate de descansar o tomar una siesta mientras el beb duerme. Comida y bebida   Beba suficiente lquido como para mantener la orina de color amarillo plido.  Coma alimentos ricos en fibras todos los das. Estos pueden ayudarla a prevenir o aliviar el estreimiento. Los alimentos ricos en fibras incluyen, entre otros: ? Panes y cereales integrales. ? Arroz integral. ? Frijoles. ? Frutas y verduras frescas.  No intente perder de peso rpidamente reduciendo el consumo de caloras.  Tome sus vitaminas prenatales hasta la visita de seguimiento de posparto o hasta que su mdico le indique que puede dejar de tomarlas. Estilo de vida  No consuma ningn producto que contenga nicotina o tabaco, como cigarrillos y cigarrillos electrnicos. Si necesita ayuda para dejar de fumar, consulte al mdico.  No beba alcohol, especialmente si est amamantando. Instrucciones generales  Concurra a todas las visitas de seguimiento para usted y el beb, como se lo haya indicado el mdico. La mayora de las mujeres  visita al mdico para un seguimiento de posparto dentro de las primeras 3 a 6 semanas despus del parto. Comunquese con un mdico si:  Se siente incapaz de controlar los cambios que implica tener un hijo y esos sentimientos no desaparecen.  Siente tristeza o preocupacin de forma inusual.  Las mamas se ponen rojas, le duelen o se endurecen.  Tiene fiebre.  Tiene dificultad para retener la orina o para impedir que la orina se escape.  Tiene poco inters o falta de inters en actividades que solan gustarle.  No ha amamantado nada y no ha tenido un perodo menstrual durante 12 semanas despus del parto.  Dej de amamantar al beb y no ha tenido su perodo menstrual durante 12 semanas despus de dejar de amamantar.  Tiene preguntas sobre su cuidado y el del beb.  Elimina un cogulo de sangre grande por la vagina. Solicite ayuda de inmediato si:  Siente dolor en el pecho.  Tiene dificultad para respirar.  Tiene un dolor repentino e intenso en la pierna.  Tiene dolor intenso o clicos en el la parte inferior del abdomen.  Tiene una hemorragia tan intensa de la vagina que empapa ms de un apsito en una hora. El sangrado   no debe ser ms abundante que el perodo ms intenso que haya tenido.  Dolor de cabeza intenso.  Se desmaya.  Tiene visin borrosa o manchas en la vista.  Tiene secrecin vaginal con mal olor.  Tiene pensamientos acerca de lastimarse a usted misma o a su beb. Si alguna vez siente que puede lastimarse a usted misma o a otras personas, o tiene pensamientos de poner fin a su vida, busque ayuda de inmediato. Puede dirigirse al departamento de emergencias ms cercano o llamar a:  El servicio de emergencias de su localidad (911 en [place]  Una lnea de asistencia al suicida y atencin en crisis, como la Lnea Nacional de Prevencin del Suicidio (National Suicide Prevention Lifeline), al 1-800-273-8255. Est disponible las 24 horas del da. Resumen  El  perodo de tiempo justo despus el parto y hasta 6 a 12 semanas despus del parto se denomina perodo posparto.  Retome sus actividades normales de a poco como se lo haya indicado el mdico.  Concurra a todas las visitas de seguimiento para usted y el beb, como se lo haya indicado el mdico. Esta informacin no tiene como fin reemplazar el consejo del mdico. Asegrese de hacerle al mdico cualquier pregunta que tenga. Document Revised: 04/21/2017 Document Reviewed: 12/31/2016 Elsevier Patient Education  2020 Elsevier [employer].  

## 2019-08-10 LAB — BPAM RBC
Blood Product Expiration Date: 202108162359
Blood Product Expiration Date: 202108162359
Unit Type and Rh: 5100
Unit Type and Rh: 5100

## 2019-08-10 LAB — TYPE AND SCREEN
ABO/RH(D): O POS
Antibody Screen: NEGATIVE
Unit division: 0
Unit division: 0

## 2019-08-14 ENCOUNTER — Ambulatory Visit (INDEPENDENT_AMBULATORY_CARE_PROVIDER_SITE_OTHER): Payer: Self-pay

## 2019-08-14 ENCOUNTER — Other Ambulatory Visit: Payer: Self-pay

## 2019-08-14 VITALS — BP 122/76 | HR 98 | Ht 61.0 in | Wt 225.2 lb

## 2019-08-14 DIAGNOSIS — Z0130 Encounter for examination of blood pressure without abnormal findings: Secondary | ICD-10-CM

## 2019-08-14 NOTE — Progress Notes (Signed)
Pt here today for BP check s/p vaginal delivery with dx preeclampsia.  With Spanish Interpreter Eda R., pt denies headache or visual disturbances.  BP LA 122/76.  Pt advised to continue to monitor for sx's elevated BP, take BP at home if have sx's, and call the office with BP readings 140/90 or greater.  I informed pt that we will f/u with her at her appt on 8/12 @ 1515.  Pt verbalized understanding.   Addison Naegeli, RN  08/14/19

## 2019-08-14 NOTE — Progress Notes (Signed)
Patient was assessed and managed by nursing staff during this encounter. I have reviewed the chart and agree with the documentation and plan. I have also made any necessary editorial changes.  Warden Fillers, MD 08/14/2019 1:42 PM

## 2019-09-04 ENCOUNTER — Ambulatory Visit (INDEPENDENT_AMBULATORY_CARE_PROVIDER_SITE_OTHER): Payer: Self-pay | Admitting: Family Medicine

## 2019-09-04 ENCOUNTER — Encounter: Payer: Self-pay | Admitting: Family Medicine

## 2019-09-04 ENCOUNTER — Other Ambulatory Visit: Payer: Self-pay

## 2019-09-04 DIAGNOSIS — R300 Dysuria: Secondary | ICD-10-CM

## 2019-09-04 LAB — POCT URINALYSIS DIP (DEVICE)
Bilirubin Urine: NEGATIVE
Glucose, UA: NEGATIVE mg/dL
Ketones, ur: NEGATIVE mg/dL
Nitrite: NEGATIVE
Protein, ur: NEGATIVE mg/dL
Specific Gravity, Urine: 1.025 (ref 1.005–1.030)
Urobilinogen, UA: 0.2 mg/dL (ref 0.0–1.0)
pH: 5.5 (ref 5.0–8.0)

## 2019-09-04 NOTE — Progress Notes (Signed)
Post Partum Visit Note  Sharon Hess is a 24 y.o. [id number] female who presents for a postpartum visit. She is 4 weeks postpartum following a normal spontaneous vaginal delivery.  I have fully reviewed the prenatal and intrapartum course. The delivery was at 41 gestational weeks.  Anesthesia: epidural. Postpartum course has been uncomplicated. Baby is doing well uncomplicated. Baby is feeding by both breast and bottle - Gerber. Bleeding staining only. Bowel function is normal. Bladder function is normal. Patient is not sexually active. Contraception method is none. Patient interested in Depo. Postpartum depression screening: negative. Patient with no concerns about vaginal healing.    The pregnancy intention screening data noted above was reviewed. Potential methods of contraception were discussed. The patient elected to proceed with Hormonal Injection.      The following portions of the patient's history were reviewed and updated as appropriate: allergies, current medications, past family history, past medical history, past social history, past surgical history and problem list.  Review of Systems Pertinent items are noted in HPI.    Objective:  unknown if currently breastfeeding.  General:  alert, cooperative and appears stated age   Breasts:  not indicated  Lungs: Normal WOB  Heart:  RR  Abdomen: Soft NT   Vulva:  not evaluated  Vagina: not evaluated  Cervix:  not evaluated  Corpus: not examined  Adnexa:  not evaluated  Rectal Exam: Not performed.        Assessment:    Normal postpartum exam. Pap smear not done at today's visit.   Plan:   Essential components of care per ACOG recommendations:  1.  Mood and well being: Patient with negative depression screening today. Reviewed local resources for support.  - Patient does not use tobacco.  - hx of drug use? No    2. Infant care and feeding:  -Patient currently breastmilk feeding? Yes If breastmilk feeding  discussed return to work and pumping. If needed, patient was provided letter for work to allow for every 2-3 hr pumping breaks, and to be granted a private location to express breastmilk and refrigerated area to store breastmilk. Reviewed importance of draining breast regularly to support lactation. -Social determinants of health (SDOH) reviewed in EPIC. No concerns  3. Sexuality, contraception and birth spacing - Patient does not want a pregnancy in the next year.  Desired family size is 4 children.  - Reviewed forms of contraception in tiered fashion. Patient desired Depo-Provera -received in hospital on 7/16--> counseled on needing next dose 11-13wks with likely protection up to 15 weeks.  Needs depo in mid October.  - Reviewed cost of depo at our clinic and use of [facility] for future doses.  - Discussed bridge with pills if desired-- patient declined today but will call if she needs OCP to get her to her next depo injection pending the [facility] availability  - Discussed birth spacing of 18 months  4. Sleep and fatigue -Encouraged family/partner/community support of 4 hrs of uninterrupted sleep to help with mood and fatigue  5. Physical Recovery  - Discussed patients delivery and complications- had PPH and on Fe - Patient had a 2 degree laceration, perineal healing reviewed. Patient expressed understanding - Patient has urinary incontinence? No  - Patient is safe to resume physical and sexual activity  6.  Health Maintenance -Pap UTD  7.Chronic Disease - Skin change- had acanthosis nigrans on neck-- recommend monitoring and PCP follow up - PPH- with anemia. HGB today - Preclampsia- BP  WNL today - Dysuria- UCx today  Marylynn Pearson, RN [facility] [employer], [facility]

## 2019-09-05 LAB — CBC
Hematocrit: 40.3 % (ref 34.0–46.6)
Hemoglobin: 13.1 g/dL (ref 11.1–15.9)
MCH: 28.3 pg (ref 26.6–33.0)
MCHC: 32.5 g/dL (ref 31.5–35.7)
MCV: 87 fL (ref 79–97)
Platelets: 324 10*3/uL (ref 150–450)
RBC: 4.63 x10E6/uL (ref 3.77–5.28)
RDW: 15.2 % (ref 11.7–15.4)
WBC: 8.5 10*3/uL (ref 3.4–10.8)

## 2019-09-06 LAB — URINE CULTURE: Organism ID, Bacteria: NO GROWTH

## 2019-09-08 ENCOUNTER — Telehealth: Payer: Self-pay

## 2019-09-08 NOTE — Telephone Encounter (Addendum)
-----   Message from Federico Flake, MD sent at 09/05/2019  1:02 PM EDT ----- Improved hgb. Patient can stop Fe therapy.   Federico Flake, MD  P [facility] Pool Urine culture negative. Patient had complained of dysuria. Likely not related to infection given this result. If continued recommend she return for care.

## 2019-09-08 NOTE — Telephone Encounter (Signed)
Called pt with [facility] Interpreter # [phone] and informed pt that because her Hgb levels have improved so she can stop taking the iron tablet.  I also informed her that her urine culture is also negative.  Pt verbalized understanding with no further questions.   Addison Naegeli, RN  09/08/19

## 2019-09-26 ENCOUNTER — Encounter: Payer: Self-pay | Admitting: General Practice

## 2019-10-08 ENCOUNTER — Ambulatory Visit: Payer: Self-pay

## 2019-12-25 ENCOUNTER — Ambulatory Visit: Payer: Self-pay | Admitting: Family Medicine

## 2020-12-22 ENCOUNTER — Emergency Department (HOSPITAL_COMMUNITY)
Admission: EM | Admit: 2020-12-22 | Discharge: 2020-12-23 | Disposition: A | Discharge status: Home / Self Care | Payer: Self-pay | Attending: Emergency Medicine | Admitting: Emergency Medicine

## 2020-12-22 ENCOUNTER — Other Ambulatory Visit: Payer: Self-pay

## 2020-12-22 DIAGNOSIS — R519 Headache, unspecified: Secondary | ICD-10-CM | POA: Insufficient documentation

## 2020-12-22 DIAGNOSIS — Z5321 Procedure and treatment not carried out due to patient leaving prior to being seen by health care provider: Secondary | ICD-10-CM | POA: Insufficient documentation

## 2020-12-22 DIAGNOSIS — R1030 Lower abdominal pain, unspecified: Secondary | ICD-10-CM | POA: Insufficient documentation

## 2020-12-22 DIAGNOSIS — M549 Dorsalgia, unspecified: Secondary | ICD-10-CM | POA: Insufficient documentation

## 2020-12-22 NOTE — ED Triage Notes (Signed)
Pt here from home for HA, lower back pain, lower abd pain w/ pins and needles feeling. Pt denies fever, N/V/D.

## 2020-12-22 NOTE — ED Provider Notes (Signed)
Emergency Medicine Provider Triage Evaluation Note  Sharon Hess , a 25 y.o. female  was evaluated in triage.  Pt complains of HA, back pain, lower abd pain, and pins and needles feelings for 3 wks. Pt states sxs are constant. Has been worsening, which prompted her visit today. She has tried tylenol. No cough, n/v, urinary sxs, or abnormal BMs. LMP >1 yr ago, has nexplanon  Review of Systems  Positive: HA, back pain, pins and needles feeling, lower abd pain Negative: fever  Physical Exam  BP (!) 144/88   Pulse 90   Temp 98.8 F (37.1 C) (Oral)   Resp 16   SpO2 100%  Gen:   Awake, no distress   Resp:  Normal effort  MSK:   Moves extremities without difficulty  Other:  Ttp of lower abd, mostly over suprapubic abd  Medical Decision Making  Medically screening exam initiated at 11:37 PM.  Appropriate orders placed.  [facility] was informed that the remainder of the evaluation will be completed by another provider, this initial triage assessment does not replace that evaluation, and the importance of remaining in the ED until their evaluation is complete.  Labs, UA   Alveria Apley, PA-C 12/22/20 2340    Tilden Fossa, MD 12/23/20 [phone]

## 2020-12-23 ENCOUNTER — Encounter (HOSPITAL_COMMUNITY): Payer: Self-pay | Admitting: Emergency Medicine

## 2020-12-23 LAB — TSH: TSH: 1.602 u[IU]/mL (ref 0.350–4.500)

## 2020-12-23 LAB — COMPREHENSIVE METABOLIC PANEL
ALT: 23 U/L (ref 0–44)
AST: 14 U/L — ABNORMAL LOW (ref 15–41)
Albumin: 4.3 g/dL (ref 3.5–5.0)
Alkaline Phosphatase: 72 U/L (ref 38–126)
Anion gap: 9 (ref 5–15)
BUN: 13 mg/dL (ref 6–20)
CO2: 24 mmol/L (ref 22–32)
Calcium: 9.8 mg/dL (ref 8.9–10.3)
Chloride: 107 mmol/L (ref 98–111)
Creatinine, Ser: 0.6 mg/dL (ref 0.44–1.00)
GFR, Estimated: >60 mL/min (ref >60)
Glucose, Bld: 104 mg/dL — ABNORMAL HIGH (ref 70–99)
Potassium: 3.8 mmol/L (ref 3.5–5.1)
Sodium: 140 mmol/L (ref 135–145)
Total Bilirubin: 1.2 mg/dL (ref 0.3–1.2)
Total Protein: 7.7 g/dL (ref 6.5–8.1)

## 2020-12-23 LAB — CBC WITH DIFFERENTIAL/PLATELET
Abs Immature Granulocytes: 0.03 10*3/uL (ref 0.00–0.07)
Basophils Absolute: 0 10*3/uL (ref 0.0–0.1)
Basophils Relative: 0 %
Eosinophils Absolute: 0.1 10*3/uL (ref 0.0–0.5)
Eosinophils Relative: 2 %
HCT: 42.2 % (ref 36.0–46.0)
Hemoglobin: 14 g/dL (ref 12.0–15.0)
Immature Granulocytes: 0 %
Lymphocytes Relative: 36 %
Lymphs Abs: 3.1 10*3/uL (ref 0.7–4.0)
MCH: 29.9 pg (ref 26.0–34.0)
MCHC: 33.2 g/dL (ref 30.0–36.0)
MCV: 90 fL (ref 80.0–100.0)
Monocytes Absolute: 0.4 10*3/uL (ref 0.1–1.0)
Monocytes Relative: 5 %
Neutro Abs: 4.8 10*3/uL (ref 1.7–7.7)
Neutrophils Relative %: 57 %
Platelets: 289 10*3/uL (ref 150–400)
RBC: 4.69 MIL/uL (ref 3.87–5.11)
RDW: 12.7 % (ref 11.5–15.5)
WBC: 8.4 10*3/uL (ref 4.0–10.5)
nRBC: 0 % (ref 0.0–0.2)

## 2020-12-23 LAB — URINALYSIS, ROUTINE W REFLEX MICROSCOPIC
Bilirubin Urine: NEGATIVE
Glucose, UA: NEGATIVE mg/dL
Ketones, ur: NEGATIVE mg/dL
Leukocytes,Ua: NEGATIVE
Nitrite: NEGATIVE
Protein, ur: NEGATIVE mg/dL
Specific Gravity, Urine: 1.02 (ref 1.005–1.030)
pH: 6 (ref 5.0–8.0)

## 2020-12-23 LAB — URINALYSIS, MICROSCOPIC (REFLEX)

## 2020-12-23 LAB — I-STAT BETA HCG BLOOD, ED (MC, WL, AP ONLY): I-stat hCG, quantitative: <5 m[IU]/mL (ref <5)

## 2020-12-23 NOTE — ED Notes (Signed)
Patient left without being seen due to wait times  °

## 2021-03-14 NOTE — Progress Notes (Signed)
New Patient Office Visit  Subjective:  Patient ID: Sharon Hess, female    DOB: 1995-09-15  Age: 26 y.o. MRN: [id number]  CC:  Chief Complaint  Patient presents with   New Patient (Initial Visit)    [facility] for primary care to establish.  This visit was assisted by in person Spanish interpreter Donata ClayKarina This patient is establishing here for primary care and has a history of weight gain since her last pregnancy she does have 3 children her last baby was born in July 2021.  Patient did have preeclampsia in the past and has had C-sections previously as well.  Her last Pap smear was in 2019 and she is now due repeat Pap smear.  She does have a Nexplanon birth control device implanted in her left upper arm and [facility].  She declines to receive the HPV vaccine.  She does have a single partner and has 3 children with this partner is not married.  She states she does not wish to have further children which is why she has a Nexplanon in place.  She does complain of some dysuria at this time and numbness in the hands and feet.  She was in the emergency room in November close to Thanksgiving had a normal TSH normal metabolic panel and blood counts.  She would like her lipids checked.  She does complain of shortness of breath and chest tightness.  She complains of rash acneiform over the anterior posterior chest and face.  On arrival today blood sugars 101 and blood pressure is 123/75 weight is elevated at  243#  The patient did agree to and received a flu vaccine at this visit she had a tetanus shot in 2021 The patient is unvaccinated for COVID and declines to receive this Past Medical History:  Diagnosis Date   Anemia    Fatty liver    Hepatitis B    Preeclampsia 2021   Previous cesarean delivery, antepartum condition or complication 02/16/2014   OP arrest at 7 cm--desires  TOLAC--consent signed     Past Surgical History:  Procedure Laterality Date   ABDOMINAL SURGERY     c-section   CESAREAN SECTION     CESAREAN SECTION  08/21/2011   Procedure: CESAREAN SECTION;  Surgeon: Allie BossierMyra C Dove, MD;  Location: [facility];  Service: Gynecology;  Laterality: N/A;  Primary cesarean section of baby girl  at [id number]  APGAR 9/9    Family History  Problem Relation Age of Onset   Alcohol abuse Neg Hx    Arthritis Neg Hx    Asthma Neg Hx    Birth defects Neg Hx    Cancer Neg Hx    COPD Neg Hx    Depression Neg Hx    Diabetes Neg Hx    Drug abuse Neg Hx    Early death Neg Hx    Hearing loss Neg Hx    Heart disease Neg Hx    Hyperlipidemia Neg Hx    Hypertension Neg Hx    Kidney disease Neg Hx    Learning disabilities Neg Hx    Mental illness Neg Hx    Mental retardation Neg Hx    Miscarriages / Stillbirths Neg Hx    Stroke Neg Hx    Vision loss Neg Hx     Social History   Socioeconomic History   Marital status: Significant  Other    Spouse name: Not on file   Number of children: Not on file   Years of education: Not on file   Highest education level: Not on file  Occupational History   Not on file  Tobacco Use   Smoking status: Never   Smokeless tobacco: Never  Vaping Use   Vaping Use: Never used  Substance and Sexual Activity   Alcohol use: No   Drug use: No   Sexual activity: Yes    Birth control/protection: None  Other Topics Concern   Not on file  Social History Narrative   ** Merged History Encounter **       Social Determinants of Health   Financial Resource Strain: Not on file  Food Insecurity: Not on file  Transportation Needs: Not on file  Physical Activity: Not on file  Stress: Not on file  Social Connections: Not on file  Intimate Partner Violence: Not on file    ROS Review of Systems  Constitutional: Negative.   HENT: Negative.  Negative for ear pain, postnasal drip, rhinorrhea, sinus pressure, sore throat, trouble swallowing  and voice change.   Eyes: Negative.   Respiratory: Negative.  Negative for apnea, cough, choking, chest tightness, shortness of breath, wheezing and stridor.   Cardiovascular: Negative.  Negative for chest pain, palpitations and leg swelling.  Gastrointestinal: Negative.  Negative for abdominal distention, abdominal pain, nausea and vomiting.  Genitourinary:  Positive for dysuria.  Musculoskeletal: Negative.  Negative for arthralgias and myalgias.  Skin:  Positive for rash.  Allergic/Immunologic: Negative.  Negative for environmental allergies and food allergies.  Neurological:  Positive for numbness. Negative for dizziness, syncope, weakness and headaches.  Hematological: Negative.  Negative for adenopathy. Does not bruise/bleed easily.  Psychiatric/Behavioral: Negative.  Negative for agitation and sleep disturbance. The patient is not nervous/anxious.    Objective:   Today's Vitals: BP 123/75    Pulse 81    Resp 16    Wt 243 lb (110.2 kg)    SpO2 96%    BMI 45.91 kg/m   Physical Exam Vitals reviewed.  Constitutional:      Appearance: Normal appearance. She is well-developed. She is obese. She is not diaphoretic.  HENT:     Head: Normocephalic and atraumatic.     Nose: No nasal deformity, septal deviation, mucosal edema or rhinorrhea.     Right Sinus: No maxillary sinus tenderness or frontal sinus tenderness.     Left Sinus: No maxillary sinus tenderness or frontal sinus tenderness.     Mouth/Throat:     Mouth: Mucous membranes are moist.     Pharynx: Oropharynx is clear. No oropharyngeal exudate.  Eyes:     General: No scleral icterus.    Conjunctiva/sclera: Conjunctivae normal.     Pupils: Pupils are equal, round, and reactive to light.  Neck:     Thyroid: No thyromegaly.     Vascular: No carotid bruit or JVD.     Trachea: Trachea normal. No tracheal tenderness or tracheal deviation.  Cardiovascular:     Rate and Rhythm: Normal rate and regular rhythm.     Chest Wall: PMI  is not displaced.     Pulses: Normal pulses. No decreased pulses.     Heart sounds: Normal heart sounds, S1 normal and S2 normal. Heart sounds not distant. No murmur heard. No systolic murmur is present.  No diastolic murmur is present.    No friction rub. No gallop. No S3 or S4 sounds.  Pulmonary:  Effort: Pulmonary effort is normal. No tachypnea, accessory muscle usage or respiratory distress.     Breath sounds: Normal breath sounds. No stridor. No decreased breath sounds, wheezing, rhonchi or rales.  Chest:     Chest wall: No tenderness.  Abdominal:     General: Bowel sounds are normal. There is no distension.     Palpations: Abdomen is soft. Abdomen is not rigid.     Tenderness: There is no abdominal tenderness. There is no guarding or rebound.  Musculoskeletal:        General: Normal range of motion.     Cervical back: Normal range of motion and neck supple. No edema, erythema or rigidity. No muscular tenderness. Normal range of motion.  Lymphadenopathy:     Head:     Right side of head: No submental or submandibular adenopathy.     Left side of head: No submental or submandibular adenopathy.     Cervical: No cervical adenopathy.  Skin:    General: Skin is warm and dry.     Coloration: Skin is not pale.     Findings: Rash present.     Nails: There is no clubbing.     Comments: Diffuse acneiform rash is seen  Neurological:     Mental Status: She is alert and oriented to person, place, and time.     Sensory: No sensory deficit.  Psychiatric:        Speech: Speech normal.        Behavior: Behavior normal.    Assessment & Plan:   Problem List Items Addressed This Visit       Digestive   Fatty liver disease, nonalcoholic    Liver function in November was normal and will not recheck this we will try to work on a weight loss program for this patient        Musculoskeletal and Integument   Acne    Diffuse acne seen on the anterior posterior chest and face  Plan to  begin BenzaClin cream as needed to affected areas      Relevant Medications   clindamycin-benzoyl peroxide (BENZACLIN) gel     Other   Dysuria    We will reassess with urinalysis      Relevant Orders   Urinalysis   Morbid obesity with BMI of 40.0-44.9, adult ([place]) - Primary    Morbid obesity need to screen for diabetes with hemoglobin A1c check lipid panel  I spent 10 minutes going over a food as medicine diet with this patient and gave her a handout as well explaining that half her plate needs to be the colors of the rainbow.  Also gave her an exercise program of walking 30 minutes 4-5 times a week      Cervical cancer screening    Patient needs cervical cancer screening will refer to one of the providers in the clinic for a Pap smear      Encounter for health-related screening    Check lipid panel      Relevant Orders   Lipid panel   Numbness and tingling in both hands   Relevant Orders   Comprehensive metabolic panel   Hemoglobin A1c   Vitamin B1   VITAMIN D 25 Hydroxy (Vit-D Deficiency, Fractures)   Folate   Other Visit Diagnoses     Screening for diabetes mellitus       Relevant Orders   POCT glucose (manual entry) (Completed)   Hemoglobin A1c   Need for immunization against influenza  Relevant Orders   Flu Vaccine QUAD 41mo+IM (Fluarix, Fluzone & Alfiuria Quad PF) (Completed)       Outpatient Encounter Medications as of 03/15/2021  Medication Sig   clindamycin-benzoyl peroxide (BENZACLIN) gel Apply topically 2 (two) times daily.   ferrous sulfate (FERROUSUL) 325 (65 FE) MG tablet Take 1 tablet (325 mg total) by mouth 2 (two) times daily. (Patient not taking: Reported on 03/15/2021)   ibuprofen (ADVIL) 600 MG tablet Take 1 tablet (600 mg total) by mouth every 8 (eight) hours as needed for mild pain. (Patient not taking: Reported on 03/15/2021)   Prenatal Vit-Fe Fumarate-FA (PRENATAL VITAMINS PO) Take 1 tablet by mouth daily. (Patient not taking: Reported  on 03/15/2021)   No facility-administered encounter medications on file as of 03/15/2021.    Follow-up: Return in about 4 months (around 07/13/2021).   Asencion Noble, MD

## 2021-03-15 ENCOUNTER — Other Ambulatory Visit: Payer: Self-pay

## 2021-03-15 ENCOUNTER — Ambulatory Visit: Payer: Self-pay | Attending: Critical Care Medicine | Admitting: Critical Care Medicine

## 2021-03-15 ENCOUNTER — Encounter: Payer: Self-pay | Admitting: Critical Care Medicine

## 2021-03-15 VITALS — BP 123/75 | HR 81 | Resp 16 | Wt 243.0 lb

## 2021-03-15 DIAGNOSIS — L700 Acne vulgaris: Secondary | ICD-10-CM

## 2021-03-15 DIAGNOSIS — K760 Fatty (change of) liver, not elsewhere classified: Secondary | ICD-10-CM

## 2021-03-15 DIAGNOSIS — Z6841 Body Mass Index (BMI) 40.0 and over, adult: Secondary | ICD-10-CM | POA: Insufficient documentation

## 2021-03-15 DIAGNOSIS — R300 Dysuria: Secondary | ICD-10-CM

## 2021-03-15 DIAGNOSIS — L709 Acne, unspecified: Secondary | ICD-10-CM | POA: Insufficient documentation

## 2021-03-15 DIAGNOSIS — Z124 Encounter for screening for malignant neoplasm of cervix: Secondary | ICD-10-CM | POA: Insufficient documentation

## 2021-03-15 DIAGNOSIS — Z139 Encounter for screening, unspecified: Secondary | ICD-10-CM

## 2021-03-15 DIAGNOSIS — Z131 Encounter for screening for diabetes mellitus: Secondary | ICD-10-CM

## 2021-03-15 DIAGNOSIS — R202 Paresthesia of skin: Secondary | ICD-10-CM

## 2021-03-15 DIAGNOSIS — Z23 Encounter for immunization: Secondary | ICD-10-CM

## 2021-03-15 DIAGNOSIS — R200 Anesthesia of skin: Secondary | ICD-10-CM

## 2021-03-15 LAB — GLUCOSE, POCT (MANUAL RESULT ENTRY): POC Glucose: 101 mg/dl — AB (ref 70–99)

## 2021-03-15 MED ORDER — CLINDAMYCIN PHOS-BENZOYL PEROX 1-5 % EX GEL
Freq: Two times a day (BID) | CUTANEOUS | 0 refills | Status: DC
  Filled 2021-03-15: qty 25, 30d supply, fill #0
  Filled 2021-04-07: qty 25, 10d supply, fill #0

## 2021-03-15 NOTE — Assessment & Plan Note (Signed)
Check lipid panel  

## 2021-03-15 NOTE — Assessment & Plan Note (Signed)
Patient needs cervical cancer screening will refer to one of the providers in the clinic for a Pap smear

## 2021-03-15 NOTE — Assessment & Plan Note (Signed)
Liver function in November was normal and will not recheck this we will try to work on a weight loss program for this patient

## 2021-03-15 NOTE — Progress Notes (Signed)
Pt states that she has dark spots around neck, inner thighs, and armpit. States of having numbness in arms and feeling faint.

## 2021-03-15 NOTE — Assessment & Plan Note (Signed)
Morbid obesity need to screen for diabetes with hemoglobin A1c check lipid panel  I spent 10 minutes going over a food as medicine diet with this patient and gave her a handout as well explaining that half her plate needs to be the colors of the rainbow.  Also gave her an exercise program of walking 30 minutes 4-5 times a week

## 2021-03-15 NOTE — Patient Instructions (Addendum)
Follow a healthy diet per our discussion and see attachment  Walk 30 minutes a day continuously 4 to 5 days a week   Begin BenzaClin cream to affected areas in the skin daily as needed  Complete set of screening labs will be obtained including labs to evaluate your tingling sensations, cholesterol be checked  Urinalysis will be obtained  A Pap smear will be scheduled with one of our advanced practice professional providers  No other medications indicated  Return Dr Delford Field 4 months  Siga una dieta saludable segn nuestra discusin y vea el archivo adjunto  Camine 30 minutos al da continuamente 4 a 5 das a la semana  Comience a Government social research officer en las reas afectadas de la piel todos los das segn sea necesario.  Se obtendr un conjunto completo de laboratorios de deteccin, incluidos laboratorios para evaluar sus sensaciones de hormigueo, se controlar el colesterol  Se obtendr un anlisis de orina  Se programar una prueba de Papanicolaou con uno de nuestros proveedores profesionales de prctica avanzada.  No se indican otros medicamentos  Volver Dr. Delford Field 4 meses

## 2021-03-15 NOTE — Assessment & Plan Note (Signed)
Diffuse acne seen on the anterior posterior chest and face  Plan to begin BenzaClin cream as needed to affected areas

## 2021-03-15 NOTE — Assessment & Plan Note (Signed)
We will reassess with urinalysis

## 2021-03-16 ENCOUNTER — Telehealth: Payer: Self-pay

## 2021-03-16 NOTE — Telephone Encounter (Signed)
-----   Message from Storm Frisk, MD sent at 03/16/2021  9:15 AM EST ----- Let pt know :  liver kidney normal, no diabetes, vit D slightly low recommend she take Vit D over the counter supplement daily  UA normal   no infection, cholesterol is normal , vitamin levels normal

## 2021-03-16 NOTE — Telephone Encounter (Signed)
Pt was called and is aware of results, DOB was confirmed.  °Interpreter ID#[id number] °

## 2021-03-18 LAB — URINALYSIS
Bilirubin, UA: NEGATIVE
Glucose, UA: NEGATIVE
Ketones, UA: NEGATIVE
Leukocytes,UA: NEGATIVE
Nitrite, UA: NEGATIVE
Protein,UA: NEGATIVE
Specific Gravity, UA: 1.012 (ref 1.005–1.030)
Urobilinogen, Ur: 0.2 mg/dL (ref 0.2–1.0)
pH, UA: 6 (ref 5.0–7.5)

## 2021-03-18 LAB — LIPID PANEL
Chol/HDL Ratio: 3.7 ratio (ref 0.0–4.4)
Cholesterol, Total: 126 mg/dL (ref 100–199)
HDL: 34 mg/dL — ABNORMAL LOW (ref >39)
LDL Chol Calc (NIH): 72 mg/dL (ref 0–99)
Triglycerides: 105 mg/dL (ref 0–149)
VLDL Cholesterol Cal: 20 mg/dL (ref 5–40)

## 2021-03-18 LAB — COMPREHENSIVE METABOLIC PANEL
ALT: 30 IU/L (ref 0–32)
AST: 17 IU/L (ref 0–40)
Albumin/Globulin Ratio: 2.5 — ABNORMAL HIGH (ref 1.2–2.2)
Albumin: 4.9 g/dL (ref 3.9–5.0)
Alkaline Phosphatase: 70 IU/L (ref 44–121)
BUN/Creatinine Ratio: 13 (ref 9–23)
BUN: 8 mg/dL (ref 6–20)
Bilirubin Total: 0.9 mg/dL (ref 0.0–1.2)
CO2: 22 mmol/L (ref 20–29)
Calcium: 9.1 mg/dL (ref 8.7–10.2)
Chloride: 104 mmol/L (ref 96–106)
Creatinine, Ser: 0.62 mg/dL (ref 0.57–1.00)
Globulin, Total: 2 g/dL (ref 1.5–4.5)
Glucose: 97 mg/dL (ref 70–99)
Potassium: 4.4 mmol/L (ref 3.5–5.2)
Sodium: 138 mmol/L (ref 134–144)
Total Protein: 6.9 g/dL (ref 6.0–8.5)
eGFR: 127 mL/min/{1.73_m2} (ref >59)

## 2021-03-18 LAB — HEMOGLOBIN A1C
Est. average glucose Bld gHb Est-mCnc: 117 mg/dL
Hgb A1c MFr Bld: 5.7 % — ABNORMAL HIGH (ref 4.8–5.6)

## 2021-03-18 LAB — FOLATE: Folate: 16.8 ng/mL (ref >3.0)

## 2021-03-18 LAB — VITAMIN D 25 HYDROXY (VIT D DEFICIENCY, FRACTURES): Vit D, 25-Hydroxy: 22.1 ng/mL — ABNORMAL LOW (ref 30.0–100.0)

## 2021-03-18 LAB — VITAMIN B1: Thiamine: 141 nmol/L (ref 66.5–200.0)

## 2021-03-22 ENCOUNTER — Other Ambulatory Visit: Payer: Self-pay

## 2021-04-07 ENCOUNTER — Other Ambulatory Visit (HOSPITAL_COMMUNITY)
Admission: RE | Admit: 2021-04-07 | Discharge: 2021-04-07 | Disposition: A | Discharge status: Home / Self Care | Payer: Self-pay | Source: Ambulatory Visit | Attending: Physician Assistant | Admitting: Physician Assistant

## 2021-04-07 ENCOUNTER — Ambulatory Visit: Payer: Self-pay | Attending: Physician Assistant | Admitting: Physician Assistant

## 2021-04-07 ENCOUNTER — Other Ambulatory Visit: Payer: Self-pay

## 2021-04-07 ENCOUNTER — Encounter: Payer: Self-pay | Admitting: Physician Assistant

## 2021-04-07 VITALS — BP 128/80 | HR 98 | Resp 18 | Ht 61.0 in | Wt 247.0 lb

## 2021-04-07 DIAGNOSIS — Z758 Other problems related to medical facilities and other health care: Secondary | ICD-10-CM

## 2021-04-07 DIAGNOSIS — Z124 Encounter for screening for malignant neoplasm of cervix: Secondary | ICD-10-CM | POA: Insufficient documentation

## 2021-04-07 DIAGNOSIS — Z975 Presence of (intrauterine) contraceptive device: Secondary | ICD-10-CM

## 2021-04-07 NOTE — Progress Notes (Signed)
Patient ID: Sharon Hess, female   DOB: 20-Jun-1995, 26 y.o.   MRN: [id number] ? ? ?[facility], is a 26 y.o. female ? ?[id number] ? ?[id number] ? ?DOB - 05-24-1995 ? ?Chief Complaint  ?Patient presents with  ? Gynecologic Exam  ?    ? ?Subjective:  ? ?Sharon Hess is a 26 y.o. female here today pap.  Has nexplanon for [facility].  No new issues or concerns.  Not having periods.  Recent labs essentially normal.   ? ? ?No problems updated. ? ?ALLERGIES: ?No Known Allergies ? ?PAST MEDICAL HISTORY: ?Past Medical History:  ?Diagnosis Date  ? Anemia   ? Fatty liver   ? Hepatitis B   ? Preeclampsia 2021  ? Previous cesarean delivery, antepartum condition or complication [id number]  ? OP arrest at 7 cm--desires TOLAC--consent signed   ? ? ?MEDICATIONS AT HOME: ?Prior to Admission medications   ?Medication Sig Start Date End Date Taking? Authorizing Provider  ?clindamycin-benzoyl peroxide (BENZACLIN) gel Apply topically 2 (two) times daily. ?Patient not taking: Reported on 04/07/2021 03/15/21   Elsie Stain, MD  ?ferrous sulfate (FERROUSUL) 325 (65 FE) MG tablet Take 1 tablet (325 mg total) by mouth 2 (two) times daily. ?Patient not taking: Reported on 03/15/2021 08/08/19   Chancy Milroy, MD  ?ibuprofen (ADVIL) 600 MG tablet Take 1 tablet (600 mg total) by mouth every 8 (eight) hours as needed for mild pain. ?Patient not taking: Reported on 03/15/2021 08/08/19   Chancy Milroy, MD  ?Prenatal Vit-Fe Fumarate-FA (PRENATAL VITAMINS PO) Take 1 tablet by mouth daily. ?Patient not taking: Reported on 03/15/2021    [provider]  ? ? ?ROS: ?Neg HEENT ?Neg resp ?Neg cardiac ?Neg GI ?Neg GU ?Neg MS ?Neg psych ?Neg neuro ? ?Objective:  ? ?Vitals:  ? 04/07/21 1055  ?BP: 128/80  ?Pulse: 98  ?Resp: 18  ?SpO2: 95%  ?Weight: 247 lb (112 kg)  ?Height: 5\' 1"  (1.549 m)  ? ?Exam ?General appearance : Awake, alert, not in any distress. Speech Clear. Not toxic looking ?HEENT: Atraumatic and Normocephalic ?GU:  external genitalia WNL; vagina wnl, cervix only partially visible and pap taken, aptima swab taken.  Bimanual unremarkable ?Extremities: B/L Lower Ext shows no edema, both legs are warm to touch ?Neurology: Awake alert, and oriented X 3, CN II-XII intact, Non focal ?Skin: No Rash ? ?Data Review ?Lab Results  ?Component Value Date  ? HGBA1C 5.7 (H) 03/15/2021  ? HGBA1C 5.1 02/12/2019  ? HGBA1C 5.4 12/01/2014  ? ? ?Assessment & Plan  ? ?1. Cervical cancer screening ?- Cytology - PAP ?- Cervicovaginal ancillary only ? ?2. Nexplanon in place ? ?3. Language barrier ?[employer] interpreters "Claiborne Billings" used and additional time performing visit was required. ? ? ? ? ?Patient have been counseled extensively about nutrition and exercise. Other issues discussed during this visit include: low cholesterol diet, weight control and daily exercise, foot care, annual eye examinations at Ophthalmology, importance of adherence with medications and regular follow-up. We also discussed long term complications of uncontrolled diabetes and hypertension.  ? ?Return if symptoms worsen or fail to improve. ? ?The patient was given clear instructions to go to ER or return to medical center if symptoms don't improve, worsen or new problems develop. The patient verbalized understanding. The patient was told to call to get lab results if they haven't heard anything in the next week.  ? ? ? ? ?Freeman Caldron, PA-C ?[place] ?  [place], [place] ?[phone]   ?04/07/2021, 11:13 AM  ?

## 2021-04-08 LAB — CERVICOVAGINAL ANCILLARY ONLY
Bacterial Vaginitis (gardnerella): NEGATIVE
Candida Glabrata: NEGATIVE
Candida Vaginitis: NEGATIVE
Chlamydia: NEGATIVE
Comment: NEGATIVE
Comment: NEGATIVE
Comment: NEGATIVE
Comment: NEGATIVE
Comment: NEGATIVE
Comment: NORMAL
Neisseria Gonorrhea: NEGATIVE
Trichomonas: NEGATIVE

## 2021-04-08 LAB — CYTOLOGY - PAP
Adequacy: ABSENT
Diagnosis: NEGATIVE

## 2021-04-14 ENCOUNTER — Other Ambulatory Visit: Payer: Self-pay

## 2021-05-07 ENCOUNTER — Emergency Department (HOSPITAL_COMMUNITY)
Admission: EM | Admit: 2021-05-07 | Discharge: 2021-05-08 | Disposition: A | Discharge status: Home / Self Care | Payer: Self-pay | Attending: Emergency Medicine | Admitting: Emergency Medicine

## 2021-05-07 ENCOUNTER — Other Ambulatory Visit: Payer: Self-pay

## 2021-05-07 ENCOUNTER — Encounter (HOSPITAL_COMMUNITY): Payer: Self-pay

## 2021-05-07 DIAGNOSIS — R519 Headache, unspecified: Secondary | ICD-10-CM | POA: Insufficient documentation

## 2021-05-07 DIAGNOSIS — H6093 Unspecified otitis externa, bilateral: Secondary | ICD-10-CM | POA: Insufficient documentation

## 2021-05-07 DIAGNOSIS — H6693 Otitis media, unspecified, bilateral: Secondary | ICD-10-CM | POA: Insufficient documentation

## 2021-05-07 DIAGNOSIS — H6690 Otitis media, unspecified, unspecified ear: Secondary | ICD-10-CM

## 2021-05-07 DIAGNOSIS — H60503 Unspecified acute noninfective otitis externa, bilateral: Secondary | ICD-10-CM

## 2021-05-07 NOTE — ED Triage Notes (Signed)
Pt states both ears are hurting and she wants something for the pain. Pt states the pain has been ongoing for 3 days. Pt also endorses swelling and feeling like she cannot hear out of both ears. Pt states the pain is worse at night ?

## 2021-05-08 IMAGING — US US OB COMP LESS 14 WK
1 series · 15 of 18 positions shown · non-contrast
Comparison: None.

CLINICAL DATA: Lower abdominal pain for 2-3 months

EXAM:
OBSTETRIC <14 WK ULTRASOUND
TECHNIQUE: Transabdominal ultrasound was performed for evaluation of the
gestation as well as the maternal uterus and adnexal regions.

[Series 1: us ob comp less 14 wk · 15 of 18 slices shown]
[im 1/18]
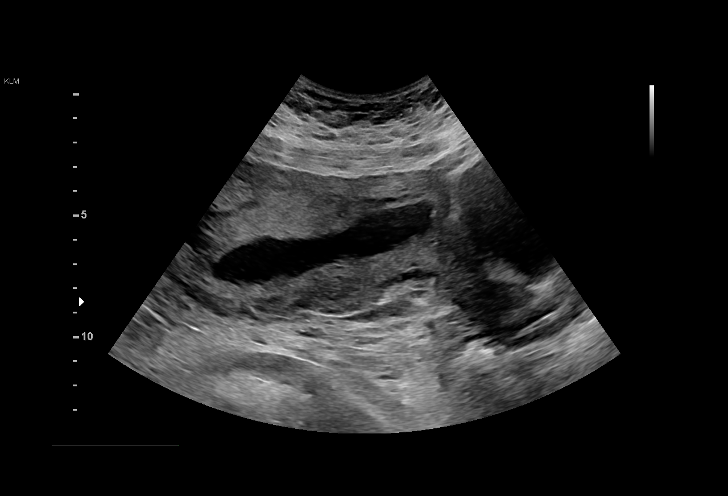
[im 2/18]
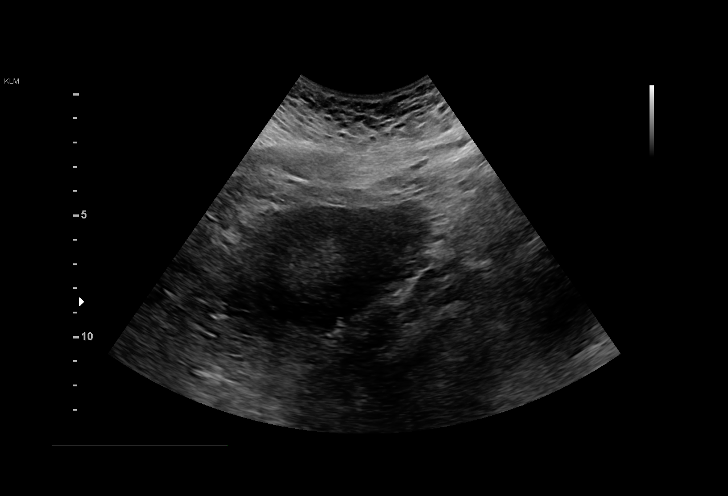
[im 4/18]
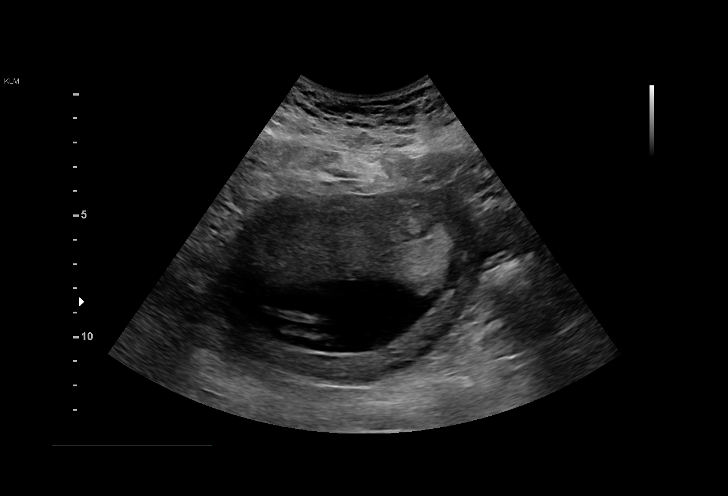
[im 5/18]
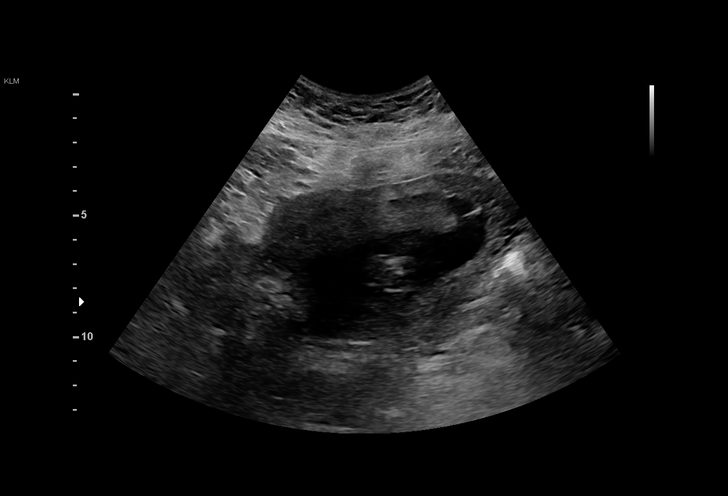
[im 6/18]
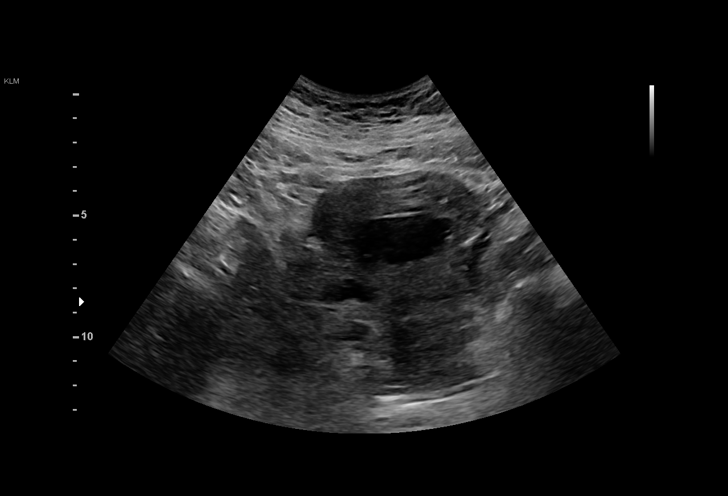
[im 7/18]
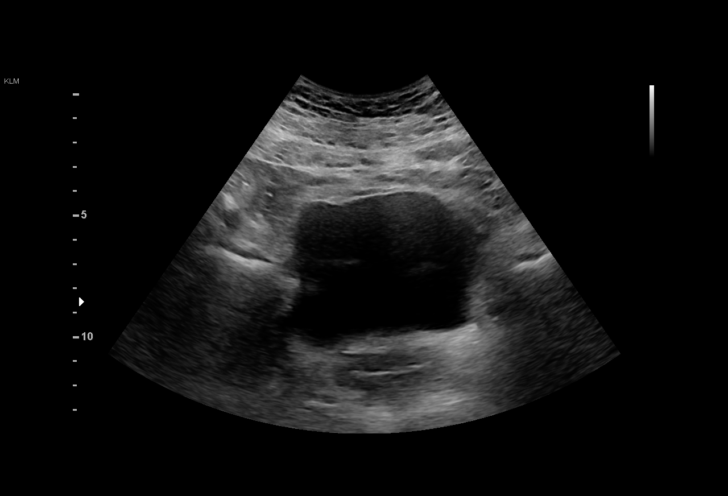
[im 8/18]
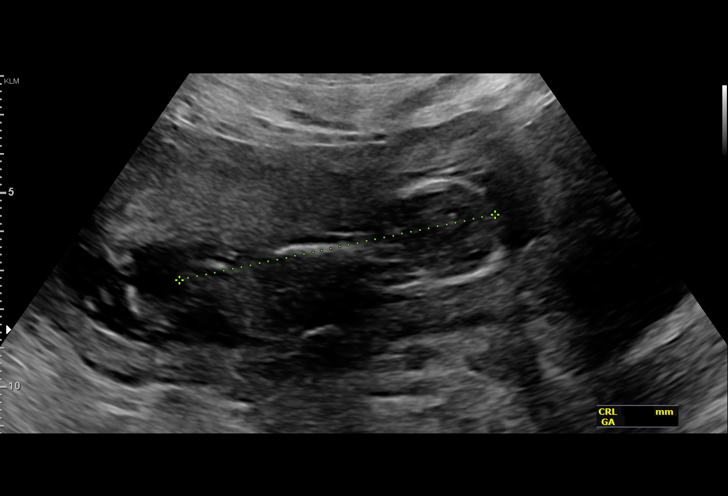
[im 10/18]
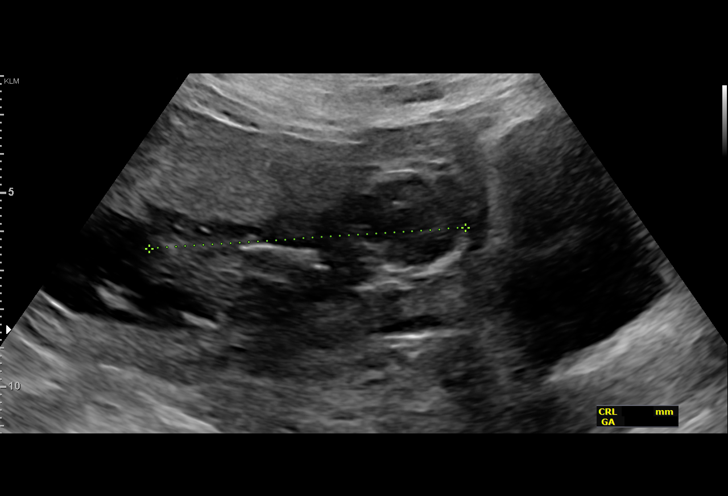
[im 11/18]
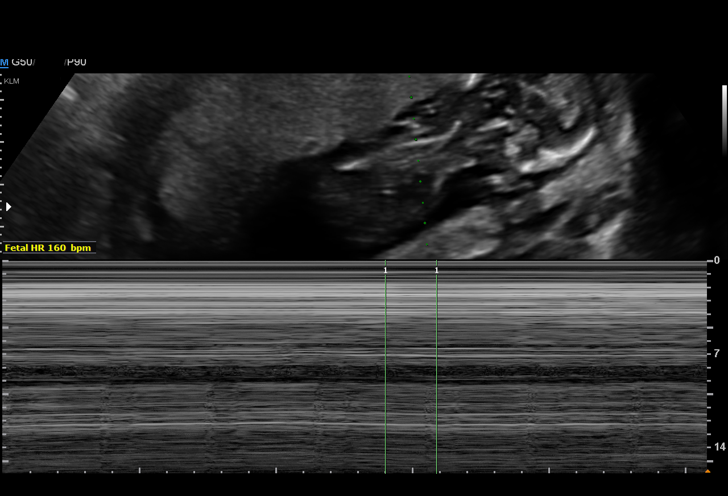
[im 12/18]
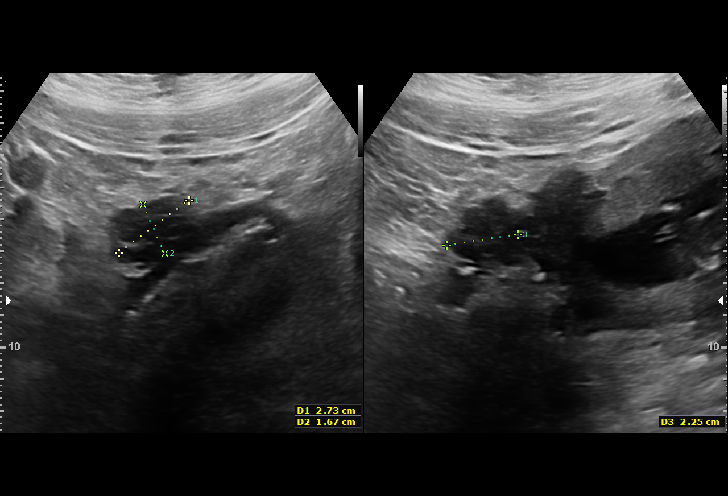
[im 13/18]
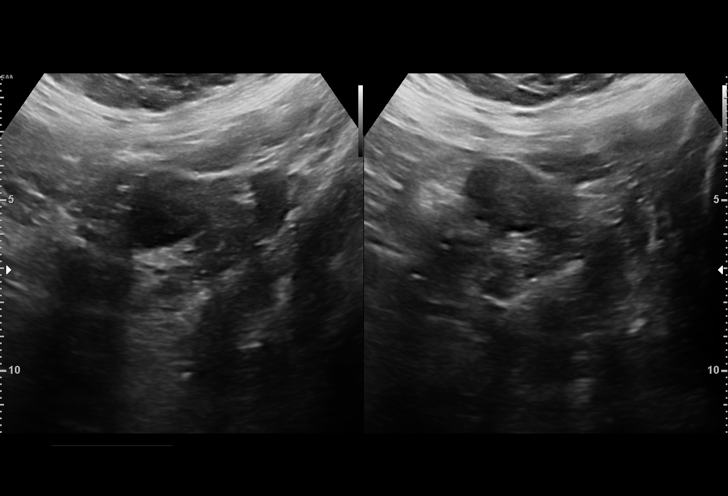
[im 14/18]
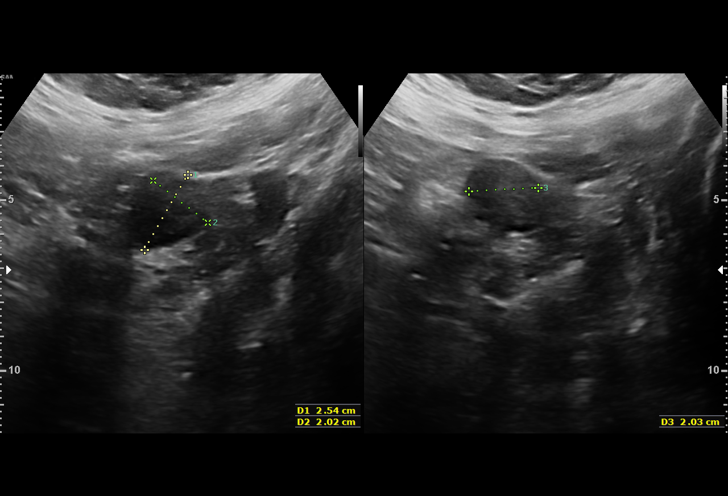
[im 16/18]
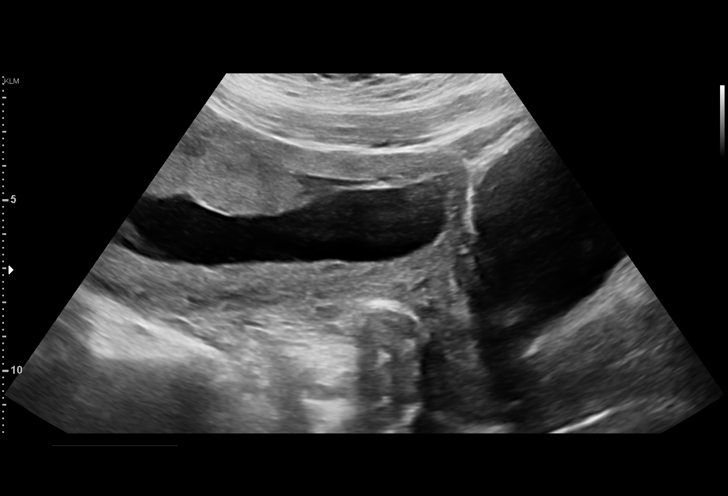
[im 17/18]
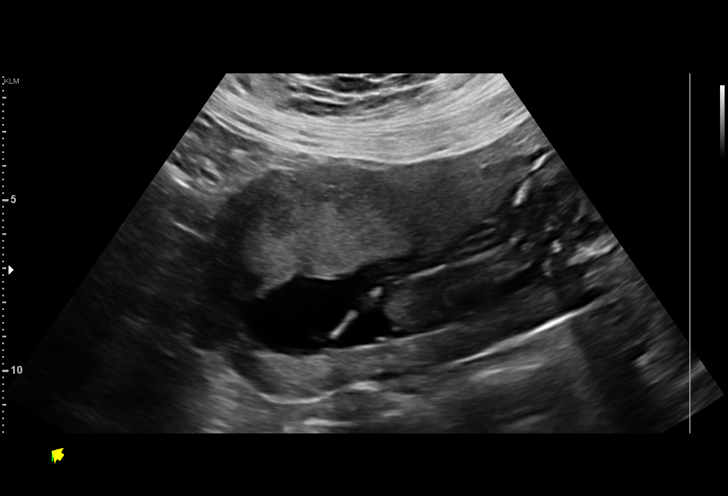
[im 18/18]
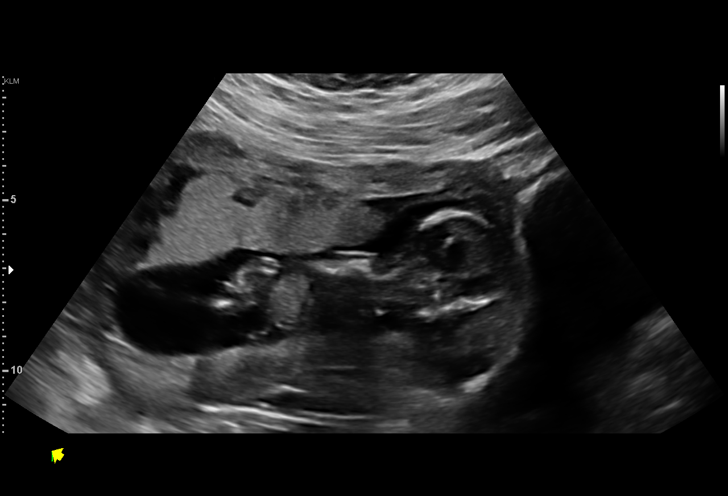

[15 of 18 positions shown; findings below may reference images not displayed]

FINDINGS: Intrauterine gestational sac: Single

Yolk sac:  Not seen

Embryo:  Visualized.

Cardiac Activity: Visualized.

Heart Rate: 160 bpm

CRL: 82.2 mm   14 w 1 d                  US EDC: 07/30/2019

Subchorionic hemorrhage:  None visualized.

Maternal uterus/adnexae: Ovaries are within normal limits. Left
ovary measures 2.5 x 2 x 2 cm. The right ovary measures 2.7 x 1.7 x
2.3 cm. No significant free fluid.
IMPRESSION: Single viable intrauterine pregnancy as above. No specific
abnormality is seen

## 2021-05-08 MED ORDER — IBUPROFEN 400 MG PO TABS
600.0000 mg | ORAL_TABLET | Freq: Once | ORAL | Status: AC
  Administered 2021-05-08: 600 mg via ORAL
  Filled 2021-05-08: qty 1

## 2021-05-08 MED ORDER — OFLOXACIN 0.3 % OP SOLN
5.0000 [drp] | Freq: Once | OPHTHALMIC | Status: AC
  Administered 2021-05-08: 5 [drp] via OTIC
  Filled 2021-05-08 (×2): qty 5

## 2021-05-08 MED ORDER — AMOXICILLIN-POT CLAVULANATE 875-125 MG PO TABS
1.0000 | ORAL_TABLET | Freq: Once | ORAL | Status: AC
  Administered 2021-05-08: 1 via ORAL
  Filled 2021-05-08: qty 1

## 2021-05-08 MED ORDER — AMOXICILLIN-POT CLAVULANATE 875-125 MG PO TABS: 1.0000 | ORAL_TABLET | Freq: Two times a day (BID) | ORAL | 0 refills | Status: DC

## 2021-05-08 NOTE — Discharge Instructions (Addendum)
Please please place 5 drops in each ear daily for the next 7 days.  You should also take oral antibiotics as prescribed twice daily for the next 7 days.  Take antibiotics with food on your stomach to prevent stomach upset.  If ear pain is not improving you should follow-up with your primary care provider.  To help treat pain you can use ibuprofen 600 mg and Tylenol 1000 mg every 6 hours. ? ?For dandruff you can use medicated Selsun Blue shampoo, if this is not resolving you will need to follow-up with your regular doctor regarding this. ? ?Coloque 5 gotas en cada o?do diariamente durante los pr?ximos 7 d?as. Tambi?n debe tomar antibi?ticos orales seg?n lo recetado dos veces al d?a durante los pr?ximos 7 d?as. Tome antibi?ticos con alimentos en el est?mago para Programme researcher, broadcasting/film/video. Si el dolor de o?do no mejora, debe hacer un seguimiento con su proveedor de atenci?n primaria. Para ayudar a tratar el dolor, puede usar ibuprofeno 600 mg y Tylenol 1000 mg cada 6 horas. ? ?Para la caspa, puede usar [employer]? medicado Selsun Blue, si esto no se resuelve, deber? hacer un seguimiento con su m?dico habitual al [employer]. ? ?

## 2021-05-08 NOTE — ED Provider Notes (Signed)
?[facility] EMERGENCY DEPARTMENT ?Provider Note ? ? ?CSN: [id number] ?Arrival date & time: 05/07/21  1915 ? ?  ? ?History ? ?Chief Complaint  ?Patient presents with  ? Otalgia  ? ? ?Sharon Hess is a 26 y.o. female. ? ?Sharon Hess is a 26 y.o. female with a history of hepatitis and anemia, who presents to the emergency department for evaluation of bilateral otalgia.  Patient reports she has been having ear pain for the past 3 days.  She reports it feels like her ears are swollen and something is in the canal and she reports decreased hearing from both ears.  No drainage.  She also reports an associated headache.  No fevers or chills, no nasal congestion or rhinorrhea, denies associated cough or congestion.  No fevers or chills.  She took 1 dose of Tylenol without much improvement.  No other aggravating or alleviating factors.   ? ?The history is provided by the patient. The history is limited by a language barrier. A language interpreter was used.  ? ?  ? ?Home Medications ?Prior to Admission medications   ?Medication Sig Start Date End Date Taking? Authorizing Provider  ?clindamycin-benzoyl peroxide (BENZACLIN) gel Apply topically 2 (two) times daily. ?Patient not taking: Reported on 04/07/2021 03/15/21   Storm Frisk, MD  ?ferrous sulfate (FERROUSUL) 325 (65 FE) MG tablet Take 1 tablet (325 mg total) by mouth 2 (two) times daily. ?Patient not taking: Reported on 03/15/2021 08/08/19   Hermina Staggers, MD  ?ibuprofen (ADVIL) 600 MG tablet Take 1 tablet (600 mg total) by mouth every 8 (eight) hours as needed for mild pain. ?Patient not taking: Reported on 03/15/2021 08/08/19   Hermina Staggers, MD  ?Prenatal Vit-Fe Fumarate-FA (PRENATAL VITAMINS PO) Take 1 tablet by mouth daily. ?Patient not taking: Reported on 03/15/2021    [provider]  ?   ? ?Allergies    ?Patient has no known allergies.   ? ?Review of Systems   ?Review of Systems  ?Constitutional:  Negative for  chills and fever.  ?HENT:  Positive for ear pain and hearing loss. Negative for congestion, ear discharge, facial swelling, rhinorrhea and sore throat.   ?Respiratory:  Negative for cough and shortness of breath.   ?Cardiovascular:  Negative for chest pain.  ?Skin:  Negative for rash.  ?Neurological:  Positive for headaches.  ? ?Physical Exam ?Updated Vital Signs ?BP 121/86 (BP Location: Right Arm)   Pulse (!) 106   Temp 98.7 ?F (37.1 ?C) (Oral)   Resp 17   Ht 5\' 2"  (1.575 m)   Wt 108.9 kg   SpO2 98%   BMI 43.90 kg/m?  ?Physical Exam ?Vitals and nursing note reviewed.  ?Constitutional:   ?   General: She is not in acute distress. ?   Appearance: Normal appearance. She is well-developed. She is not diaphoretic.  ?HENT:  ?   Head: Normocephalic and atraumatic.  ?   Ears:  ?   Comments: Bilateral ear canals edematous and erythematous, difficult to visualize TM but there does appear to be some bulging and erythema I have low suspicion for ICH, no pre or post auricular adenopathy, no mastoid tenderness, no erythema or swelling of the auricle bilaterally. ?   Nose: No congestion or rhinorrhea.  ?   Mouth/Throat:  ?   Mouth: Mucous membranes are moist.  ?   Pharynx: Oropharynx is clear. No oropharyngeal exudate or posterior oropharyngeal erythema.  ?Eyes:  ?  General:     ?   Right eye: No discharge.     ?   Left eye: No discharge.  ?   Pupils: Pupils are equal, round, and reactive to light.  ?Cardiovascular:  ?   Rate and Rhythm: Normal rate.  ?Pulmonary:  ?   Effort: Pulmonary effort is normal. No respiratory distress.  ?   Comments: Respirations equal and unlabored, patient able to speak in full sentences, lungs clear to auscultation bilaterally  ?Abdominal:  ?   Comments: Abdomen soft, nondistended, nontender to palpation in all quadrants without guarding or peritoneal signs  ?Musculoskeletal:     ?   General: No deformity.  ?   Cervical back: Neck supple.  ?Skin: ?   General: Skin is warm and dry.  ?    Capillary Refill: Capillary refill takes less than 2 seconds.  ?Neurological:  ?   Mental Status: She is alert and oriented to person, place, and time.  ?   Coordination: Coordination normal.  ?   Comments: Speech is clear, able to follow commands ?Moves extremities without ataxia, coordination intact  ?Psychiatric:     ?   Mood and Affect: Mood normal.     ?   Behavior: Behavior normal.  ? ? ?ED Results / Procedures / Treatments   ?Labs ?(all labs ordered are listed, but only abnormal results are displayed) ?Labs Reviewed - No data to display ? ?EKG ?None ? ?Radiology ?No results found. ? ?Procedures ?Procedures  ? ? ?Medications Ordered in ED ?Medications  ?ibuprofen (ADVIL) tablet 600 mg (600 mg Oral Given 05/08/21 0250)  ?ofloxacin (OCUFLOX) 0.3 % ophthalmic solution 5 drop (5 drops Both EARS Given 05/08/21 0250)  ?amoxicillin-clavulanate (AUGMENTIN) 875-125 MG per tablet 1 tablet (1 tablet Oral Given 05/08/21 0249)  ? ? ?ED Course/ Medical Decision Making/ A&P ?  ?                        ?Medical Decision Making ? ?This patient presents to the ED for concern of bilateral ear pain, this involves an extensive number of treatment options, and is a complaint that carries with it a high risk of complications and morbidity.  The differential diagnosis includes otitis media, otitis externa, TM perforation, cerumen impaction, mastoiditis, malignant otitis externa ? ? ?Additional history obtained: ? ?External records from outside source obtained and reviewed including PCP notes ? ? ?Problem List / ED Course / Critical interventions / Medication management ? ?Patient with bilateral otalgia for 3 days, on exam she has erythematous and edematous canals but also has some erythema and bulging of the TMs.   ?I ordered medication including ibuprofen for pain as well as ofloxacin drops and Augmentin ?Reevaluation of the patient after these medicines showed that the patient improved ?I have reviewed the patients home medicines and  have made adjustments as needed ? ? ?Social Determinants of Health: ? ?Language barrier ? ? ?Disposition: ? ?At this time feel patient is appropriate for discharge home with antibiotic treatment for otitis externa and media, exam is not concerning for mastoiditis or malignant otitis externa.  Stressed the importance of completing antibiotics, and using Motrin and Tylenol as needed for pain.  PCP follow-up as needed.  Return precautions provided.  All instructions provided with Spanish interpreter and patient expresses understanding and agreement. ? ? ? ? ? ? ? ? ?Final Clinical Impression(s) / ED Diagnoses ?Final diagnoses:  ?Acute otitis media, unspecified otitis media type  ?  Acute otitis externa of both ears, unspecified type  ? ? ?Rx / DC Orders ?ED Discharge Orders   ? ? None  ? ?  ? ? ?  ?Dartha LodgeFord, Kewanna Kasprzak N, PA-C ?05/08/21 0302 ? ?  ?Pollyann SavoySheldon, Charles B, MD ?05/08/21 [phone] ? ?

## 2021-06-03 ENCOUNTER — Ambulatory Visit: Payer: Self-pay

## 2021-06-03 NOTE — Telephone Encounter (Signed)
This encounter was created in error - please disregard.

## 2021-06-03 NOTE — Telephone Encounter (Signed)
Sharon Hess [id number] ?Chief Complaint: nipple discharge ?Symptoms: breast leaking like breast milk, clear but oily, swelling ?Frequency: yesterday ?Pertinent Negatives: Patient denies pain ?Disposition: [] ED /[] Urgent Care (no appt availability in office) / [x] Appointment(In office/virtual)/ []  [facility]/ [] Home Care/ [] Refused Recommended Disposition /[] [facility]/ []  Follow-up with PCP ?Additional Notes: advised pt no appts available. She has appt scheduled for 07/07/21 with PCP. Advised pt could schedule with UC. Pt prefers to wait to be seen at office. Advised if symptoms worsen to call back.  ? ?Reason for Disposition ? [1] Nipple discharge AND [2] not bloody (e.g., clear, white, yellow, brown, green) ? ?Answer Assessment - Initial Assessment Questions ?1. SYMPTOM: "What's the main symptom you're concerned about?"  (e.g., lump, pain, rash, nipple discharge) ?    Nipple discharge ?2. LOCATION: "Where is the sx located?" ?    Both breasts but left more than right ?3. ONSET: "When did sx  start?" ?    yesterday ?6. OTHER SYMPTOMS: "Do you have any other symptoms?" (e.g., fever, breast pain, redness or rash, nipple discharge) ?    Swelling ?7. PREGNANCY-BREASTFEEDING: "Is there any chance you are pregnant?" "When was your last menstrual period?" "Are you breastfeeding?" ?    Hasnt taken a test, doesn't have period ? ?Protocols used: Breast Symptoms-A-AH ? ?

## 2021-06-03 NOTE — Telephone Encounter (Signed)
Attempt to call patient with assistance of Spanish speaking employee to schedule appt for Tues 5/16 in the afternoon.  ? ?Unable to reach.  ?

## 2021-06-11 IMAGING — US US MFM OB DETAIL+14 WK
1 series · 13 of 28 positions shown · non-contrast
Comparison: none

[Series 1: us mfm ob detail+14 wk · 65 acquisitions, 13 frames shown]
[im 3/65]
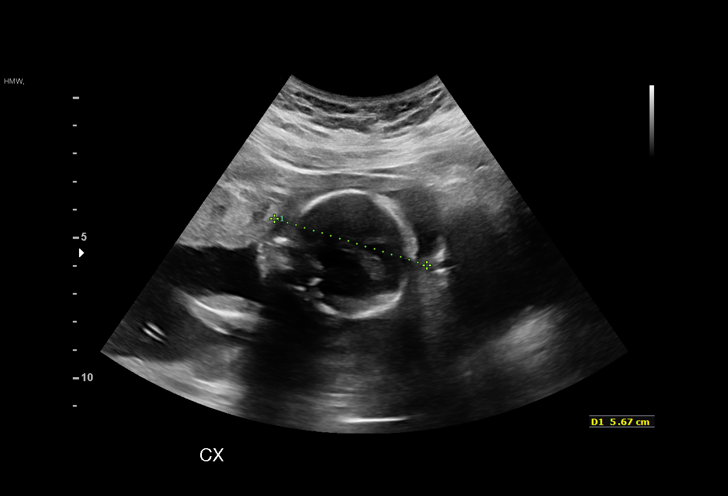
[im 8/65]
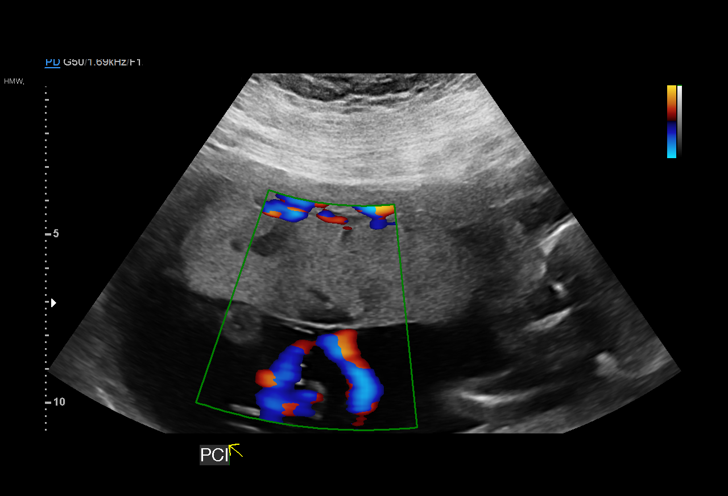
[im 12/65]
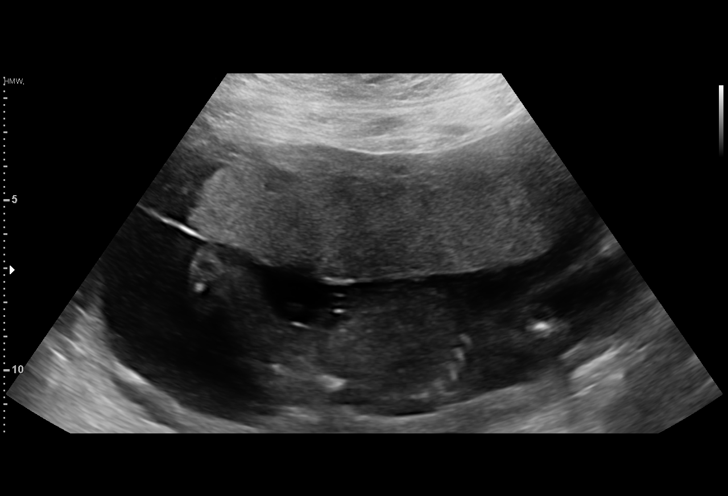
[im 17/65]
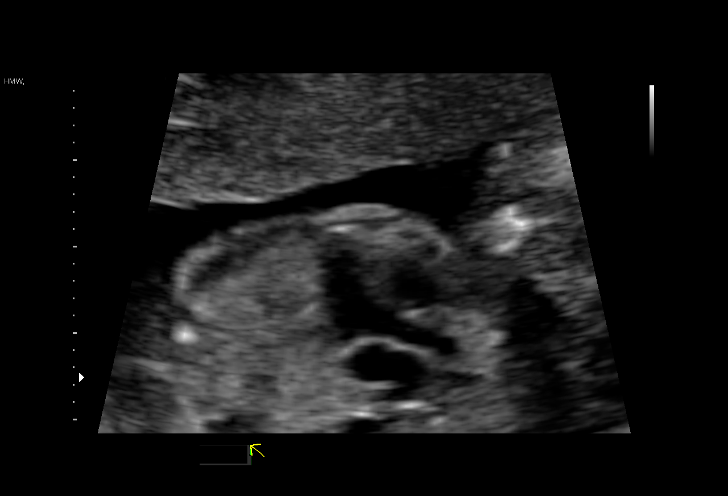
[im 22/65]
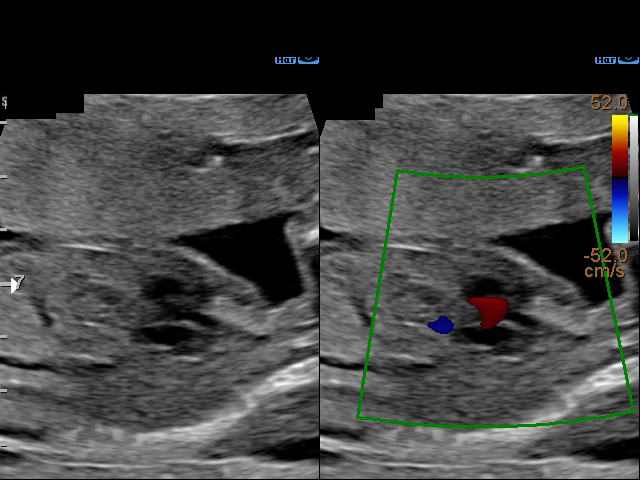
[im 27/65]
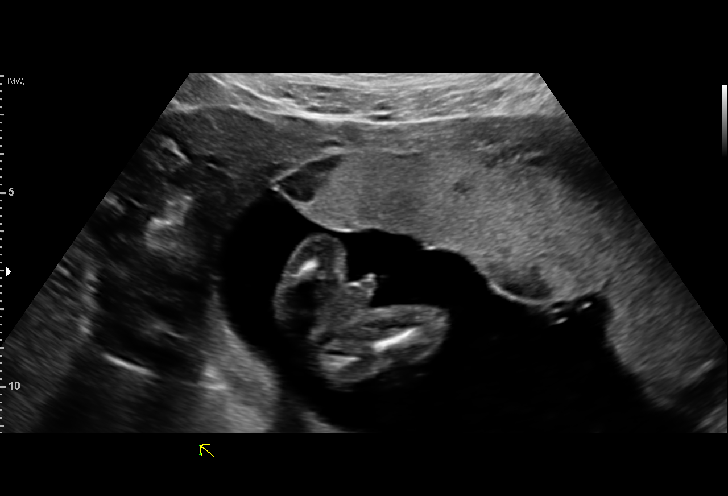
[im 34/65]
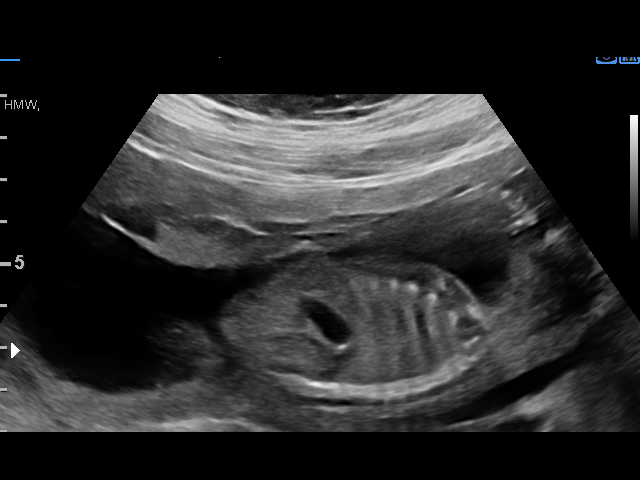
[im 38/65]
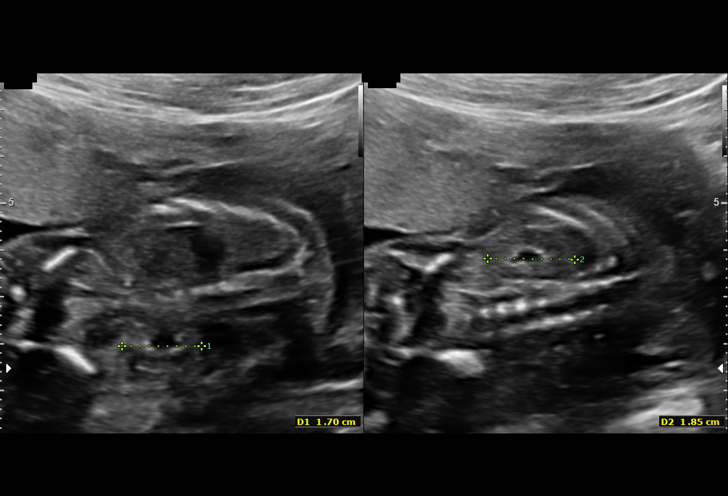
[im 43/65]
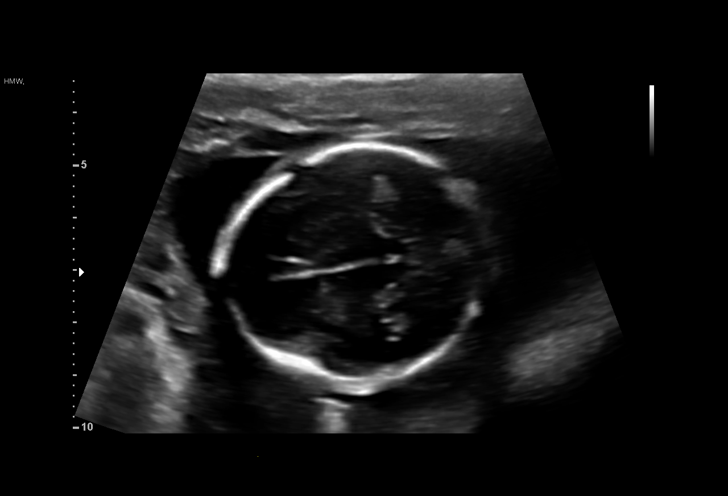
[im 48/65]
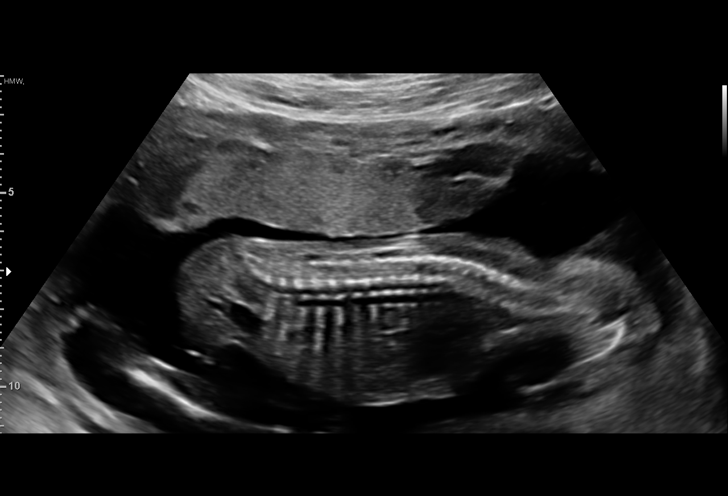
[im 53/65]
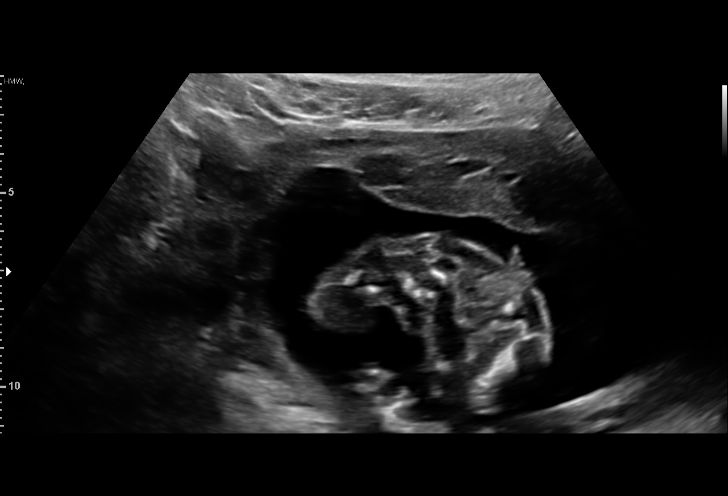
[im 57/65]
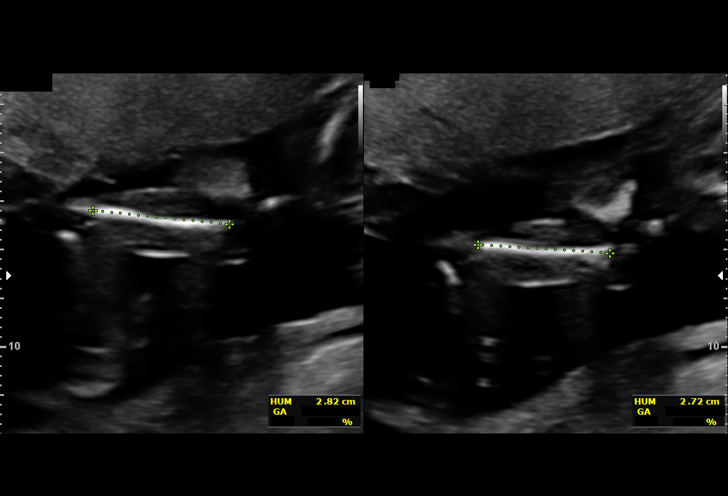
[im 62/65]
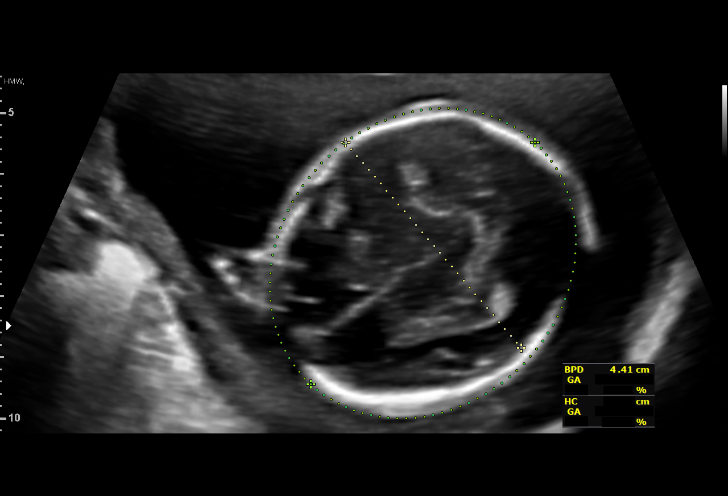

[13 of 28 positions shown; findings below may reference images not displayed]

[place]

                                                            [address]

 ----------------------------------------------------------------------

 ----------------------------------------------------------------------
Indications

  19 weeks gestation of pregnancy
  Encounter for antenatal screening for
  malformations
  Obesity complicating pregnancy, second
  trimester (pregravid BMI 37.81)
  Medical complication of pregnancy (fatty
  liver disease)
  Previous cesarean delivery, antepartum x 2
 ----------------------------------------------------------------------
Fetal Evaluation

 Num Of Fetuses:         1
 Fetal Heart Rate(bpm):  144
 Cardiac Activity:       Observed
 Presentation:           Cephalic
 Placenta:               Anterior
 P. Cord Insertion:      Visualized, central

 Amniotic Fluid
 AFI FV:      Within normal limits

                             Largest Pocket(cm)

Biometry

 BPD:      44.4  mm     G. Age:  19w 3d         69  %    CI:        79.49   %    70 - 86
                                                         FL/HC:      16.6   %    16.1 -
 HC:      157.4  mm     G. Age:  18w 4d         25  %    HC/AC:      1.07        1.09 -
 AC:      147.7  mm     G. Age:  20w 0d         79  %    FL/BPD:     59.0   %
 FL:       26.2  mm     G. Age:  18w 0d         12  %    FL/AC:      17.7   %    20 - 24
 HUM:      27.7  mm     G. Age:  18w 6d         47  %
 CER:      20.2  mm     G. Age:  19w 1d         55  %
 Est. FW:     271  gm    0 lb 10 oz      48  %
OB History

 Gravidity:    3         Term:   2        Prem:   0        SAB:   0
 TOP:          0       Ectopic:  0        Living: 2
Gestational Age

 U/S Today:     19w 0d                                        EDD:   07/30/19
 Best:          19w 0d     Det. By:  Early Exam  (01/30/19)   EDD:   07/30/19
Anatomy

 Cranium:               Appears normal         LVOT:                   Appears normal
 Cavum:                 Appears normal         Aortic Arch:            Not well visualized
 Ventricles:            Appears normal         Ductal Arch:            Appears normal
 Choroid Plexus:        Appears normal         Diaphragm:              Appears normal
 Cerebellum:            Appears normal         Stomach:                Appears normal, left
                                                                       sided
 Posterior Fossa:       Not well visualized    Abdomen:                Appears normal
 Nuchal Fold:           Not well visualized    Abdominal Wall:         Not well visualized
 Face:                  Not well visualized    Cord Vessels:           Appears normal (3
                                                                       vessel cord)
 Lips:                  Not well visualized    Kidneys:                Appear normal
 Palate:                Not well visualized    Bladder:                Appears normal
 Thoracic:              Appears normal         Spine:                  Appears normal
 Heart:                 Not well visualized    Upper Extremities:      Appears normal
 RVOT:                  Not well visualized    Lower Extremities:      Appears normal

 Other:  Heels visualized.
Cervix Uterus Adnexa

 Cervix
 Length:            3.5  cm.
 Normal appearance by transabdominal scan.

 Uterus
 No abnormality visualized.

 Left Ovary
 No adnexal mass visualized.

 Right Ovary
 No adnexal mass visualized.

 Cul De Sac
 No free fluid seen.

 Adnexa
 No abnormality visualized.
Impression

 G3 P2.  History of a term cesarean delivery followed by a
 term VBAC.  Patient reports she has chronic hepatitis B
 infection.  I have reviewed her note she had hepatitis C
 infection in 9436 and recent screening for hepatitis B surface
 antigen was negative.

 She is NOT a carrier of hepatitis B surface antigen.

 She had opted not to screen for fetal aneuploidies.
 We performed fetal anatomy scan. No makers of
 aneuploidies or fetal structural defects are seen. Fetal
 biometry is consistent with her previously-established dates.
 Amniotic fluid is normal and good fetal activity is seen.
 Patient understands the limitations of ultrasound in detecting
 fetal anomalies.
 Placenta is anterior and there is no evidence of previa or
 accreta.
Recommendations

 -An appointment was made for her to return in 4 weeks for
 completion of fetal anatomy.
                 Au[place], Rel[place]as

## 2021-07-07 ENCOUNTER — Encounter: Payer: Self-pay | Admitting: Critical Care Medicine

## 2021-07-07 ENCOUNTER — Ambulatory Visit: Payer: Self-pay | Attending: Critical Care Medicine | Admitting: Critical Care Medicine

## 2021-07-07 ENCOUNTER — Other Ambulatory Visit: Payer: Self-pay

## 2021-07-07 VITALS — BP 126/79 | HR 82 | Temp 99.0°F | Resp 16 | Wt 244.0 lb

## 2021-07-07 DIAGNOSIS — L700 Acne vulgaris: Secondary | ICD-10-CM

## 2021-07-07 DIAGNOSIS — N6452 Nipple discharge: Secondary | ICD-10-CM | POA: Insufficient documentation

## 2021-07-07 DIAGNOSIS — Z124 Encounter for screening for malignant neoplasm of cervix: Secondary | ICD-10-CM

## 2021-07-07 DIAGNOSIS — R200 Anesthesia of skin: Secondary | ICD-10-CM

## 2021-07-07 DIAGNOSIS — Z6841 Body Mass Index (BMI) 40.0 and over, adult: Secondary | ICD-10-CM

## 2021-07-07 DIAGNOSIS — K760 Fatty (change of) liver, not elsewhere classified: Secondary | ICD-10-CM

## 2021-07-07 DIAGNOSIS — H6123 Impacted cerumen, bilateral: Secondary | ICD-10-CM

## 2021-07-07 DIAGNOSIS — D500 Iron deficiency anemia secondary to blood loss (chronic): Secondary | ICD-10-CM

## 2021-07-07 DIAGNOSIS — R202 Paresthesia of skin: Secondary | ICD-10-CM

## 2021-07-07 HISTORY — DX: Impacted cerumen, bilateral: H61.23

## 2021-07-07 MED ORDER — DEBROX 6.5 % OT SOLN
5.0000 [drp] | Freq: Two times a day (BID) | OTIC | 0 refills | Status: DC
  Filled 2021-07-07 – 2021-07-11 (×3): qty 15, 30d supply, fill #0

## 2021-07-07 MED ORDER — VITAMIN D (ERGOCALCIFEROL) 1.25 MG (50000 UNIT) PO CAPS
50000.0000 [IU] | ORAL_CAPSULE | ORAL | 5 refills | Status: DC
  Filled 2021-07-07: qty 5, 35d supply, fill #0

## 2021-07-07 NOTE — Assessment & Plan Note (Signed)
Reassess iron deficiency anemia

## 2021-07-07 NOTE — Progress Notes (Signed)
F/u weight   Numbess in arms, legs, and feet Discharge from nipples

## 2021-07-07 NOTE — Patient Instructions (Addendum)
Follow healthy diet as outlined in the lifestyle medicine handout we gave you along with exercise as described  Hormone levels will be drawn today in the lab regarding your nipple discharge  Take vitamin D weekly this was sent to the pharmacy downstairs, this is a prescription strength stop taking the over-the-counter vitamin D  Stay on the iron supplement daily  Iron levels will be included in the labs today  A Pap smear will be scheduled with one of our providers  Return to see Dr. Delford Field 4 months  Siga una dieta saludable como se describe en el folleto de medicina de estilo de vida que le entregamos junto con el ejercicio como se describe  Los niveles hormonales se extraern [employer] en el laboratorio con respecto a la secrecin del pezn.  Tome vitamina D semanalmente, esto fue enviado a la farmacia de [employer], esta es una concentracin recetada, deje de tomar la vitamina D de venta libre.  Mantngase en el suplemento de hierro [employer] de hierro se incluirn en los laboratorios hoy.  Se programar una prueba de Papanicolaou con uno de nuestros proveedores  Volver a ver al Dr. Delford Field 4 meses

## 2021-07-07 NOTE — Progress Notes (Signed)
Established Patient Office Visit  Subjective   Patient ID: Sharon Hess, female    DOB: August 04, 1995  Age: 26 y.o. MRN: [id number]  Primary care follow up  Patient presents for 4 month follow up.  This visit was assisted by Spanish video interpreter Sharon Hess [phone] Sharon Hess is a 26 year old female with a history of hepatic steatosis and obesity presents today with paresthesia and nipple discharge. Patient seen in February for numbness and tingling. Labs drawn at visit normal however Vitamin D low. Patient states the numbness is worsening and occurs in her hands when she is on the phone or in her feet with prolonged sitting. Nothing seems to improve condition. Currently taking iron and vitamin D over the counter. Denies any trauma.  Patient states she started to experience nipple discharge. She describes the discharge as clear and only occurs at night when she is in bed. Patient is using Nexplanon hormonal implant for birth control. Not currently having a menstrual cycle. Denies fever, pain, or rash.  Additionally she complains of intermittent dizziness after eating and then becomes very anxious. She states she is doing her best at eating healthier food options such as fresh fruit or vegetables. Patient states she is exercising everyday. Denies alcohol or substance abuse.  She was seen in the Emergency Room recently for bilateral otitis media. Infection has cleared however experiencing slight hearing loss. She is concerned for cerumen impaction. Previous rash on face has improved and looks much better.  Will order Follicle Stimulating, Luteinizing Hormone, and Iron studies today. PAP smear up to date (03/2021)      Patient Active Problem List   Diagnosis Date Noted   Bilateral impacted cerumen 07/07/2021   Nipple discharge 07/07/2021   Iron deficiency anemia due to chronic blood loss 07/07/2021   Acne 03/15/2021   Morbid obesity with BMI of 40.0-44.9, adult ([place])  03/15/2021   Cervical cancer screening 03/15/2021   Numbness and tingling in both hands 03/15/2021   History of postpartum hemorrhage 08/06/2019   Language barrier 04/28/2019   History of cesarean section    Fatty liver disease, nonalcoholic 53/96/7289   Past Medical History:  Diagnosis Date   Anemia    Fatty liver    Hepatitis B    Preeclampsia 2021   Previous cesarean delivery, antepartum condition or complication 7/91/5041   OP arrest at 7 cm--desires TOLAC--consent signed    Past Surgical History:  Procedure Laterality Date   ABDOMINAL SURGERY     c-section   CESAREAN SECTION     CESAREAN SECTION  08/21/2011   Procedure: CESAREAN SECTION;  Surgeon: Emily Filbert, MD;  Location: [facility];  Service: Gynecology;  Laterality: N/A;  Primary cesarean section of baby girl  at 27  APGAR 9/9   Social History   Tobacco Use   Smoking status: Never   Smokeless tobacco: Never  Vaping Use   Vaping Use: Never used  Substance Use Topics   Alcohol use: No   Drug use: No   Social History   Socioeconomic History   Marital status: Significant Other    Spouse name: Not on file   Number of children: Not on file   Years of education: Not on file   Highest education level: Not on file  Occupational History   Not on file  Tobacco Use   Smoking status: Never   Smokeless tobacco: Never  Vaping Use   Vaping Use: Never used  Substance and Sexual Activity  Alcohol use: No   Drug use: No   Sexual activity: Yes    Birth control/protection: None  Other Topics Concern   Not on file  Social History Narrative   ** Merged History Encounter **       Social Determinants of Health   Financial Resource Strain: Not on file  Food Insecurity: No Food Insecurity (07/07/2019)   Hunger Vital Sign    Worried About Running Out of Food in the Last Year: Never true    Ran Out of Food in the Last Year: Never true  Transportation Needs: No Transportation Needs (07/07/2019)   PRAPARE -  Hydrologist (Medical): No    Lack of Transportation (Non-Medical): No  Recent Concern: Transportation Needs - Unmet Transportation Needs (05/12/2019)   PRAPARE - Hydrologist (Medical): Yes    Lack of Transportation (Non-Medical): Yes  Physical Activity: Not on file  Stress: Not on file  Social Connections: Not on file  Intimate Partner Violence: Not on file   Family Status  Relation Name Status   Mother  Alive   Father  Alive   Neg Hx  (Not Specified)   Family History  Problem Relation Age of Onset   Alcohol abuse Neg Hx    Arthritis Neg Hx    Asthma Neg Hx    Birth defects Neg Hx    Cancer Neg Hx    COPD Neg Hx    Depression Neg Hx    Diabetes Neg Hx    Drug abuse Neg Hx    Early death Neg Hx    Hearing loss Neg Hx    Heart disease Neg Hx    Hyperlipidemia Neg Hx    Hypertension Neg Hx    Kidney disease Neg Hx    Learning disabilities Neg Hx    Mental illness Neg Hx    Mental retardation Neg Hx    Miscarriages / Stillbirths Neg Hx    Stroke Neg Hx    Vision loss Neg Hx    No Known Allergies  Review of Systems  Constitutional: Negative.  Negative for chills, diaphoresis, fever, malaise/fatigue and weight loss.  HENT:  Positive for hearing loss. Negative for congestion, ear discharge, ear pain, nosebleeds, sore throat and tinnitus.   Eyes: Negative.  Negative for blurred vision, double vision, photophobia and discharge.  Respiratory: Negative.  Negative for cough, hemoptysis, sputum production, shortness of breath, wheezing and stridor.        No excess mucus  Cardiovascular: Negative.  Negative for chest pain, palpitations, orthopnea, claudication, leg swelling and PND.  Gastrointestinal:  Positive for abdominal pain. Negative for blood in stool, constipation, diarrhea, heartburn, melena, nausea and vomiting.  Genitourinary: Negative.  Negative for dysuria, flank pain, frequency, hematuria and urgency.   Musculoskeletal: Negative.  Negative for back pain, falls, joint pain, myalgias and neck pain.  Skin: Negative.  Negative for itching and rash.  Neurological:  Positive for dizziness and tingling. Negative for tremors, sensory change, speech change, focal weakness, seizures, loss of consciousness, weakness and headaches.       Bilateral hand and leg paresthesia  Endo/Heme/Allergies: Negative.  Negative for environmental allergies and polydipsia. Does not bruise/bleed easily.  Psychiatric/Behavioral: Negative.  Negative for depression, hallucinations, memory loss, substance abuse and suicidal ideas. The patient is not nervous/anxious and does not have insomnia.   All other systems reviewed and are negative.     Objective:  BP 126/79 (BP Location: Left Arm, Patient Position: Sitting, Cuff Size: Large)   Pulse 82   Temp 99 F (37.2 C) (Oral)   Resp 16   Wt 244 lb (110.7 kg)   LMP  (LMP Unknown) Comment: IUD implant x 2 years  SpO2 97%   Breastfeeding No   BMI 44.63 kg/m  BP Readings from Last 3 Encounters:  07/07/21 126/79  05/08/21 120/80  04/07/21 128/80   Wt Readings from Last 3 Encounters:  07/07/21 244 lb (110.7 kg)  05/07/21 240 lb (108.9 kg)  04/07/21 247 lb (112 kg)      Physical Exam Exam conducted with a chaperone present.  Constitutional:      General: She is not in acute distress.    Appearance: She is obese.  HENT:     Right Ear: There is impacted cerumen.     Left Ear: There is impacted cerumen.     Nose: Nose normal.     Mouth/Throat:     Mouth: Mucous membranes are moist.     Pharynx: Oropharynx is clear.  Cardiovascular:     Rate and Rhythm: Normal rate and regular rhythm.     Pulses: Normal pulses.     Heart sounds: Normal heart sounds.  Pulmonary:     Effort: Pulmonary effort is normal.     Breath sounds: Normal breath sounds.  Chest:  Breasts:    Breasts are symmetrical.     Right: Normal. No swelling, bleeding, inverted nipple, mass,  nipple discharge, skin change or tenderness.     Left: Normal. No swelling, bleeding, inverted nipple, mass, nipple discharge, skin change or tenderness.  Abdominal:     General: Abdomen is flat. Bowel sounds are normal.     Palpations: Abdomen is soft.     Tenderness: There is abdominal tenderness in the suprapubic area. Negative signs include Murphy's sign and McBurney's sign.  Neurological:     General: No focal deficit present.  Psychiatric:        Mood and Affect: Mood normal.        Behavior: Behavior normal.      No results found for any visits on 07/07/21.  Last CBC Lab Results  Component Value Date   WBC 8.4 12/22/2020   HGB 14.0 12/22/2020   HCT 42.2 12/22/2020   MCV 90.0 12/22/2020   MCH 29.9 12/22/2020   RDW 12.7 12/22/2020   PLT 289 72/62/0355   Last metabolic panel Lab Results  Component Value Date   GLUCOSE 97 03/15/2021   NA 138 03/15/2021   K 4.4 03/15/2021   CL 104 03/15/2021   CO2 22 03/15/2021   BUN 8 03/15/2021   CREATININE 0.62 03/15/2021   EGFR 127 03/15/2021   CALCIUM 9.1 03/15/2021   PROT 6.9 03/15/2021   ALBUMIN 4.9 03/15/2021   LABGLOB 2.0 03/15/2021   AGRATIO 2.5 (H) 03/15/2021   BILITOT 0.9 03/15/2021   ALKPHOS 70 03/15/2021   AST 17 03/15/2021   ALT 30 03/15/2021   ANIONGAP 9 12/22/2020   Last lipids Lab Results  Component Value Date   CHOL 126 03/15/2021   HDL 34 (L) 03/15/2021   LDLCALC 72 03/15/2021   TRIG 105 03/15/2021   CHOLHDL 3.7 03/15/2021   Last hemoglobin A1c Lab Results  Component Value Date   HGBA1C 5.7 (H) 03/15/2021   Last thyroid functions Lab Results  Component Value Date   TSH 1.602 12/22/2020   Last vitamin D Lab Results  Component Value Date  VD25OH 22.1 (L) 03/15/2021   Last vitamin B12 and Folate Lab Results  Component Value Date   FOLATE 16.8 03/15/2021    The ASCVD Risk score (Arnett DK, et al., 2019) failed to calculate for the following reasons:   The 2019 ASCVD risk score is only  valid for ages 22 to 72    Assessment & Plan:   Problem List Items Addressed This Visit       Digestive   Fatty liver disease, nonalcoholic    Liver function normal in February. Hand out given to follow and counseled patient on dietary changes and exercise. The following Lifestyle Medicine recommendations according to [facility]) were discussed and offered to patient who agrees to start the journey:  A. Whole Foods, Plant-based plate comprising of fruits and vegetables, plant-based proteins, whole-grain carbohydrates was discussed in detail with the patient.   A list for source of those nutrients were also provided to the patient.  Patient will use only water or unsweetened tea for hydration. B.  The need to stay away from risky substances including alcohol, smoking; obtaining 7 to 9 hours of restorative sleep, at least 150 minutes of moderate intensity exercise weekly, the importance of healthy social connections,  and stress reduction techniques were discussed. .   Will continue to monitor.        Nervous and Auditory   Bilateral impacted cerumen    Partial obstruction bilaterally of the ears with cerumen  We will give Debrox to take at home        Musculoskeletal and Integument   Acne    Improvement in Acne on visit today. Patient did not pick up last prescription. No need for medication at this time.          Other   Morbid obesity with BMI of 40.0-44.9, adult ([place])    Morbid obesity. Hemoglobin A1C 5.7 in February.   I further reiterated the importance over a food as medicine diet and instructed her to continue exercising. See fatty liver assessment  Lipid Panel and Hemoglobin A1C at next visit.      Cervical cancer screening    Negative PAP smear 03/2021.      Numbness and tingling in both hands - Primary    Numbness and tingling in both hands has worsened and now experiencing in her lower extremity. Lab work normal but  showed low Vitamin D. Patient was taking an over the counter Vitamin D.  Ergocalciferol sent to pharmacy. Continue Iron Supplement.  Will consider cervical and lumbar films at next visit if condition not improving and iron studies are normal.  History of iron deficiency anemia reassess same       Nipple discharge    Assess LH FSH levels      Relevant Orders   FSH/LH   Iron deficiency anemia due to chronic blood loss    Reassess iron deficiency anemia      Relevant Medications   ferrous sulfate 324 MG TBEC   Other Relevant Orders   Iron, TIBC and Ferritin Panel  40 minutes spent multiple systems assessed complex decision making complicated by language barrier  Return in about 4 months (around 11/06/2021).    Asencion Noble, MD

## 2021-07-07 NOTE — Progress Notes (Deleted)
New Patient Office Visit  Subjective:  Patient ID: Sharon Hess, female    DOB: 09-Apr-1995  Age: 26 y.o. MRN: [id number]  CC:  No chief complaint on file.   HPI 02/2021 [facility] presents for primary care to establish.  This visit was assisted by in person Spanish interpreter Marianna Fuss This patient is establishing here for primary care and has a history of weight gain since her last pregnancy she does have 3 children her last baby was born in July 2021.  Patient did have preeclampsia in the past and has had C-sections previously as well.  Her last Pap smear was in 2019 and she is now due repeat Pap smear.  She does have a Nexplanon birth control device implanted in her left upper arm and is due to be changed at the [facility] where she gets her contraceptive care.  She declines to receive the HPV vaccine.  She does have a single partner and has 3 children with this partner is not married.  She states she does not wish to have further children which is why she has a Nexplanon in place.  She does complain of some dysuria at this time and numbness in the hands and feet.  She was in the emergency room in November close to Thanksgiving had a normal TSH normal metabolic panel and blood counts.  She would like her lipids checked.  She does complain of shortness of breath and chest tightness.  She complains of rash acneiform over the anterior posterior chest and face.  On arrival today blood sugars 101 and blood pressure is 123/75 weight is elevated at  243#  The patient did agree to and received a flu vaccine at this visit she had a tetanus shot in 2021 The patient is unvaccinated for COVID and declines to receive this  6/15 Problem List Items Addressed This Visit       Digestive   Fatty liver disease, nonalcoholic    Liver function in November was normal and will not recheck this we will try to work on a weight loss program for this patient        Musculoskeletal  and Integument   Acne    Diffuse acne seen on the anterior posterior chest and face  Plan to begin BenzaClin cream as needed to affected areas      Relevant Medications   clindamycin-benzoyl peroxide (BENZACLIN) gel     Other   Dysuria    We will reassess with urinalysis      Relevant Orders   Urinalysis   Morbid obesity with BMI of 40.0-44.9, adult ([place]) - Primary    Morbid obesity need to screen for diabetes with hemoglobin A1c check lipid panel  I spent 10 minutes going over a food as medicine diet with this patient and gave her a handout as well explaining that half her plate needs to be the colors of the rainbow.  Also gave her an exercise program of walking 30 minutes 4-5 times a week      Cervical cancer screening    Patient needs cervical cancer screening will refer to one of the providers in the clinic for a Pap smear      Encounter for health-related screening    Check lipid panel      Relevant Orders   Lipid panel   Numbness and tingling in both hands   Relevant Orders   Comprehensive metabolic panel   Hemoglobin A1c  Vitamin B1   VITAMIN D 25 Hydroxy (Vit-D Deficiency, Fractures)   Folate   Other Visit Diagnoses     Screening for diabetes mellitus       Relevant Orders   POCT glucose (manual entry) (Completed)   Hemoglobin A1c   Need for immunization against influenza       Relevant Orders   Flu Vaccine QUAD 33mo+IM (Fluarix, Fluzone & Alfiuria Quad PF) (Completed)      Past Medical History:  Diagnosis Date  . Anemia   . Fatty liver   . Hepatitis B   . Preeclampsia 2021  . Previous cesarean delivery, antepartum condition or complication 02/16/2014   OP arrest at 7 cm--desires TOLAC--consent signed     Past Surgical History:  Procedure Laterality Date  . ABDOMINAL SURGERY     c-section  . CESAREAN SECTION    . CESAREAN SECTION  08/21/2011   Procedure: CESAREAN SECTION;  Surgeon: Allie Bossier, MD;  Location: [facility];  Service: Gynecology;   Laterality: N/A;  Primary cesarean section of baby girl  at 0644  APGAR 9/9    Family History  Problem Relation Age of Onset  . Alcohol abuse Neg Hx   . Arthritis Neg Hx   . Asthma Neg Hx   . Birth defects Neg Hx   . Cancer Neg Hx   . COPD Neg Hx   . Depression Neg Hx   . Diabetes Neg Hx   . Drug abuse Neg Hx   . Early death Neg Hx   . Hearing loss Neg Hx   . Heart disease Neg Hx   . Hyperlipidemia Neg Hx   . Hypertension Neg Hx   . Kidney disease Neg Hx   . Learning disabilities Neg Hx   . Mental illness Neg Hx   . Mental retardation Neg Hx   . Miscarriages / Stillbirths Neg Hx   . Stroke Neg Hx   . Vision loss Neg Hx     Social History   Socioeconomic History  . Marital status: Significant Other    Spouse name: Not on file  . Number of children: Not on file  . Years of education: Not on file  . Highest education level: Not on file  Occupational History  . Not on file  Tobacco Use  . Smoking status: Never  . Smokeless tobacco: Never  Vaping Use  . Vaping Use: Never used  Substance and Sexual Activity  . Alcohol use: No  . Drug use: No  . Sexual activity: Yes    Birth control/protection: None  Other Topics Concern  . Not on file  Social History Narrative   ** Merged History Encounter **       Social Determinants of Health   Financial Resource Strain: Not on file  Food Insecurity: No Food Insecurity (07/07/2019)   Hunger Vital Sign   . Worried About Programme researcher, broadcasting/film/video in the Last Year: Never true   . Ran Out of Food in the Last Year: Never true  Transportation Needs: No Transportation Needs (07/07/2019)   PRAPARE - Transportation   . Lack of Transportation (Medical): No   . Lack of Transportation (Non-Medical): No  Recent Concern: Transportation Needs - Unmet Transportation Needs (05/12/2019)   PRAPARE - Transportation   . Lack of Transportation (Medical): Yes   . Lack of Transportation (Non-Medical): Yes  Physical Activity: Not on file  Stress: Not  on file  Social Connections: Not on file  Intimate Partner  Violence: Not on file    ROS Review of Systems  Constitutional: Negative.   HENT: Negative.  Negative for ear pain, postnasal drip, rhinorrhea, sinus pressure, sore throat, trouble swallowing and voice change.   Eyes: Negative.   Respiratory: Negative.  Negative for apnea, cough, choking, chest tightness, shortness of breath, wheezing and stridor.   Cardiovascular: Negative.  Negative for chest pain, palpitations and leg swelling.  Gastrointestinal: Negative.  Negative for abdominal distention, abdominal pain, nausea and vomiting.  Genitourinary:  Positive for dysuria.  Musculoskeletal: Negative.  Negative for arthralgias and myalgias.  Skin:  Positive for rash.  Allergic/Immunologic: Negative.  Negative for environmental allergies and food allergies.  Neurological:  Positive for numbness. Negative for dizziness, syncope, weakness and headaches.  Hematological: Negative.  Negative for adenopathy. Does not bruise/bleed easily.  Psychiatric/Behavioral: Negative.  Negative for agitation and sleep disturbance. The patient is not nervous/anxious.     Objective:   Today's Vitals: There were no vitals taken for this visit.  Physical Exam Vitals reviewed.  Constitutional:      Appearance: Normal appearance. She is well-developed. She is obese. She is not diaphoretic.  HENT:     Head: Normocephalic and atraumatic.     Nose: No nasal deformity, septal deviation, mucosal edema or rhinorrhea.     Right Sinus: No maxillary sinus tenderness or frontal sinus tenderness.     Left Sinus: No maxillary sinus tenderness or frontal sinus tenderness.     Mouth/Throat:     Mouth: Mucous membranes are moist.     Pharynx: Oropharynx is clear. No oropharyngeal exudate.  Eyes:     General: No scleral icterus.    Conjunctiva/sclera: Conjunctivae normal.     Pupils: Pupils are equal, round, and reactive to light.  Neck:     Thyroid: No  thyromegaly.     Vascular: No carotid bruit or JVD.     Trachea: Trachea normal. No tracheal tenderness or tracheal deviation.  Cardiovascular:     Rate and Rhythm: Normal rate and regular rhythm.     Chest Wall: PMI is not displaced.     Pulses: Normal pulses. No decreased pulses.     Heart sounds: Normal heart sounds, S1 normal and S2 normal. Heart sounds not distant. No murmur heard.    No systolic murmur is present.     No diastolic murmur is present.     No friction rub. No gallop. No S3 or S4 sounds.  Pulmonary:     Effort: Pulmonary effort is normal. No tachypnea, accessory muscle usage or respiratory distress.     Breath sounds: Normal breath sounds. No stridor. No decreased breath sounds, wheezing, rhonchi or rales.  Chest:     Chest wall: No tenderness.  Abdominal:     General: Bowel sounds are normal. There is no distension.     Palpations: Abdomen is soft. Abdomen is not rigid.     Tenderness: There is no abdominal tenderness. There is no guarding or rebound.  Musculoskeletal:        General: Normal range of motion.     Cervical back: Normal range of motion and neck supple. No edema, erythema or rigidity. No muscular tenderness. Normal range of motion.  Lymphadenopathy:     Head:     Right side of head: No submental or submandibular adenopathy.     Left side of head: No submental or submandibular adenopathy.     Cervical: No cervical adenopathy.  Skin:    General: Skin is  warm and dry.     Coloration: Skin is not pale.     Findings: Rash present.     Nails: There is no clubbing.     Comments: Diffuse acneiform rash is seen  Neurological:     Mental Status: She is alert and oriented to person, place, and time.     Sensory: No sensory deficit.  Psychiatric:        Speech: Speech normal.        Behavior: Behavior normal.    Assessment & Plan:   Problem List Items Addressed This Visit   None  Outpatient Encounter Medications as of 07/07/2021  Medication Sig  .  amoxicillin-clavulanate (AUGMENTIN) 875-125 MG tablet Take 1 tablet by mouth 2 (two) times daily. One po bid x 7 days  . clindamycin-benzoyl peroxide (BENZACLIN) gel Apply topically 2 (two) times daily. (Patient not taking: Reported on 04/07/2021)  . ferrous sulfate (FERROUSUL) 325 (65 FE) MG tablet Take 1 tablet (325 mg total) by mouth 2 (two) times daily. (Patient not taking: Reported on 03/15/2021)  . ibuprofen (ADVIL) 600 MG tablet Take 1 tablet (600 mg total) by mouth every 8 (eight) hours as needed for mild pain. (Patient not taking: Reported on 03/15/2021)  . Prenatal Vit-Fe Fumarate-FA (PRENATAL VITAMINS PO) Take 1 tablet by mouth daily. (Patient not taking: Reported on 03/15/2021)   No facility-administered encounter medications on file as of 07/07/2021.    Follow-up: No follow-ups on file.   Shan Levans, MD

## 2021-07-07 NOTE — Assessment & Plan Note (Addendum)
Liver function normal in February. Hand out given to follow and counseled patient on dietary changes and exercise. The following Lifestyle Medicine recommendations according to [employer] [facility]) were discussed and offered to patient who agrees to start the journey:  A. Whole Foods, Plant-based plate comprising of fruits and vegetables, plant-based proteins, whole-grain carbohydrates was discussed in detail with the patient.   A list for source of those nutrients were also provided to the patient.  Patient will use only water or unsweetened tea for hydration. B.  The need to stay away from risky substances including alcohol, smoking; obtaining 7 to 9 hours of restorative sleep, at least 150 minutes of moderate intensity exercise weekly, the importance of healthy social connections,  and stress reduction techniques were discussed. .   Will continue to monitor.

## 2021-07-07 NOTE — Assessment & Plan Note (Addendum)
Numbness and tingling in both hands has worsened and now experiencing in her lower extremity. Lab work normal but showed low Vitamin D. Patient was taking an over the counter Vitamin D.  Ergocalciferol sent to pharmacy. Continue Iron Supplement.  Will consider cervical and lumbar films at next visit if condition not improving and iron studies are normal.  History of iron deficiency anemia reassess same

## 2021-07-07 NOTE — Assessment & Plan Note (Addendum)
Morbid obesity. Hemoglobin A1C 5.7 in February.   I further reiterated the importance over a food as medicine diet and instructed her to continue exercising. See fatty liver assessment  Lipid Panel and Hemoglobin A1C at next visit.

## 2021-07-07 NOTE — Assessment & Plan Note (Signed)
Partial obstruction bilaterally of the ears with cerumen  We will give Debrox to take at home

## 2021-07-07 NOTE — Assessment & Plan Note (Signed)
Negative PAP smear 03/2021.

## 2021-07-07 NOTE — Assessment & Plan Note (Signed)
Improvement in Acne on visit today. Patient did not pick up last prescription. No need for medication at this time.

## 2021-07-07 NOTE — Assessment & Plan Note (Signed)
Assess LH FSH levels

## 2021-07-08 ENCOUNTER — Other Ambulatory Visit (HOSPITAL_COMMUNITY): Payer: Self-pay

## 2021-07-08 LAB — FSH/LH
FSH: 6.6 m[IU]/mL
LH: 8.4 m[IU]/mL

## 2021-07-09 ENCOUNTER — Other Ambulatory Visit: Payer: Self-pay | Admitting: Critical Care Medicine

## 2021-07-09 DIAGNOSIS — N6452 Nipple discharge: Secondary | ICD-10-CM

## 2021-07-09 LAB — IRON,TIBC AND FERRITIN PANEL
Ferritin: 127 ng/mL (ref 15–150)
Iron Saturation: 32 % (ref 15–55)
Iron: 106 ug/dL (ref 27–159)
Total Iron Binding Capacity: 331 ug/dL (ref 250–450)
UIBC: 225 ug/dL (ref 131–425)

## 2021-07-09 LAB — SPECIMEN STATUS REPORT

## 2021-07-09 NOTE — Progress Notes (Signed)
Let pt know iron levels are normal  no need to take any iron pills   Hormone levels also normal    need to have her come in for a prolactin level  I sent order lab visit any time

## 2021-07-11 ENCOUNTER — Telehealth: Payer: Self-pay

## 2021-07-11 ENCOUNTER — Other Ambulatory Visit (HOSPITAL_COMMUNITY): Payer: Self-pay

## 2021-07-11 NOTE — Telephone Encounter (Signed)
-----   Message from Storm Frisk, MD sent at 07/09/2021  8:50 AM EDT ----- Let pt know iron levels are normal  no need to take any iron pills   Hormone levels also normal    need to have her come in for a prolactin level  I sent order lab visit any time

## 2021-07-11 NOTE — Telephone Encounter (Signed)
Pt was called and vm was left. Information was sent to nurse pool.   Interpreter Id: [id number]

## 2021-07-13 ENCOUNTER — Other Ambulatory Visit: Payer: Self-pay

## 2021-10-12 ENCOUNTER — Telehealth: Payer: Self-pay | Admitting: Critical Care Medicine

## 2021-10-12 NOTE — Telephone Encounter (Signed)
Called pt 3xs without success -  to inform this appt could be changed to PCE since provider Freeman Caldron will be at this office 10/13/21. Pt did not answer 3xs and I could not leave a voice msg "voicemail not set up. "

## 2021-10-13 ENCOUNTER — Ambulatory Visit (INDEPENDENT_AMBULATORY_CARE_PROVIDER_SITE_OTHER): Payer: Self-pay | Admitting: Physician Assistant

## 2021-10-13 ENCOUNTER — Ambulatory Visit: Payer: Self-pay | Admitting: Physician Assistant

## 2021-10-13 ENCOUNTER — Encounter: Payer: Self-pay | Admitting: Physician Assistant

## 2021-10-13 VITALS — BP 107/75 | HR 86 | Temp 98.1°F | Resp 16 | Wt 245.6 lb

## 2021-10-13 DIAGNOSIS — N920 Excessive and frequent menstruation with regular cycle: Secondary | ICD-10-CM

## 2021-10-13 DIAGNOSIS — Z789 Other specified health status: Secondary | ICD-10-CM

## 2021-10-13 DIAGNOSIS — L680 Hirsutism: Secondary | ICD-10-CM

## 2021-10-13 NOTE — Patient Instructions (Addendum)
   After having Nexplanon inserted, you may notice spotting and changes in menstrual bleeding. This is common

## 2021-10-13 NOTE — Progress Notes (Signed)
nexplanon side effects, uterine bleeding and noticed hair on chin,checked for polyps Patient has been bleeding of and on  for 1 month since remove of implant

## 2021-10-13 NOTE — Progress Notes (Signed)
Patient ID: Sharon Hess, female   DOB: 04-24-1995, 26 y.o.   MRN: [id number]   Sharon Hess, is a 26 y.o. female  [id number]  [id number]  DOB - [birth year]  Chief Complaint  Patient presents with   Amenorrhea       Subjective:   Sharon Hess is a 26 y.o. female here today for spotting after having nexplanon removed about 1 month ago.  She is using condoms for [facility].  No heavy bleeding.  Also wanting to get tested for PCOS.  She has coarse hair and some growth around mouth.  She had an A1C=5.7 this year.    No problems updated.  ALLERGIES: No Known Allergies  PAST MEDICAL HISTORY: Past Medical History:  Diagnosis Date   Anemia    Fatty liver    Hepatitis B    Preeclampsia 2021   Previous cesarean delivery, antepartum condition or complication 9/93/7169   OP arrest at 7 cm--desires TOLAC--consent signed     MEDICATIONS AT HOME: Prior to Admission medications   Medication Sig Start Date End Date Taking? Authorizing Provider  carbamide peroxide (DEBROX) 6.5 % OTIC solution Place 5 drops into both ears 2 (two) times daily. 07/07/21  Yes Elsie Stain, MD  Vitamin D, Ergocalciferol, (DRISDOL) 1.25 MG (50000 UNIT) CAPS capsule Take 1 capsule (50,000 Units total) by mouth once every 7 (seven) days. 07/07/21  Yes Elsie Stain, MD    ROS: Neg HEENT Neg resp Neg cardiac Neg GI Neg GU Neg MS Neg psych Neg neuro  Objective:   Vitals:   10/13/21 1023  BP: 107/75  Pulse: 86  Resp: 16  Temp: 98.1 F (36.7 C)  TempSrc: Oral  SpO2: 96%  Weight: 245 lb 9.6 oz (111.4 kg)   Exam General appearance : Awake, alert, not in any distress. Speech Clear. Not toxic looking HEENT: Atraumatic and Normocephalic Neck: Supple, no JVD. No cervical lymphadenopathy.  Chest: Good air entry bilaterally, CTAB.  No rales/rhonchi/wheezing CVS: S1 S2 regular, no murmurs.  Extremities: B/L Lower Ext shows no edema, both legs are warm to touch Neurology:  Awake alert, and oriented X 3, CN II-XII intact, Non focal Skin: No Rash  Data Review Lab Results  Component Value Date   HGBA1C 5.7 (H) 03/15/2021   HGBA1C 5.1 02/12/2019   HGBA1C 5.4 12/01/2014    Assessment & Plan   1. Hirsutism PCOS habitus-will need fasting labs and likely pelvic U/S - Ambulatory referral to Gynecology  2. Spotting Normal after nexplanon removal.  Using condoms.  No heavy bleeding.   - Ambulatory referral to Gynecology  3. Language barrier [employer] "[employer]" interpreters used and additional time performing visit was required    Return if symptoms worsen or fail to improve.  The patient was given clear instructions to go to ER or return to medical center if symptoms don't improve, worsen or new problems develop. The patient verbalized understanding. The patient was told to call to get lab results if they haven't heard anything in the next week.      Freeman Caldron, PA-C [facility] and [facility] [place], [place]   10/13/2021, 10:51 AM

## 2021-11-04 ENCOUNTER — Ambulatory Visit: Payer: Self-pay | Admitting: Nurse Practitioner

## 2021-11-10 ENCOUNTER — Ambulatory Visit: Payer: Self-pay | Admitting: Physician Assistant

## 2021-11-10 ENCOUNTER — Ambulatory Visit: Payer: Self-pay | Admitting: Critical Care Medicine

## 2021-12-22 ENCOUNTER — Encounter: Payer: Self-pay | Admitting: Family Medicine

## 2021-12-22 ENCOUNTER — Other Ambulatory Visit: Payer: Self-pay

## 2021-12-22 ENCOUNTER — Ambulatory Visit: Payer: Self-pay | Attending: Critical Care Medicine | Admitting: Family Medicine

## 2021-12-22 VITALS — BP 111/77 | HR 79 | Temp 98.5°F | Ht 61.0 in | Wt 246.0 lb

## 2021-12-22 DIAGNOSIS — N926 Irregular menstruation, unspecified: Secondary | ICD-10-CM

## 2021-12-22 DIAGNOSIS — L219 Seborrheic dermatitis, unspecified: Secondary | ICD-10-CM

## 2021-12-22 DIAGNOSIS — E6609 Other obesity due to excess calories: Secondary | ICD-10-CM

## 2021-12-22 DIAGNOSIS — Z6841 Body Mass Index (BMI) 40.0 and over, adult: Secondary | ICD-10-CM

## 2021-12-22 DIAGNOSIS — Z23 Encounter for immunization: Secondary | ICD-10-CM

## 2021-12-22 MED ORDER — SELENIUM SULFIDE 2.25 % EX SHAM
MEDICATED_SHAMPOO | CUTANEOUS | 1 refills | Status: DC
  Filled 2021-12-22: qty 180, 30d supply, fill #0

## 2021-12-22 NOTE — Progress Notes (Signed)
Subjective:  Patient ID: Sharon Hess, female    DOB: October 17, 1995  Age: 26 y.o. MRN: [id number]  CC: No chief complaint on file.   HPI Sharon Hess is a 26 y.o. year old female with a history of Obesity seen for an office visit  Interval History:  She Complains since she took out her Nexplanon 6 months ago her periods have not been normal and she had Nexplanon in for 2 years. Bleedings has varied 1-4 days and has been irregular. LMP was on 12/12/21. Since she was 13 she has not had regular periods between the birth of 3 kids and Depo and Nexplanon contraceptions. She would like a Prescription for dandruff which she has in her scalp.  Past Medical History:  Diagnosis Date   Anemia    Fatty liver    Hepatitis B    Preeclampsia 2021   Previous cesarean delivery, antepartum condition or complication 02/16/2014   OP arrest at 7 cm--desires TOLAC--consent signed     Past Surgical History:  Procedure Laterality Date   ABDOMINAL SURGERY     c-section   CESAREAN SECTION     CESAREAN SECTION  08/21/2011   Procedure: CESAREAN SECTION;  Surgeon: Allie Bossier, MD;  Location: [facility];  Service: Gynecology;  Laterality: N/A;  Primary cesarean section of baby girl  at 71  APGAR 9/9    Family History  Problem Relation Age of Onset   Alcohol abuse Neg Hx    Arthritis Neg Hx    Asthma Neg Hx    Birth defects Neg Hx    Cancer Neg Hx    COPD Neg Hx    Depression Neg Hx    Diabetes Neg Hx    Drug abuse Neg Hx    Early death Neg Hx    Hearing loss Neg Hx    Heart disease Neg Hx    Hyperlipidemia Neg Hx    Hypertension Neg Hx    Kidney disease Neg Hx    Learning disabilities Neg Hx    Mental illness Neg Hx    Mental retardation Neg Hx    Miscarriages / Stillbirths Neg Hx    Stroke Neg Hx    Vision loss Neg Hx     Social History   Socioeconomic History   Marital status: Significant Other    Spouse name: Not on file   Number of children: Not on file    Years of education: Not on file   Highest education level: Not on file  Occupational History   Not on file  Tobacco Use   Smoking status: Never   Smokeless tobacco: Never  Vaping Use   Vaping Use: Never used  Substance and Sexual Activity   Alcohol use: No   Drug use: No   Sexual activity: Yes    Birth control/protection: None  Other Topics Concern   Not on file  Social History Narrative   ** Merged History Encounter **       Social Determinants of Health   Financial Resource Strain: Not on file  Food Insecurity: No Food Insecurity (07/07/2019)   Hunger Vital Sign    Worried About Running Out of Food in the Last Year: Never true    Ran Out of Food in the Last Year: Never true  Transportation Needs: No Transportation Needs (07/07/2019)   PRAPARE - Administrator, Civil Service (Medical): No    Lack of Transportation (Non-Medical): No  Recent  Concern: Transportation Needs - Unmet Transportation Needs (05/12/2019)   PRAPARE - Administrator, Civil Service (Medical): Yes    Lack of Transportation (Non-Medical): Yes  Physical Activity: Not on file  Stress: Not on file  Social Connections: Not on file    No Known Allergies  Outpatient Medications Prior to Visit  Medication Sig Dispense Refill   carbamide peroxide (DEBROX) 6.5 % OTIC solution Place 5 drops into both ears 2 (two) times daily. (Patient not taking: Reported on 12/22/2021) 15 mL 0   Vitamin D, Ergocalciferol, (DRISDOL) 1.25 MG (50000 UNIT) CAPS capsule Take 1 capsule (50,000 Units total) by mouth once every 7 (seven) days. (Patient not taking: Reported on 12/22/2021) 5 capsule 5   No facility-administered medications prior to visit.     ROS Review of Systems  Constitutional:  Negative for activity change and appetite change.  HENT:  Negative for sinus pressure and sore throat.   Respiratory:  Negative for chest tightness, shortness of breath and wheezing.   Cardiovascular:  Negative for  chest pain and palpitations.  Gastrointestinal:  Negative for abdominal distention, abdominal pain and constipation.  Genitourinary:  Positive for menstrual problem.  Musculoskeletal: Negative.   Psychiatric/Behavioral:  Negative for behavioral problems and dysphoric mood.     Objective:  BP 111/77   Pulse 79   Temp 98.5 F (36.9 C) (Oral)   Ht 5\' 1"  (1.549 m)   Wt 246 lb (111.6 kg)   SpO2 99%   BMI 46.48 kg/m      12/22/2021    9:45 AM 10/13/2021   10:23 AM 07/07/2021    9:49 AM  BP/Weight  Systolic BP 111 107 126  Diastolic BP 77 75 79  Wt. (Lbs) 246 245.6 244  BMI 46.48 kg/m2 44.92 kg/m2 44.63 kg/m2      Physical Exam Constitutional:      Appearance: She is well-developed. She is obese.  Cardiovascular:     Rate and Rhythm: Normal rate.     Heart sounds: Normal heart sounds. No murmur heard. Pulmonary:     Effort: Pulmonary effort is normal.     Breath sounds: Normal breath sounds. No wheezing or rales.  Chest:     Chest wall: No tenderness.  Abdominal:     General: Bowel sounds are normal. There is no distension.     Palpations: Abdomen is soft. There is no mass.     Tenderness: There is no abdominal tenderness.  Musculoskeletal:        General: Normal range of motion.     Right lower leg: No edema.     Left lower leg: No edema.  Neurological:     Mental Status: She is alert and oriented to person, place, and time.  Psychiatric:        Mood and Affect: Mood normal.        Latest Ref Rng & Units 03/15/2021   10:27 AM 12/22/2020   11:55 PM 05/12/2019    9:02 AM  CMP  Glucose 70 - 99 mg/dL 97  05/14/2019  82   BUN 6 - 20 mg/dL 8  13  6    Creatinine 0.57 - 1.00 mg/dL 643   3.29   Sodium 134 - 144 mmol/L 138  140  137   Potassium 3.5 - 5.2 mmol/L 4.4  3.8  3.8   Chloride 96 - 106 mmol/L 104  107  104   CO2 20 - 29 mmol/L 22  24  17  Calcium 8.7 - 10.2 mg/dL 9.1  9.8  9.2   Total Protein 6.0 - 8.5 g/dL 6.9  7.7  5.9   Total Bilirubin 0.0 - 1.2 mg/dL  0.9  1.2  0.7   Alkaline Phos 44 - 121 IU/L 70  72  78   AST 0 - 40 IU/L 17  14  10    ALT 0 - 32 IU/L 30  23  10      Lipid Panel     Component Value Date/Time   CHOL 126 03/15/2021 1027   TRIG 105 03/15/2021 1027   HDL 34 (L) 03/15/2021 1027   CHOLHDL 3.7 03/15/2021 1027   CHOLHDL 4.0 07/09/2014 1546   VLDL 49 (H) 07/09/2014 1546   LDLCALC 72 03/15/2021 1027    CBC    Component Value Date/Time   WBC 8.4 12/22/2020 2355   RBC 4.69 12/22/2020 2355   HGB 14.0 12/22/2020 2355   HGB 13.1 09/04/2019 1622   HCT 42.2 12/22/2020 2355   HCT 40.3 09/04/2019 1622   PLT 289 12/22/2020 2355   PLT 324 09/04/2019 1622   MCV 90.0 12/22/2020 2355   MCV 87 09/04/2019 1622   MCH 29.9 12/22/2020 2355   MCHC 33.2 12/22/2020 2355   RDW 12.7 12/22/2020 2355   RDW 15.2 09/04/2019 1622   LYMPHSABS 3.1 12/22/2020 2355   LYMPHSABS 2.2 02/12/2019 1517   MONOABS 0.4 12/22/2020 2355   EOSABS 0.1 12/22/2020 2355   EOSABS 0.7 (H) 02/12/2019 1517   BASOSABS 0.0 12/22/2020 2355   BASOSABS 0.0 02/12/2019 1517    Lab Results  Component Value Date   HGBA1C 5.7 (H) 03/15/2021    Assessment & Plan:  1. Irregular menstruation Advised that it could take up to 12 months for cycles to return to normal post removal of Nexplanon and she is currently 6 months post removal It also appears that she never had a regular cycle prior to placement of Nexplanon due to repeated use of injectables and Nexplanon along with her childbirth I reviewed her last office visit with the physician assistant where she was also referred to GYN for possible PCOS symptoms as that could explain irregular menstruation as well - Basic Metabolic Panel - Estradiol - FSH/LH - CBC with Differential/Platelet  2. Seborrheic dermatitis - Selenium Sulfide 2.25 % SHAM; Apply to scalp twice a week  Dispense: 180 mL; Refill: 1  3. Flu vaccine need - Flu Vaccine MDCK QUAD PF   Meds ordered this encounter  Medications   Selenium  Sulfide 2.25 % SHAM    Sig: Apply to scalp twice a week    Dispense:  180 mL    Refill:  1    Follow-up: Return if symptoms worsen or fail to improve.       02/14/2019, MD, FAAFP. [facility] [place], [facility] [place]   12/22/2021, 12:43 PM

## 2021-12-22 NOTE — Patient Instructions (Signed)
Placed in [facility]  [address]  [place], [place] [address] [phone]

## 2021-12-22 NOTE — Progress Notes (Signed)
No period since removal of Nexplanon but has had some off and on bleeding.

## 2021-12-23 ENCOUNTER — Other Ambulatory Visit: Payer: Self-pay

## 2021-12-23 LAB — CBC WITH DIFFERENTIAL/PLATELET
Basophils Absolute: 0 10*3/uL (ref 0.0–0.2)
Basos: 0 %
EOS (ABSOLUTE): 0.1 10*3/uL (ref 0.0–0.4)
Eos: 2 %
Hematocrit: 41.7 % (ref 34.0–46.6)
Hemoglobin: 13.9 g/dL (ref 11.1–15.9)
Immature Grans (Abs): 0 10*3/uL (ref 0.0–0.1)
Immature Granulocytes: 0 %
Lymphocytes Absolute: 2.4 10*3/uL (ref 0.7–3.1)
Lymphs: 35 %
MCH: 29.7 pg (ref 26.6–33.0)
MCHC: 33.3 g/dL (ref 31.5–35.7)
MCV: 89 fL (ref 79–97)
Monocytes Absolute: 0.4 10*3/uL (ref 0.1–0.9)
Monocytes: 6 %
Neutrophils Absolute: 3.9 10*3/uL (ref 1.4–7.0)
Neutrophils: 57 %
Platelets: 266 10*3/uL (ref 150–450)
RBC: 4.68 x10E6/uL (ref 3.77–5.28)
RDW: 12.9 % (ref 11.7–15.4)
WBC: 7 10*3/uL (ref 3.4–10.8)

## 2021-12-23 LAB — BASIC METABOLIC PANEL
BUN/Creatinine Ratio: 21 (ref 9–23)
BUN: 12 mg/dL (ref 6–20)
CO2: 20 mmol/L (ref 20–29)
Calcium: 9.6 mg/dL (ref 8.7–10.2)
Chloride: 103 mmol/L (ref 96–106)
Creatinine, Ser: 0.58 mg/dL (ref 0.57–1.00)
Glucose: 100 mg/dL — ABNORMAL HIGH (ref 70–99)
Potassium: 4.2 mmol/L (ref 3.5–5.2)
Sodium: 139 mmol/L (ref 134–144)
eGFR: 128 mL/min/{1.73_m2} (ref >59)

## 2021-12-23 LAB — ESTRADIOL: Estradiol: 31.1 pg/mL

## 2021-12-23 LAB — FSH/LH
FSH: 6.1 m[IU]/mL
LH: 12.1 m[IU]/mL

## 2021-12-28 ENCOUNTER — Other Ambulatory Visit: Payer: Self-pay

## 2022-01-02 ENCOUNTER — Other Ambulatory Visit: Payer: Self-pay

## 2022-01-05 ENCOUNTER — Other Ambulatory Visit: Payer: Self-pay

## 2022-03-01 ENCOUNTER — Encounter: Payer: Self-pay | Admitting: Obstetrics & Gynecology

## 2022-03-01 ENCOUNTER — Other Ambulatory Visit: Payer: Self-pay

## 2022-03-01 ENCOUNTER — Ambulatory Visit (INDEPENDENT_AMBULATORY_CARE_PROVIDER_SITE_OTHER): Payer: Self-pay | Admitting: Obstetrics & Gynecology

## 2022-03-01 VITALS — BP 115/78 | HR 75 | Wt 246.9 lb

## 2022-03-01 DIAGNOSIS — L680 Hirsutism: Secondary | ICD-10-CM

## 2022-03-01 DIAGNOSIS — Z789 Other specified health status: Secondary | ICD-10-CM

## 2022-03-01 DIAGNOSIS — K760 Fatty (change of) liver, not elsewhere classified: Secondary | ICD-10-CM

## 2022-03-01 DIAGNOSIS — Z6841 Body Mass Index (BMI) 40.0 and over, adult: Secondary | ICD-10-CM

## 2022-03-01 DIAGNOSIS — Z98891 History of uterine scar from previous surgery: Secondary | ICD-10-CM

## 2022-03-01 DIAGNOSIS — E88819 Insulin resistance, unspecified: Secondary | ICD-10-CM

## 2022-03-01 LAB — POCT PREGNANCY, URINE: Preg Test, Ur: NEGATIVE

## 2022-03-01 MED ORDER — NORGESTIMATE-ETH ESTRADIOL 0.25-35 MG-MCG PO TABS: 1.0000 | ORAL_TABLET | Freq: Every day | ORAL | 11 refills | Status: DC

## 2022-03-01 NOTE — Progress Notes (Signed)
Patient ID: Sharon Hess, female   DOB: Mar 23, 1995, 27 y.o.   MRN: [id number]  Chief Complaint  Patient presents with   Hirsutism  Amenorrhea and pelvic pain  HPI Sharon Hess is a 27 y.o. female.  [id number] Amenorrhea for years when not pregnant. She had Nexplanon removed last summer due to weight gain and abdominal-pelvic pain. S/P 1 cesarean section and 2 VBAC. The last delivery was 3 years ago. She uses condoms for [facility] now. No future childbearing desired. HPI  Past Medical History:  Diagnosis Date   Anemia    Fatty liver    Hepatitis B    Preeclampsia 2021   Previous cesarean delivery, antepartum condition or complication 0/16/0109   OP arrest at 7 cm--desires TOLAC--consent signed     Past Surgical History:  Procedure Laterality Date   ABDOMINAL SURGERY     c-section   CESAREAN SECTION     CESAREAN SECTION  08/21/2011   Procedure: CESAREAN SECTION;  Surgeon: Emily Filbert, MD;  Location: [place] [facility];  Service: Gynecology;  Laterality: N/A;  Primary cesarean section of baby girl  at 33  APGAR 9/9    Family History  Problem Relation Age of Onset   Alcohol abuse Neg Hx    Arthritis Neg Hx    Asthma Neg Hx    Birth defects Neg Hx    Cancer Neg Hx    COPD Neg Hx    Depression Neg Hx    Diabetes Neg Hx    Drug abuse Neg Hx    Early death Neg Hx    Hearing loss Neg Hx    Heart disease Neg Hx    Hyperlipidemia Neg Hx    Hypertension Neg Hx    Kidney disease Neg Hx    Learning disabilities Neg Hx    Mental illness Neg Hx    Mental retardation Neg Hx    Miscarriages / Stillbirths Neg Hx    Stroke Neg Hx    Vision loss Neg Hx     Social History Social History   Tobacco Use   Smoking status: Never   Smokeless tobacco: Never  Vaping Use   Vaping Use: Never used  Substance Use Topics   Alcohol use: No   Drug use: No    No Known Allergies  Current Outpatient Medications  Medication Sig Dispense Refill   norgestimate-ethinyl estradiol  (ORTHO-CYCLEN) 0.25-35 MG-MCG tablet Take 1 tablet by mouth daily. 28 tablet 11   VITAMIN D PO Take 1 Dose by mouth daily.     No current facility-administered medications for this visit.    Review of Systems Review of Systems  Constitutional:  Positive for unexpected weight change.  Endocrine:       Increased hair under chin  Genitourinary:  Positive for dyspareunia and pelvic pain. Negative for urgency, vaginal bleeding and vaginal discharge.  Skin:  Positive for color change (darkening around neck).    Blood pressure 115/78, pulse 75, weight 246 lb 14.4 oz (112 kg).  Physical Exam Physical Exam Vitals and nursing note reviewed.  Constitutional:      Appearance: She is obese. She is not ill-appearing.  Cardiovascular:     Rate and Rhythm: Normal rate.  Pulmonary:     Effort: Pulmonary effort is normal.  Skin:    General: Skin is warm and dry.     Comments: Darkening around neck c/w acanthosis nigricans   Neurological:     Mental Status: She is alert.  Psychiatric:        Mood and Affect: Mood normal.        Behavior: Behavior normal.     Data Reviewed Pap, A1c  Assessment Language barrier  History of cesarean section  Fatty liver disease, nonalcoholic  Morbid obesity with BMI of 40.0-44.9, adult (HCC)  Female hirsutism - Plan: norgestimate-ethinyl estradiol (ORTHO-CYCLEN) 0.25-35 MG-MCG tablet, FSH/LH, TestT+TestF+SHBG, DHEA-sulfate, Estrogens, Total, HgB A1c, Comp Met (CMET), US PELVIC COMPLETE WITH TRANSVAGINAL  Insulin resistance   Plan Orders Placed This Encounter  Procedures   US PELVIC COMPLETE WITH TRANSVAGINAL    Standing Status:   Future    Standing Expiration Date:   08/30/2022    Order Specific Question:   Reason for Exam (SYMPTOM  OR DIAGNOSIS REQUIRED)    Answer:   pelvic pain amenorrhea    Order Specific Question:   Preferred imaging location?    Answer:   [facility]   FSH/LH    Day 3 labs    Order Specific Question:   Release to  patient    Answer:   Immediate   TestT+TestF+SHBG   DHEA-sulfate   Estrogens, Total   HgB A1c   Comp Met (CMET)   Pregnancy, urine POC   RTC in 6 weeks Start OCP for cycle control    Emeterio Reeve 03/01/2022, 10:38 AM

## 2022-03-01 NOTE — Progress Notes (Signed)
5 months ago noticed darkening of skin around neck and under arms. 3 months ago started vaginal spotting and hair growing under her chin. New chest pain in last 2 weeks slightly left of center with shortness of breath; happens twice a week.   Last normal period was at 27 yo prior to first pregnancy. Depo Provera used intermittently for birth control until changing to [place] following last pregnancy July 2021. Nexplanon removed 8/23 due to weight gain.  Apolonio Schneiders RN 03/01/22

## 2022-03-09 ENCOUNTER — Ambulatory Visit
Admission: RE | Admit: 2022-03-09 | Discharge: 2022-03-09 | Disposition: A | Discharge status: Home / Self Care | Payer: Self-pay | Source: Ambulatory Visit | Attending: Obstetrics & Gynecology | Admitting: Obstetrics & Gynecology

## 2022-03-09 DIAGNOSIS — L680 Hirsutism: Secondary | ICD-10-CM | POA: Insufficient documentation

## 2022-03-09 DIAGNOSIS — E282 Polycystic ovarian syndrome: Secondary | ICD-10-CM

## 2022-03-10 LAB — TESTT+TESTF+SHBG
Sex Hormone Binding: 25.1 nmol/L (ref 24.6–122.0)
Testosterone, Free: 3.9 pg/mL (ref 0.0–4.2)
Testosterone, Total, LC/MS: 59 ng/dL — ABNORMAL HIGH (ref 10.0–55.0)

## 2022-03-10 LAB — COMPREHENSIVE METABOLIC PANEL
ALT: 31 IU/L (ref 0–32)
AST: 17 IU/L (ref 0–40)
Albumin/Globulin Ratio: 1.8 (ref 1.2–2.2)
Albumin: 4.5 g/dL (ref 4.0–5.0)
Alkaline Phosphatase: 67 IU/L (ref 44–121)
BUN/Creatinine Ratio: 20 (ref 9–23)
BUN: 11 mg/dL (ref 6–20)
Bilirubin Total: 0.9 mg/dL (ref 0.0–1.2)
CO2: 22 mmol/L (ref 20–29)
Calcium: 9.3 mg/dL (ref 8.7–10.2)
Chloride: 103 mmol/L (ref 96–106)
Creatinine, Ser: 0.54 mg/dL — ABNORMAL LOW (ref 0.57–1.00)
Globulin, Total: 2.5 g/dL (ref 1.5–4.5)
Glucose: 105 mg/dL — ABNORMAL HIGH (ref 70–99)
Potassium: 4.4 mmol/L (ref 3.5–5.2)
Sodium: 139 mmol/L (ref 134–144)
Total Protein: 7 g/dL (ref 6.0–8.5)
eGFR: 130 mL/min/{1.73_m2} (ref >59)

## 2022-03-10 LAB — FSH/LH
FSH: 6 m[IU]/mL
LH: 12 m[IU]/mL

## 2022-03-10 LAB — HEMOGLOBIN A1C
Est. average glucose Bld gHb Est-mCnc: 117 mg/dL
Hgb A1c MFr Bld: 5.7 % — ABNORMAL HIGH (ref 4.8–5.6)

## 2022-03-10 LAB — DHEA-SULFATE: DHEA-SO4: 275 ug/dL (ref 84.8–378.0)

## 2022-03-10 LAB — ESTROGENS, TOTAL: Estrogen: 138 pg/mL

## 2022-03-22 DIAGNOSIS — E282 Polycystic ovarian syndrome: Secondary | ICD-10-CM | POA: Insufficient documentation

## 2022-04-12 ENCOUNTER — Other Ambulatory Visit: Payer: Self-pay

## 2022-04-12 ENCOUNTER — Ambulatory Visit (INDEPENDENT_AMBULATORY_CARE_PROVIDER_SITE_OTHER): Payer: Self-pay | Admitting: Obstetrics & Gynecology

## 2022-04-12 ENCOUNTER — Encounter: Payer: Self-pay | Admitting: Obstetrics & Gynecology

## 2022-04-12 VITALS — BP 112/78 | HR 85 | Ht 61.0 in | Wt 244.0 lb

## 2022-04-12 DIAGNOSIS — E282 Polycystic ovarian syndrome: Secondary | ICD-10-CM

## 2022-04-12 DIAGNOSIS — Z789 Other specified health status: Secondary | ICD-10-CM

## 2022-04-12 DIAGNOSIS — L680 Hirsutism: Secondary | ICD-10-CM

## 2022-04-12 MED ORDER — NORGESTIMATE-ETH ESTRADIOL 0.25-35 MG-MCG PO TABS: 1.0000 | ORAL_TABLET | Freq: Every day | ORAL | 11 refills | Status: DC

## 2022-04-12 NOTE — Progress Notes (Signed)
Patient ID: Sharon Hess, female   DOB: Feb 08, 1995, 27 y.o.   MRN: [id number]  Chief Complaint  Patient presents with   Menstrual Problem    Follow up on medication managment    HPI Sharon Hess is a 27 y.o. female.  [id number] Patient's last menstrual period was 03/26/2022. She was evaluated for PCOS with US showing PCOS and mildly elevated testosterone o/w normal labs.She was unable to get refill after taking the first pack of OCP but she wants to continue for cycle control. HPI  Past Medical History:  Diagnosis Date   Anemia    Fatty liver    Hepatitis B    Preeclampsia 2021   Previous cesarean delivery, antepartum condition or complication [id number]   OP arrest at 7 cm--desires TOLAC--consent signed     Past Surgical History:  Procedure Laterality Date   ABDOMINAL SURGERY     c-section   CESAREAN SECTION     CESAREAN SECTION  08/21/2011   Procedure: CESAREAN SECTION;  Surgeon: Emily Filbert, MD;  Location: [place] [facility];  Service: Gynecology;  Laterality: N/A;  Primary cesarean section of baby girl  at 101  APGAR 9/9    Family History  Problem Relation Age of Onset   Alcohol abuse Neg Hx    Arthritis Neg Hx    Asthma Neg Hx    Birth defects Neg Hx    Cancer Neg Hx    COPD Neg Hx    Depression Neg Hx    Diabetes Neg Hx    Drug abuse Neg Hx    Early death Neg Hx    Hearing loss Neg Hx    Heart disease Neg Hx    Hyperlipidemia Neg Hx    Hypertension Neg Hx    Kidney disease Neg Hx    Learning disabilities Neg Hx    Mental illness Neg Hx    Mental retardation Neg Hx    Miscarriages / Stillbirths Neg Hx    Stroke Neg Hx    Vision loss Neg Hx     Social History Social History   Tobacco Use   Smoking status: Never   Smokeless tobacco: Never  Vaping Use   Vaping Use: Never used  Substance Use Topics   Alcohol use: No   Drug use: No    No Known Allergies  Current Outpatient Medications  Medication Sig Dispense Refill   VITAMIN D PO Take  1 Dose by mouth daily.     norgestimate-ethinyl estradiol (ORTHO-CYCLEN) 0.25-35 MG-MCG tablet Take 1 tablet by mouth daily. 28 tablet 11   No current facility-administered medications for this visit.    Review of Systems Review of Systems  Blood pressure 112/78, pulse 85, height 5\' 1"  (1.549 m), weight 244 lb (110.7 kg), last menstrual period 03/26/2022.  Physical Exam Physical Exam Vitals and nursing note reviewed.  Constitutional:      Appearance: Normal appearance.  Neurological:     Mental Status: She is alert.  Psychiatric:        Mood and Affect: Mood normal.        Behavior: Behavior normal.     Data Reviewed Narrative & Impression  CLINICAL DATA:  pelvic pain amenorrhea   EXAM: ULTRASOUND OF PELVIS   TECHNIQUE: Transabdominal and transvaginalultrasound examination of the pelvis was performed including evaluation of the uterus, ovaries, adnexal regions, and pelvic cul-de-sac.   COMPARISON:  05/25/2016.   FINDINGS: Uterusanteverted, 7 x 5 x 4 cm. The endometrium unremarkable,  9 mm. The uterine cavity is empty. There are no uterine masses.   Right ovary   Numerous immature peripheral follicles. Ovary measured 3.1 x 2.6 x 2.0 cm.   Left ovary   Numerous small peripheral follicles. Ovary measured 4.0 x 2.5 x 2.3 cm.   Images of the adnexae demonstrated no masses or fluid collections. Ovaries demonstrate blood flow with Doppler.   IMPRESSION: Findings suggest polycystic ovary syndrome.  Otherwise unremarkable.     Electronically Signed   By: Sammie Bench M.D.   On: 03/09/2022 11:35    Assessment Language barrier  Female hirsutism - Plan: norgestimate-ethinyl estradiol (ORTHO-CYCLEN) 0.25-35 MG-MCG tablet  PCOS (polycystic ovarian syndrome)   Plan RTC 6 months    Emeterio Reeve 04/12/2022, 3:23 PM

## 2022-08-17 ENCOUNTER — Encounter (HOSPITAL_COMMUNITY): Payer: Self-pay

## 2022-08-17 ENCOUNTER — Other Ambulatory Visit: Payer: Self-pay

## 2022-08-17 ENCOUNTER — Emergency Department (HOSPITAL_COMMUNITY)
Admission: EM | Admit: 2022-08-17 | Discharge: 2022-08-18 | Disposition: A | Discharge status: Home / Self Care | Payer: Self-pay | Attending: Student | Admitting: Student

## 2022-08-17 DIAGNOSIS — H60503 Unspecified acute noninfective otitis externa, bilateral: Secondary | ICD-10-CM | POA: Insufficient documentation

## 2022-08-17 NOTE — ED Triage Notes (Signed)
Spanish interpreter used for triage.   Bilateral ear pain x 5 days but worst on the right side.

## 2022-08-18 ENCOUNTER — Telehealth: Payer: Self-pay | Admitting: Critical Care Medicine

## 2022-08-18 MED ORDER — CIPROFLOXACIN-DEXAMETHASONE 0.3-0.1 % OT SUSP
4.0000 [drp] | Freq: Two times a day (BID) | OTIC | Status: AC
  Administered 2022-08-18: 4 [drp] via OTIC
  Filled 2022-08-18: qty 7.5

## 2022-08-18 NOTE — ED Provider Notes (Signed)
[place] EMERGENCY DEPARTMENT AT [facility] Provider Note   CSN: [id number] Arrival date & time: 08/17/22  2218     History  Chief Complaint  Patient presents with   Ear Pain    Sharon Hess is a 27 y.o. female.  Pt presents to the ED today with chief complaint of 5 day history of bilateral ear pain that is worse on the left side. She also has some hearing loss on the left side and a sore throat that is worse when she swallows. Denies fever, cough, headache, vomiting or diarrhea.   The history is provided by the patient. A language interpreter was used.      Home Medications Prior to Admission medications   Medication Sig Start Date End Date Taking? Authorizing Provider  norgestimate-ethinyl estradiol (ORTHO-CYCLEN) 0.25-35 MG-MCG tablet Take 1 tablet by mouth daily. 04/12/22   Adam Phenix, MD  VITAMIN D PO Take 1 Dose by mouth daily.    [provider]      Allergies    Patient has no known allergies.    Review of Systems   Review of Systems Ten systems reviewed and are negative for acute change, except as noted in the HPI.    Physical Exam Updated Vital Signs BP 126/77 (BP Location: Right Arm)   Pulse 100   Temp 98.3 F (36.8 C) (Oral)   Resp 18   Ht 5\' 1"  (1.549 m)   Wt 106.6 kg   LMP  (LMP Unknown)   SpO2 97%   BMI 44.40 kg/m   Physical Exam Vitals and nursing note reviewed.  Constitutional:      General: She is not in acute distress.    Appearance: She is well-developed. She is not diaphoretic.     Comments: Obese, nontoxic appearing female  HENT:     Head: Normocephalic and atraumatic.     Ears:     Comments: TTP to b/l tragus. There is swelling of ear canals b/l; left with near occlusion of the canal from edema. No active drainage. No swelling of the outer ear. No mastoid swelling, erythema, TTP.    Mouth/Throat:     Comments: Clear oropharynx. Uvula midline with normal phonation. Tolerating secretions w/o  difficulty. Eyes:     General: No scleral icterus.    Extraocular Movements: EOM normal.     Conjunctiva/sclera: Conjunctivae normal.  Neck:     Comments: No meningismus Pulmonary:     Effort: Pulmonary effort is normal. No respiratory distress.     Comments: Respirations even and unlabored Musculoskeletal:        General: Normal range of motion.     Cervical back: Normal range of motion.  Skin:    General: Skin is warm and dry.     Coloration: Skin is not pale.     Findings: No erythema or rash.  Neurological:     Mental Status: She is alert and oriented to person, place, and time.     Coordination: Coordination normal.  Psychiatric:        Mood and Affect: Mood and affect normal.        Behavior: Behavior normal.     ED Results / Procedures / Treatments   Labs (all labs ordered are listed, but only abnormal results are displayed) Labs Reviewed - No data to display  EKG None  Radiology No results found.  Procedures Procedures    Medications Ordered in ED Medications  ciprofloxacin-dexamethasone (CIPRODEX) 0.3-0.1 %  OTIC (EAR) suspension 4 drop (4 drops Both EARS Given 08/18/22 0200)    ED Course/ Medical Decision Making/ A&P                             Medical Decision Making Risk Prescription drug management.   This patient presents to the ED for concern of ear pain, this involves an extensive number of treatment options, and is a complaint that carries with it a high risk of complications and morbidity.  The differential diagnosis includes AOE vs AOM vs mastoiditis vs TMJ dysfunction vs dental infection   Co morbidities that complicate the patient evaluation  Anemia  Fatty liver   Cardiac Monitoring:  The patient was maintained on a cardiac monitor.  I personally viewed and interpreted the cardiac monitored which showed an underlying rhythm of: NSR   Medicines ordered and prescription drug management:  I ordered medication including Ciprodex  drops for otitis externa  Reevaluation of the patient after these medicines showed that the patient  remained stable I have reviewed the patients home medicines and have made adjustments as needed   Test Considered:  CT temporal bones - however no clinical concern for malignant OE or mastoiditis on exam.   Problem List / ED Course:  Pt presenting with ear pain; exam c/w otitis externa.  Pt afebrile in NAD. Exam non concerning for mastoiditis, cellulitis or malignant OE.  Ear wick placed in ED. Discharge with Ciprodex drops.  Advised PCP follow up in 2-3 days if no improvement with treatment or no complete resolution by 7 days.    Reevaluation:  After the interventions noted above, I reevaluated the patient and found that they have :stayed the same   Social Determinants of Health:  Language barrier   Dispostion:  After consideration of the diagnostic results and the patients response to treatment, I feel that the patent would benefit from Ciprodex drops BID x 1 week. Encouraged PCP f/u for recheck. Return precautions discussed and provided. Patient discharged in stable condition with no unaddressed concerns.          Final Clinical Impression(s) / ED Diagnoses Final diagnoses:  Acute otitis externa of both ears, unspecified type    Rx / DC Orders ED Discharge Orders     None         Antony Madura, PA-C 08/18/22 0247    Glendora Score, MD 08/18/22 [phone]

## 2022-08-18 NOTE — Telephone Encounter (Signed)
Call placed to patient unable to reach message left on VM.   

## 2022-08-18 NOTE — Discharge Instructions (Addendum)
Put 4 drops of Ciprodex in each ear every 12 hours for 10 days. Return for removal of your ear wick in 1 week if they remain in place. Take ibuprofen or Aleve for pain control.  Coloque 4 gotas de Ciprodex en cada odo cada 12 horas durante 10 das. Regrese para quitarle la mecha de la oreja en 1 semana si Print production planner. Tome ibuprofeno o Aleve para Human resources officer.

## 2022-08-18 NOTE — Telephone Encounter (Signed)
Patient is calling to schedule hospital follow up within 7 days. Pt reports that she is continuing to use eye drops. Reporting Pain Still 10/10 No available Appts CB- [phone]

## 2022-08-21 NOTE — Telephone Encounter (Signed)
Spoke with patient . Verified name & DOB   Patient voiced that her ear is feeling much better. The ear drops have helped and her pain is now a 3/10. Scheduled appointment with PCP on August 14 th. Advised if s/s returned and she needed  to see PCP sooner, please call us to see if we had an earlier appointment.

## 2022-09-06 ENCOUNTER — Ambulatory Visit: Payer: Self-pay | Admitting: Critical Care Medicine

## 2022-09-07 ENCOUNTER — Ambulatory Visit: Payer: Self-pay | Attending: Critical Care Medicine | Admitting: Critical Care Medicine

## 2022-09-07 ENCOUNTER — Encounter: Payer: Self-pay | Admitting: Critical Care Medicine

## 2022-09-07 VITALS — BP 114/73 | HR 94 | Ht 61.0 in | Wt 255.2 lb

## 2022-09-07 DIAGNOSIS — E282 Polycystic ovarian syndrome: Secondary | ICD-10-CM

## 2022-09-07 DIAGNOSIS — H6123 Impacted cerumen, bilateral: Secondary | ICD-10-CM

## 2022-09-07 DIAGNOSIS — L680 Hirsutism: Secondary | ICD-10-CM

## 2022-09-07 DIAGNOSIS — Z6841 Body Mass Index (BMI) 40.0 and over, adult: Secondary | ICD-10-CM

## 2022-09-07 DIAGNOSIS — Z8742 Personal history of other diseases of the female genital tract: Secondary | ICD-10-CM

## 2022-09-07 DIAGNOSIS — N926 Irregular menstruation, unspecified: Secondary | ICD-10-CM

## 2022-09-07 DIAGNOSIS — D500 Iron deficiency anemia secondary to blood loss (chronic): Secondary | ICD-10-CM

## 2022-09-07 MED ORDER — NORGESTIMATE-ETH ESTRADIOL 0.25-35 MG-MCG PO TABS: 1.0000 | ORAL_TABLET | Freq: Every day | ORAL | 11 refills | Status: AC

## 2022-09-07 MED ORDER — TRIAMCINOLONE ACETONIDE 0.1 % EX CREA: 1.0000 | TOPICAL_CREAM | Freq: Two times a day (BID) | CUTANEOUS | 0 refills | Status: DC

## 2022-09-07 NOTE — Progress Notes (Signed)
Established Patient Office Visit  Subjective   Patient ID: Sharon Hess, female    DOB: 12/25/1995  Age: 27 y.o. MRN: [id number]  Chief Complaint  Patient presents with   ED visit follow up       06/2021 Patient presents for 4 month follow up.  This visit was assisted by Spanish video interpreter Estella [phone] Sharon Hess is a 27 year old female with a history of hepatic steatosis and obesity presents today with paresthesia and nipple discharge. Patient seen in February for numbness and tingling. Labs drawn at visit normal however Vitamin D low. Patient states the numbness is worsening and occurs in her hands when she is on the phone or in her feet with prolonged sitting. Nothing seems to improve condition. Currently taking iron and vitamin D over the counter. Denies any trauma.  Patient states she started to experience nipple discharge. She describes the discharge as clear and only occurs at night when she is in bed. Patient is using Nexplanon hormonal implant for birth control. Not currently having a menstrual cycle. Denies fever, pain, or rash.  Additionally she complains of intermittent dizziness after eating and then becomes very anxious. She states she is doing her best at eating healthier food options such as fresh fruit or vegetables. Patient states she is exercising everyday. Denies alcohol or substance abuse.  She was seen in the Emergency Room recently for bilateral otitis media. Infection has cleared however experiencing slight hearing loss. She is concerned for cerumen impaction. Previous rash on face has improved and looks much better.  Will order Follicle Stimulating, Luteinizing Hormone, and Iron studies today. PAP smear up to date (03/2021)   11/2021 newlin  Irregular menstruation Advised that it could take up to 12 months for cycles to return to normal post removal of Nexplanon and she is currently 6 months post removal It also appears that she never  had a regular cycle prior to placement of Nexplanon due to repeated use of injectables and Nexplanon along with her childbirth I reviewed her last office visit with the physician assistant where she was also referred to GYN for possible PCOS symptoms as that could explain irregular menstruation as well - Basic Metabolic Panel - Estradiol; - FSH/LH - CBC with Differential/Platelet   2. Seborrheic dermatitis - Selenium Sulfide 2.25 % SHAM; Apply to scalp twice a week  Dispense: 180 mL; Refill: 1   09/07/22 This patient is seen in return follow-up it is been a considerable period of time since I have seen her since last June 2023.  The patient was seen by my partner Dr. Alvis Lemmings in November at that time she was having an regular menstruation she had had a Nexplanon removed and this did not cause improvement in her cycle.  She was then seen by gynecology and that practice recommended oral medication to improve her menses.  There was a question of polycystic ovaries on ultrasound.  She had at the last visit with Dr. Alvis Lemmings in November normal [facility] estradiol levels.  She also had seborrheic dermatitis and was given shampoo has helped to some degree but she prefers this cream.  She has had some bleeding from her menstrual cycle which last 1 was in May of this year.  Note this visit was assisted by Spanish video interpreter Rossello [phone]     Patient Active Problem List   Diagnosis Date Noted   Irregular menstruation 09/07/2022   PCOS (polycystic ovarian syndrome) 03/22/2022   Iron  deficiency anemia due to chronic blood loss 07/07/2021   Acne 03/15/2021   Morbid obesity with BMI of 40.0-44.9, adult (HCC) 03/15/2021   Cervical cancer screening 03/15/2021   History of postpartum hemorrhage 08/06/2019   Language barrier 04/28/2019   History of cesarean section    Fatty liver disease, nonalcoholic 07/13/2014   Past Medical History:  Diagnosis Date   Anemia    Bilateral impacted cerumen 07/07/2021    Fatty liver    Hepatitis B    Preeclampsia 2021   Previous cesarean delivery, antepartum condition or complication 02/16/2014   OP arrest at 7 cm--desires TOLAC--consent signed    Past Surgical History:  Procedure Laterality Date   ABDOMINAL SURGERY     c-section   CESAREAN SECTION     CESAREAN SECTION  08/21/2011   Procedure: CESAREAN SECTION;  Surgeon: Allie Bossier, MD;  Location: [facility];  Service: Gynecology;  Laterality: N/A;  Primary cesarean section of baby girl  at 75  APGAR 9/9   Social History   Tobacco Use   Smoking status: Never   Smokeless tobacco: Never  Vaping Use   Vaping status: Never Used  Substance Use Topics   Alcohol use: No   Drug use: No   Social History   Socioeconomic History   Marital status: Significant Other    Spouse name: Not on file   Number of children: Not on file   Years of education: Not on file   Highest education level: Not on file  Occupational History   Not on file  Tobacco Use   Smoking status: Never   Smokeless tobacco: Never  Vaping Use   Vaping status: Never Used  Substance and Sexual Activity   Alcohol use: No   Drug use: No   Sexual activity: Yes    Birth control/protection: None  Other Topics Concern   Not on file  Social History Narrative   ** Merged History Encounter **       Social Determinants of Health   Financial Resource Strain: Not on file  Food Insecurity: No Food Insecurity (03/01/2022)   Hunger Vital Sign    Worried About Running Out of Food in the Last Year: Never true    Ran Out of Food in the Last Year: Never true  Transportation Needs: No Transportation Needs (03/01/2022)   PRAPARE - Administrator, Civil Service (Medical): No    Lack of Transportation (Non-Medical): No  Physical Activity: Not on file  Stress: Not on file  Social Connections: Not on file  Intimate Partner Violence: Not on file   Family Status  Relation Name Status   Mother  Alive   Father  Alive   Neg Hx  (Not  Specified)  No partnership data on file   Family History  Problem Relation Age of Onset   Alcohol abuse Neg Hx    Arthritis Neg Hx    Asthma Neg Hx    Birth defects Neg Hx    Cancer Neg Hx    COPD Neg Hx    Depression Neg Hx    Diabetes Neg Hx    Drug abuse Neg Hx    Early death Neg Hx    Hearing loss Neg Hx    Heart disease Neg Hx    Hyperlipidemia Neg Hx    Hypertension Neg Hx    Kidney disease Neg Hx    Learning disabilities Neg Hx    Mental illness Neg Hx  Mental retardation Neg Hx    Miscarriages / Stillbirths Neg Hx    Stroke Neg Hx    Vision loss Neg Hx    No Known Allergies  Review of Systems  Constitutional: Negative.  Negative for chills, diaphoresis, fever, malaise/fatigue and weight loss.  HENT:  Negative for congestion, ear discharge, ear pain, hearing loss, nosebleeds, sore throat and tinnitus.   Eyes: Negative.  Negative for blurred vision, double vision, photophobia and discharge.  Respiratory: Negative.  Negative for cough, hemoptysis, sputum production, shortness of breath, wheezing and stridor.        No excess mucus  Cardiovascular: Negative.  Negative for chest pain, palpitations, orthopnea, claudication, leg swelling and PND.  Gastrointestinal:  Negative for abdominal pain, blood in stool, constipation, diarrhea, heartburn, melena, nausea and vomiting.  Genitourinary: Negative.  Negative for dysuria, flank pain, frequency, hematuria and urgency.  Musculoskeletal: Negative.  Negative for back pain, falls, joint pain, myalgias and neck pain.  Skin: Negative.  Negative for itching and rash.  Neurological:  Negative for dizziness, tingling, tremors, sensory change, speech change, focal weakness, seizures, loss of consciousness, weakness and headaches.       Bilateral hand and leg paresthesia  Endo/Heme/Allergies: Negative.  Negative for environmental allergies and polydipsia. Does not bruise/bleed easily.  Psychiatric/Behavioral: Negative.  Negative for  depression, hallucinations, memory loss, substance abuse and suicidal ideas. The patient is not nervous/anxious and does not have insomnia.   All other systems reviewed and are negative.     Objective:     BP 114/73 (BP Location: Left Arm, Patient Position: Sitting, Cuff Size: Large)   Pulse 94   Ht 5\' 1"  (1.549 m)   Wt 255 lb 3.2 oz (115.8 kg)   LMP 05/29/2022 (Approximate)   SpO2 99%   BMI 48.22 kg/m  BP Readings from Last 3 Encounters:  09/07/22 114/73  08/17/22 126/77  04/12/22 112/78   Wt Readings from Last 3 Encounters:  09/07/22 255 lb 3.2 oz (115.8 kg)  08/17/22 235 lb (106.6 kg)  04/12/22 244 lb (110.7 kg)      Physical Exam Exam conducted with a chaperone present.  Constitutional:      General: She is not in acute distress.    Appearance: She is obese.  HENT:     Right Ear: Tympanic membrane and ear canal normal. There is no impacted cerumen.     Left Ear: Tympanic membrane and ear canal normal. There is no impacted cerumen.     Nose: Nose normal.     Mouth/Throat:     Mouth: Mucous membranes are moist.     Pharynx: Oropharynx is clear.  Cardiovascular:     Rate and Rhythm: Normal rate and regular rhythm.     Pulses: Normal pulses.     Heart sounds: Normal heart sounds.  Pulmonary:     Effort: Pulmonary effort is normal.     Breath sounds: Normal breath sounds.  Chest:  Breasts:    Breasts are symmetrical.     Right: Normal. No swelling, bleeding, inverted nipple, mass, nipple discharge, skin change or tenderness.     Left: Normal. No swelling, bleeding, inverted nipple, mass, nipple discharge, skin change or tenderness.  Abdominal:     General: Abdomen is flat. Bowel sounds are normal.     Palpations: Abdomen is soft.     Tenderness: There is no abdominal tenderness. Negative signs include Murphy's sign and McBurney's sign.  Neurological:     General: No focal deficit present.  Psychiatric:        Mood and Affect: Mood normal.        Behavior:  Behavior normal.      No results found for any visits on 09/07/22.  Last CBC Lab Results  Component Value Date   WBC 7.0 12/22/2021   HGB 13.9 12/22/2021   HCT 41.7 12/22/2021   MCV 89 12/22/2021   MCH 29.7 12/22/2021   RDW 12.9 12/22/2021   PLT 266 12/22/2021   Last metabolic panel Lab Results  Component Value Date   GLUCOSE 105 (H) 03/01/2022   NA 139 03/01/2022   K 4.4 03/01/2022   CL 103 03/01/2022   CO2 22 03/01/2022   BUN 11 03/01/2022   CREATININE 0.54 (L) 03/01/2022   EGFR 130 03/01/2022   CALCIUM 9.3 03/01/2022   PROT 7.0 03/01/2022   ALBUMIN 4.5 03/01/2022   LABGLOB 2.5 03/01/2022   AGRATIO 1.8 03/01/2022   BILITOT 0.9 03/01/2022   ALKPHOS 67 03/01/2022   AST 17 03/01/2022   ALT 31 03/01/2022   ANIONGAP 9 12/22/2020   Last lipids Lab Results  Component Value Date   CHOL 126 03/15/2021   HDL 34 (L) 03/15/2021   LDLCALC 72 03/15/2021   TRIG 105 03/15/2021   CHOLHDL 3.7 03/15/2021   Last hemoglobin A1c Lab Results  Component Value Date   HGBA1C 5.7 (H) 03/01/2022   Last thyroid functions Lab Results  Component Value Date   TSH 1.602 12/22/2020   Last vitamin D Lab Results  Component Value Date   VD25OH 22.1 (L) 03/15/2021   Last vitamin B12 and Folate Lab Results  Component Value Date   FOLATE 16.8 03/15/2021    The ASCVD Risk score (Arnett DK, et al., 2019) failed to calculate for the following reasons:   The 2019 ASCVD risk score is only valid for ages 2 to 63    Assessment & Plan:   Problem List Items Addressed This Visit       Endocrine   PCOS (polycystic ovarian syndrome) - Primary    Associated with irregular menstrual cycle and occasional excess bleeding we will continue current program of Ortho-Cyclen and refer back to gynecology      Relevant Orders   Ambulatory referral to Gynecology     Nervous and Auditory   RESOLVED: Bilateral impacted cerumen    This has resolved        Other   Morbid obesity with BMI  of 40.0-44.9, adult [facility])    The following Lifestyle Medicine recommendations according to American College of Lifestyle Medicine [facility]) were discussed and offered to patient who agrees to start the journey:  A. Whole Foods, Plant-based plate comprising of fruits and vegetables, plant-based proteins, whole-grain carbohydrates was discussed in detail with the patient.   A list for source of those nutrients were also provided to the patient.  Patient will use only water or unsweetened tea for hydration. B.  The need to stay away from risky substances including alcohol, smoking; obtaining 7 to 9 hours of restorative sleep, at least 150 minutes of moderate intensity exercise weekly, the importance of healthy social connections,  and stress reduction techniques were discussed. C.  A full color page of  Calorie density of various food groups per pound showing examples of each food groups was provided to the patient.       Iron deficiency anemia due to chronic blood loss    Monitor and repeat blood count today  Irregular menstruation    Referral back to gynecology made      Relevant Orders   Ambulatory referral to Gynecology   CBC with Differential/Platelet   Other Visit Diagnoses     Female hirsutism       Relevant Medications   norgestimate-ethinyl estradiol (ORTHO-CYCLEN) 0.25-35 MG-MCG tablet   History of irregular menstrual bleeding       Relevant Orders   Ambulatory referral to Gynecology   CBC with Differential/Platelet      30 minutes spent multiple systems assessed complex decision making complicated by language barrier  Return in about 6 months (around 03/10/2023) for primary care follow up, menstrual bleeding.    Shan Levans, MD

## 2022-09-07 NOTE — Patient Instructions (Addendum)
Refill of medications sent to pharmacy Use cream on scalp once daily after shampoo  Referral back to Dr Scheryl Darter was made Gynecology  Labs today  Review lifestyle medicine handout for weight loss  Return 6 months for primary care  Recarga de medicamentos enviados a farmacia Use crema en el cuero cabelludo una vez al da despus del champ.  Se hizo una remisin al Dr. Scheryl Darter Ginecologa  Laboratorios hoy  Revise el folleto de medicina sobre el estilo de vida para bajar de [place].  Regreso 6 meses para atencin [place]

## 2022-09-07 NOTE — Assessment & Plan Note (Signed)
This has resolved.

## 2022-09-07 NOTE — Assessment & Plan Note (Signed)
Referral back to gynecology made

## 2022-09-07 NOTE — Assessment & Plan Note (Signed)
The following Lifestyle Medicine recommendations according to American College of Lifestyle Medicine (ACLM) were discussed and offered to patient who agrees to start the journey:  A. Whole Foods, Plant-based plate comprising of fruits and vegetables, plant-based proteins, whole-grain carbohydrates was discussed in detail with the patient.   A list for source of those nutrients were also provided to the patient.  Patient will use only water or unsweetened tea for hydration. B.  The need to stay away from risky substances including alcohol, smoking; obtaining 7 to 9 hours of restorative sleep, at least 150 minutes of moderate intensity exercise weekly, the importance of healthy social connections,  and stress reduction techniques were discussed. C.  A full color page of  Calorie density of various food groups per pound showing examples of each food groups was provided to the patient.  

## 2022-09-07 NOTE — Assessment & Plan Note (Addendum)
Monitor and repeat blood count today

## 2022-09-07 NOTE — Assessment & Plan Note (Signed)
Associated with irregular menstrual cycle and occasional excess bleeding we will continue current program of Ortho-Cyclen and refer back to gynecology

## 2022-09-08 ENCOUNTER — Telehealth: Payer: Self-pay

## 2022-09-08 LAB — CBC WITH DIFFERENTIAL/PLATELET
Basophils Absolute: 0 10*3/uL (ref 0.0–0.2)
Basos: 0 %
EOS (ABSOLUTE): 0.1 10*3/uL (ref 0.0–0.4)
Eos: 2 %
Hematocrit: 38.4 % (ref 34.0–46.6)
Hemoglobin: 12.9 g/dL (ref 11.1–15.9)
Immature Grans (Abs): 0 10*3/uL (ref 0.0–0.1)
Immature Granulocytes: 0 %
Lymphocytes Absolute: 2.9 10*3/uL (ref 0.7–3.1)
Lymphs: 36 %
MCH: 29.7 pg (ref 26.6–33.0)
MCHC: 33.6 g/dL (ref 31.5–35.7)
MCV: 89 fL (ref 79–97)
Monocytes Absolute: 0.6 10*3/uL (ref 0.1–0.9)
Monocytes: 7 %
Neutrophils Absolute: 4.3 10*3/uL (ref 1.4–7.0)
Neutrophils: 55 %
Platelets: 299 10*3/uL (ref 150–450)
RBC: 4.34 x10E6/uL (ref 3.77–5.28)
RDW: 13 % (ref 11.7–15.4)
WBC: 8 10*3/uL (ref 3.4–10.8)

## 2022-09-08 NOTE — Telephone Encounter (Signed)
Pt was called and vm was left, Information has been sent to nurse pool.   Interpreter id # [id number] Albin Felling

## 2022-09-08 NOTE — Progress Notes (Signed)
Let pt know blood count normal

## 2022-09-08 NOTE — Telephone Encounter (Signed)
-----   Message from Shan Levans sent at 09/08/2022  6:17 AM EDT ----- Let pt know  blood count normal

## 2022-10-14 ENCOUNTER — Other Ambulatory Visit: Payer: Self-pay | Admitting: Critical Care Medicine

## 2022-11-01 ENCOUNTER — Other Ambulatory Visit (HOSPITAL_COMMUNITY): Payer: Self-pay

## 2023-01-01 ENCOUNTER — Encounter: Payer: Self-pay | Admitting: Obstetrics & Gynecology

## 2023-01-01 ENCOUNTER — Ambulatory Visit (INDEPENDENT_AMBULATORY_CARE_PROVIDER_SITE_OTHER): Payer: Self-pay | Admitting: Obstetrics & Gynecology

## 2023-01-01 ENCOUNTER — Other Ambulatory Visit: Payer: Self-pay

## 2023-01-01 VITALS — BP 123/86 | HR 86 | Wt 257.0 lb

## 2023-01-01 DIAGNOSIS — Z3041 Encounter for surveillance of contraceptive pills: Secondary | ICD-10-CM

## 2023-01-01 DIAGNOSIS — E282 Polycystic ovarian syndrome: Secondary | ICD-10-CM

## 2023-01-01 DIAGNOSIS — Z6841 Body Mass Index (BMI) 40.0 and over, adult: Secondary | ICD-10-CM

## 2023-01-01 DIAGNOSIS — N926 Irregular menstruation, unspecified: Secondary | ICD-10-CM

## 2023-01-01 NOTE — Progress Notes (Signed)
GYNECOLOGY OFFICE VISIT NOTE  History:   Sharon Hess is a 27 y.o. [phone] here today for follow up for PCOS. She was seen back in March for evaluation and was diagnosed with PCOS and placed on combined OCPs. Since then she stated she was having regular periods each month and has since been having a period every other month that aligns with the pill. She is compliant with her OCPs and has not concerns or questions at this time.    Health Maintenance Due  Topic Date Due   INFLUENZA VACCINE  08/24/2022    Past Medical History:  Diagnosis Date   Anemia    Bilateral impacted cerumen 07/07/2021   Fatty liver    Hepatitis B    Preeclampsia 2021   Previous cesarean delivery, antepartum condition or complication 02/16/2014   OP arrest at 7 cm--desires TOLAC--consent signed     Past Surgical History:  Procedure Laterality Date   ABDOMINAL SURGERY     c-section   CESAREAN SECTION     CESAREAN SECTION  08/21/2011   Procedure: CESAREAN SECTION;  Surgeon: Allie Bossier, MD;  Location: [facility];  Service: Gynecology;  Laterality: N/A;  Primary cesarean section of baby girl  at 0644  APGAR 9/9    The following portions of the patient's history were reviewed and updated as appropriate: allergies, current medications, past family history, past medical history, past social history, past surgical history and problem list.   Health Maintenance:   Last pap: Lab Results  Component Value Date   DIAGPAP  04/07/2021    - Negative for intraepithelial lesion or malignancy (NILM)    Last mammogram:  N/A    Review of Systems:  Pertinent items noted in HPI and remainder of comprehensive ROS otherwise negative.  Physical Exam:  BP 123/86   Pulse 86   Wt 257 lb (116.6 kg)   LMP 10/26/2022 (Exact Date) Comment: 4 days  BMI 48.56 kg/m  CONSTITUTIONAL: Well-developed, well-nourished female in no acute distress.  HEENT:  Normocephalic, atraumatic. External right and left ear normal.  No scleral icterus.  NECK: Normal range of motion, supple, no masses noted on observation SKIN: No rash noted. Not diaphoretic. No erythema. No pallor. MUSCULOSKELETAL: Normal range of motion. No edema noted. NEUROLOGIC: Alert and oriented to person, place, and time. Normal muscle tone coordination.  PSYCHIATRIC: Normal mood and affect. Normal behavior. Normal judgment and thought content. RESPIRATORY: Effort normal, no problems with respiration noted ABDOMEN: No masses noted. No other overt distention noted.   PELVIC: Deferred  Labs and Imaging No results found for this or any previous visit (from the past 168 hour(s)). No results found.    Assessment and Plan:  27 yo female here for follow up for PCOS. Since being compliant with her OCPs she has been having periods every other month. We discussed how this can be common for women on birth control to have less periods or even none at all. She is not having any other concerns at this time.   Will follow up for annual gyn visit as needed.   Problem List Items Addressed This Visit   None   Routine preventative health maintenance measures emphasized. Please refer to After Visit Summary for other counseling recommendations.  RTC 6 months  Total face-to-face time with patient: 10 minutes.  Over 50% of encounter was spent on counseling and coordination of care.  MadisonCaitlin M Pepper, Student-PA  Attestation of Attending Supervision of PA Student: Evaluation  and management procedures were performed by the PA student under my supervision and collaboration.  I have reviewed the student's note and chart, and I agree with the management and plan.  Scheryl Darter, MD, FACOG Attending Obstetrician & Gynecologist Faculty Practice, [facility]

## 2023-01-19 ENCOUNTER — Other Ambulatory Visit: Payer: Self-pay | Admitting: Critical Care Medicine

## 2023-01-19 MED ORDER — TRIAMCINOLONE ACETONIDE 0.1 % EX CREA: TOPICAL_CREAM | CUTANEOUS | 0 refills | Status: DC

## 2023-01-27 ENCOUNTER — Emergency Department (HOSPITAL_COMMUNITY)
Admission: EM | Admit: 2023-01-27 | Discharge: 2023-01-28 | Disposition: A | Discharge status: Home / Self Care | Payer: Self-pay | Attending: Emergency Medicine | Admitting: Emergency Medicine

## 2023-01-27 DIAGNOSIS — H60503 Unspecified acute noninfective otitis externa, bilateral: Secondary | ICD-10-CM | POA: Insufficient documentation

## 2023-01-28 ENCOUNTER — Other Ambulatory Visit: Payer: Self-pay

## 2023-01-28 ENCOUNTER — Encounter (HOSPITAL_COMMUNITY): Payer: Self-pay

## 2023-01-28 MED ORDER — CIPROFLOXACIN-DEXAMETHASONE 0.3-0.1 % OT SUSP
2.0000 [drp] | Freq: Once | OTIC | Status: AC
  Administered 2023-01-28: 2 [drp] via OTIC
  Filled 2023-01-28: qty 7.5

## 2023-01-28 MED ORDER — CIPROFLOXACIN-DEXAMETHASONE 0.3-0.1 % OT SUSP: 4.0000 [drp] | Freq: Two times a day (BID) | OTIC | 0 refills | Status: DC

## 2023-01-28 NOTE — Discharge Instructions (Addendum)
 You are found to have infection of your outer ear and both of your ears today.  Please begin taking Ciprodex  drops.  You may place 2 drops in each ear twice daily.  Please do this for 1 week.  Please follow-up with your PCP for reevaluation.  Please continue taking ibuprofen  at home for pain.  Please return to the ED with any new or worsening symptoms.  Se descubre que hoy tiene una infeccin en el odo externo y en ambos odos.  Por favor comience a tomar las gotas de Ciprodex .  Puede colocar 2 gotas en cada odo dos veces al da.  Por favor haga esto durante 1 semana.  Haga un seguimiento con su PCP para una reevaluacin.  Contine tomando ibuprofeno en casa para el dolor.  Regrese al servicio de urgencias si presenta algn sntoma nuevo o que empeore.

## 2023-01-28 NOTE — ED Provider Notes (Signed)
 [place] EMERGENCY DEPARTMENT AT [facility] Provider Note   CSN: [id number] Arrival date & time: 01/27/23  2317     History  Chief Complaint  Patient presents with   Otalgia    Sharon Hess Kyndahl Jablon is a 28 y.o. female with medical history of hepatitis B, anemia.  Patient presents to ED for evaluation of bilateral ear pain since 12/24.  Reports that she has had ongoing bilateral ear pain since the above-stated date.  Denies any recent swimming.  Denies ear drainage, loss of hearing.  Denies any sore throat, congestion, URI symptoms.  Reports she is been taking ibuprofen  for pain at home.  Denies history of tympanostomy tubes.   Otalgia Associated symptoms: no ear discharge and no fever        Home Medications Prior to Admission medications   Medication Sig Start Date End Date Taking? Authorizing Provider  ciprofloxacin -dexamethasone  (CIPRODEX ) OTIC suspension Place 4 drops into both ears 2 (two) times daily. 01/28/23  Yes Ruthell Lonni FALCON, PA-C  norgestimate -ethinyl estradiol  (ORTHO-CYCLEN) 0.25-35 MG-MCG tablet Take 1 tablet by mouth daily. 09/07/22   Brien Belvie BRAVO, MD  triamcinolone  cream (KENALOG ) 0.1 % APPLY TO THE AFFECTED AREA TWICE DAILY TO SCALP AFTER SHAMPOOStrength: 0.1 % 01/19/23   Vicci Barnie NOVAK, MD  VITAMIN D  PO Take 1 Dose by mouth daily.    [provider]      Allergies    Patient has no known allergies.    Review of Systems   Review of Systems  Constitutional:  Negative for fever.  HENT:  Positive for ear pain. Negative for ear discharge.   All other systems reviewed and are negative.   Physical Exam Updated Vital Signs BP 127/81 (BP Location: Right Arm)   Pulse (!) 102   Temp 99 F (37.2 C) (Oral)   Resp 18   Ht 5' 1 (1.549 m)   Wt 116.6 kg   LMP 10/26/2022 (Exact Date) Comment: 4 days  SpO2 100%   BMI 48.56 kg/m  Physical Exam Vitals and nursing note reviewed.  Constitutional:      General: She is not in  acute distress.    Appearance: Normal appearance. She is obese. She is not ill-appearing, toxic-appearing or diaphoretic.  HENT:     Head: Normocephalic and atraumatic.     Ears:     Comments: There is swelling of ear canals bilaterally. No active drainage. No swelling of the outer ear. No mastoid swelling, erythema, TTP    Nose: Nose normal.     Mouth/Throat:     Mouth: Mucous membranes are moist.     Pharynx: Oropharynx is clear.     Comments: Uvula midline, handling secretions appropriately.  No posterior oropharynx erythema.  No exudate. Eyes:     Extraocular Movements: Extraocular movements intact.     Conjunctiva/sclera: Conjunctivae normal.     Pupils: Pupils are equal, round, and reactive to light.  Neck:     Comments: No meningismus. Cardiovascular:     Rate and Rhythm: Normal rate and regular rhythm.  Pulmonary:     Effort: Pulmonary effort is normal. No respiratory distress.     Breath sounds: Normal breath sounds. No wheezing.  Abdominal:     General: Abdomen is flat. Bowel sounds are normal.     Palpations: Abdomen is soft.     Tenderness: There is no abdominal tenderness.  Musculoskeletal:     Cervical back: Normal range of motion and neck supple. No tenderness.  Skin:    General: Skin is warm and dry.     Capillary Refill: Capillary refill takes less than 2 seconds.  Neurological:     Mental Status: She is alert and oriented to person, place, and time.     ED Results / Procedures / Treatments   Labs (all labs ordered are listed, but only abnormal results are displayed) Labs Reviewed - No data to display  EKG None  Radiology No results found.  Procedures Procedures   Medications Ordered in ED Medications - No data to display  ED Course/ Medical Decision Making/ A&P    Medical Decision Making  28 year old female presents to ED for evaluation of bilateral ear pain.  Please see HPI for further details.  On exam patient has swelling of her  bilateral ear canals consistent with otitis externa.  No perforated TMs.  No meningismus on exam.  Posterior oropharynx without erythema or exudate, uvula midline, handling secretions appropriately.  Will start patient on Ciprodex  twice daily x 1 week.  Have encouraged her to follow-up with her PCP for recheck.  Patient provided instructions utilizing language translator.  She voiced understanding with instructions.  She is stable to discharge home.   Final Clinical Impression(s) / ED Diagnoses Final diagnoses:  Acute otitis externa of both ears, unspecified type    Rx / DC Orders ED Discharge Orders          Ordered    ciprofloxacin -dexamethasone  (CIPRODEX ) OTIC suspension  2 times daily        01/28/23 0555              Ruthell Lonni FALCON, PA-C 01/28/23 0556    Elnor Jayson LABOR, DO 01/28/23 [phone]

## 2023-01-28 NOTE — ED Triage Notes (Signed)
 Pt reports BIL ear pain since 12/24

## 2023-05-09 ENCOUNTER — Emergency Department (HOSPITAL_COMMUNITY)
Admission: EM | Admit: 2023-05-09 | Discharge: 2023-05-09 | Disposition: A | Discharge status: Home / Self Care | Payer: Self-pay | Attending: Emergency Medicine | Admitting: Emergency Medicine

## 2023-05-09 ENCOUNTER — Other Ambulatory Visit: Payer: Self-pay

## 2023-05-09 ENCOUNTER — Emergency Department (HOSPITAL_COMMUNITY): Payer: Self-pay

## 2023-05-09 ENCOUNTER — Encounter (HOSPITAL_COMMUNITY): Payer: Self-pay

## 2023-05-09 DIAGNOSIS — E876 Hypokalemia: Secondary | ICD-10-CM | POA: Insufficient documentation

## 2023-05-09 DIAGNOSIS — R0789 Other chest pain: Secondary | ICD-10-CM

## 2023-05-09 DIAGNOSIS — K219 Gastro-esophageal reflux disease without esophagitis: Secondary | ICD-10-CM | POA: Insufficient documentation

## 2023-05-09 LAB — COMPREHENSIVE METABOLIC PANEL WITH GFR
ALT: 25 U/L (ref 0–44)
AST: 26 U/L (ref 15–41)
Albumin: 3.8 g/dL (ref 3.5–5.0)
Alkaline Phosphatase: 60 U/L (ref 38–126)
Anion gap: 10 (ref 5–15)
BUN: 12 mg/dL (ref 6–20)
CO2: 26 mmol/L (ref 22–32)
Calcium: 9.6 mg/dL (ref 8.9–10.3)
Chloride: 105 mmol/L (ref 98–111)
Creatinine, Ser: 0.68 mg/dL (ref 0.44–1.00)
GFR, Estimated: >60 mL/min (ref >60)
Glucose, Bld: 143 mg/dL — ABNORMAL HIGH (ref 70–99)
Potassium: 3.3 mmol/L — ABNORMAL LOW (ref 3.5–5.1)
Sodium: 141 mmol/L (ref 135–145)
Total Bilirubin: 0.8 mg/dL (ref 0.0–1.2)
Total Protein: 7.2 g/dL (ref 6.5–8.1)

## 2023-05-09 LAB — CBC WITH DIFFERENTIAL/PLATELET
Abs Immature Granulocytes: 0.03 10*3/uL (ref 0.00–0.07)
Basophils Absolute: 0 10*3/uL (ref 0.0–0.1)
Basophils Relative: 1 %
Eosinophils Absolute: 0.3 10*3/uL (ref 0.0–0.5)
Eosinophils Relative: 3 %
HCT: 39.3 % (ref 36.0–46.0)
Hemoglobin: 12.8 g/dL (ref 12.0–15.0)
Immature Granulocytes: 0 %
Lymphocytes Relative: 27 %
Lymphs Abs: 2.3 10*3/uL (ref 0.7–4.0)
MCH: 28.7 pg (ref 26.0–34.0)
MCHC: 32.6 g/dL (ref 30.0–36.0)
MCV: 88.1 fL (ref 80.0–100.0)
Monocytes Absolute: 0.5 10*3/uL (ref 0.1–1.0)
Monocytes Relative: 6 %
Neutro Abs: 5.4 10*3/uL (ref 1.7–7.7)
Neutrophils Relative %: 63 %
Platelets: 306 10*3/uL (ref 150–400)
RBC: 4.46 MIL/uL (ref 3.87–5.11)
RDW: 13.9 % (ref 11.5–15.5)
WBC: 8.6 10*3/uL (ref 4.0–10.5)
nRBC: 0 % (ref 0.0–0.2)

## 2023-05-09 LAB — T4, FREE: Free T4: 0.84 ng/dL (ref 0.61–1.12)

## 2023-05-09 LAB — RESP PANEL BY RT-PCR (RSV, FLU A&B, COVID)  RVPGX2
Influenza A by PCR: NEGATIVE
Influenza B by PCR: NEGATIVE
Resp Syncytial Virus by PCR: NEGATIVE
SARS Coronavirus 2 by RT PCR: NEGATIVE

## 2023-05-09 LAB — D-DIMER, QUANTITATIVE: D-Dimer, Quant: <0.27 ug{FEU}/mL (ref 0.00–0.50)

## 2023-05-09 LAB — TROPONIN I (HIGH SENSITIVITY): Troponin I (High Sensitivity): 6 ng/L (ref <18)

## 2023-05-09 LAB — TSH: TSH: 2.356 u[IU]/mL (ref 0.350–4.500)

## 2023-05-09 LAB — HCG, SERUM, QUALITATIVE: Preg, Serum: NEGATIVE

## 2023-05-09 MED ORDER — ACETAMINOPHEN 500 MG PO TABS
1000.0000 mg | ORAL_TABLET | Freq: Once | ORAL | Status: AC
  Administered 2023-05-09: 1000 mg via ORAL
  Filled 2023-05-09: qty 2

## 2023-05-09 MED ORDER — FAMOTIDINE 20 MG PO TABS: 20.0000 mg | ORAL_TABLET | Freq: Two times a day (BID) | ORAL | 0 refills | Status: AC

## 2023-05-09 MED ORDER — POTASSIUM CHLORIDE CRYS ER 20 MEQ PO TBCR
40.0000 meq | EXTENDED_RELEASE_TABLET | Freq: Once | ORAL | Status: AC
  Administered 2023-05-09: 40 meq via ORAL
  Filled 2023-05-09: qty 2

## 2023-05-09 MED ORDER — ONDANSETRON 4 MG PO TBDP
4.0000 mg | ORAL_TABLET | Freq: Once | ORAL | Status: AC
  Administered 2023-05-09: 4 mg via ORAL
  Filled 2023-05-09: qty 1

## 2023-05-09 MED ORDER — ALUM & MAG HYDROXIDE-SIMETH 200-200-20 MG/5ML PO SUSP
15.0000 mL | Freq: Once | ORAL | Status: AC
  Administered 2023-05-09: 15 mL via ORAL
  Filled 2023-05-09: qty 30

## 2023-05-09 NOTE — ED Notes (Signed)
 Patient transported to X-ray

## 2023-05-09 NOTE — ED Provider Notes (Signed)
 [place] EMERGENCY DEPARTMENT AT [facility] Provider Note   CSN: [id number] Arrival date & time: 05/09/23  0044     History  Chief Complaint  Patient presents with   Chest Pain    Sharon Hess Shalene Gallen is a 28 y.o. female with PMHx hep b, IDA, who presents to ED concerned for SOB, chest tightness, and palpations since late last night. Patient stating that these symptoms have since resolved, but they are now tasting something metallic when they swallow. Patient also stating that she feels a vague generalized "tingling" on her entire body.  Denies fever, cough, nausea, vomiting, diarrhea.  [employer] [place] #[id number]  Chest Pain      Home Medications Prior to Admission medications   Medication Sig Start Date End Date Taking? Authorizing Provider  famotidine (PEPCID) 20 MG tablet Take 1 tablet (20 mg total) by mouth 2 (two) times daily for 14 days. 05/09/23 05/23/23 Yes Charlissa Petros, Charlotte Sanes F, PA-C  ciprofloxacin-dexamethasone (CIPRODEX) OTIC suspension Place 4 drops into both ears 2 (two) times daily. 01/28/23   Al Decant, PA-C  norgestimate-ethinyl estradiol (ORTHO-CYCLEN) 0.25-35 MG-MCG tablet Take 1 tablet by mouth daily. 09/07/22   Storm Frisk, MD  triamcinolone cream (KENALOG) 0.1 % APPLY TO THE AFFECTED AREA TWICE DAILY TO SCALP AFTER SHAMPOOStrength: 0.1 % 01/19/23   Marcine Matar, MD  VITAMIN D PO Take 1 Dose by mouth daily.    [provider]      Allergies    Patient has no known allergies.    Review of Systems   Review of Systems  Cardiovascular:  Positive for chest pain.    Physical Exam Updated Vital Signs BP (!) 144/99   Pulse 77   Temp 98 F (36.7 C) (Oral)   Resp 19   Ht 5\' 1"  (1.549 m)   Wt 116.6 kg   LMP  (LMP Unknown)   SpO2 100%   BMI 48.57 kg/m  Physical Exam Vitals and nursing note reviewed.  Constitutional:      General: She is not in acute distress.    Appearance: She is not ill-appearing,  toxic-appearing or diaphoretic.  HENT:     Head: Normocephalic and atraumatic.     Mouth/Throat:     Mouth: Mucous membranes are moist.     Pharynx: No oropharyngeal exudate or posterior oropharyngeal erythema.  Eyes:     General: No scleral icterus.       Right eye: No discharge.        Left eye: No discharge.     Conjunctiva/sclera: Conjunctivae normal.  Cardiovascular:     Rate and Rhythm: Normal rate and regular rhythm.     Pulses: Normal pulses.     Heart sounds: Normal heart sounds. No murmur heard. Pulmonary:     Effort: Pulmonary effort is normal. No respiratory distress.     Breath sounds: Normal breath sounds. No wheezing, rhonchi or rales.  Abdominal:     General: Bowel sounds are normal.     Palpations: Abdomen is soft.  Musculoskeletal:     Right lower leg: No edema.     Left lower leg: No edema.  Skin:    General: Skin is warm and dry.     Findings: No rash.  Neurological:     General: No focal deficit present.     Mental Status: She is alert and oriented to person, place, and time. Mental status is at baseline.  Psychiatric:  Mood and Affect: Mood normal.     ED Results / Procedures / Treatments   Labs (all labs ordered are listed, but only abnormal results are displayed) Labs Reviewed  COMPREHENSIVE METABOLIC PANEL WITH GFR - Abnormal; Notable for the following components:      Result Value   Potassium 3.3 (*)    Glucose, Bld 143 (*)    All other components within normal limits  RESP PANEL BY RT-PCR (RSV, FLU A&B, COVID)  RVPGX2  HCG, SERUM, QUALITATIVE  CBC WITH DIFFERENTIAL/PLATELET  D-DIMER, QUANTITATIVE  TSH  T4, FREE  TROPONIN I (HIGH SENSITIVITY)    EKG None  Radiology DG Chest 2 View Result Date: 05/09/2023 CLINICAL DATA:  Chest pain EXAM: CHEST - 2 VIEW COMPARISON:  None Available. FINDINGS: Lungs volumes are small, but are symmetric and are clear. No pneumothorax or pleural effusion. Cardiac size within normal limits. Pulmonary  vascularity is normal. Osseous structures are age-appropriate. No acute bone abnormality. IMPRESSION: 1. Pulmonary hypoinflation. Electronically Signed   By: Worthy Heads M.D.   On: 05/09/2023 02:04    Procedures Procedures    Medications Ordered in ED Medications  ondansetron (ZOFRAN-ODT) disintegrating tablet 4 mg (has no administration in time range)  potassium chloride SA (KLOR-CON M) CR tablet 40 mEq (40 mEq Oral Given 05/09/23 0911)  acetaminophen (TYLENOL) tablet 1,000 mg (1,000 mg Oral Given 05/09/23 0911)  alum & mag hydroxide-simeth (MAALOX/MYLANTA) 200-200-20 MG/5ML suspension 15 mL (15 mLs Oral Given 05/09/23 0910)    ED Course/ Medical Decision Making/ A&P Clinical Course as of 05/09/23 1013  Wed May 09, 2023  0849 [employer] [place] #[id number] [SM]    Clinical Course User Index [SM] Moline Bureau, PA-C                                 Medical Decision Making Amount and/or Complexity of Data Reviewed Labs: ordered. Radiology: ordered.   This patient presents to the ED for concern of shortness of breath, this involves an extensive number of treatment options, and is a complaint that carries with it a high risk of complications and morbidity.  The differential diagnosis includes Anxiety, Anaphylaxis/Angioedema, Aspirated FB, Arrhythmia, CHF, Asthma, COPD, PNA, COVID/Flu/RSV, STEMI, Tamponade, TPNX, Sepsis   Co morbidities that complicate the patient evaluation  hep b, IDA   Additional history obtained:  Dr. Brent Cambric PCP   Problem List / ED Course / Critical interventions / Medication management  Patient presents to ED concerned for episode of SOB, palpitations, and chest tightness late last night. These symptoms have resolved and patient concerned for metallic taste in throat and generalized "tingling" sensation. Physical exam reassuring. Patient afebrile with stable vitals. Patient denies any other recent infectious symptoms. I personally interpreted  labs ordered by triage provider.  TSH/T4 within normal limits. d-dimer <0.27. Resp panel negative. CMP with mild hypokalemia at 3.3. CBC without leukocytosis or anemia. Hcg negative. Troponin within normal limits. HEART score 0. The patient was maintained on a cardiac monitor.  I personally viewed and interpreted the cardiac monitored which showed an underlying rhythm of: sinus rhythm I ordered imaging studies including chest xray to assess for process contributing to patient's symptoms. I independently visualized and interpreted imaging which showed no acute cardiopulmonary disease. I agree with the radiologist interpretation. Shared all results with patient. Answered all questions. History and lab workup mostly concerning for GERD. Provided patient with GI cocktail. Will prescribe patient Pepcid  and have them follow up with PCP. Patient verbalized understanding of plan. Patient ready to go home. Upon discharge, patient asking me to look in her ears as they both have been causing her pain recently. Ear exam with copious cerumen but otherwise reassuring. Patient understands to follow up with PCP for this concern if it persists. I have reviewed the patients home medicines and have made adjustments as needed Patient was given return precautions. Patient stable for discharge at this time. Patient verbalized understanding of plan.  DDx: These are considered less likely due to history of present illness and physical exam findings -Arrhythmia/STEMI: EKG/trop reassuring -Asthma/COPD/PNA/COVID/Flu/RSV: Lungs clear to auscultation bilaterally and O2 sat 98% on RA -Tamponade: physical exam and chest xray reassuring -CHF: no physical exam findings -TPNX: Lungs clear to auscultation bilaterally -Sepsis: afebrile and other vital signs stable   Social Determinants of Health:  Foreign language          Final Clinical Impression(s) / ED Diagnoses Final diagnoses:  Atypical chest pain   Gastroesophageal reflux disease, unspecified whether esophagitis present    Rx / DC Orders ED Discharge Orders          Ordered    famotidine (PEPCID) 20 MG tablet  2 times daily        05/09/23 1010               Bureau, [place] 05/09/23 1013    Tegeler, Marine Sia, MD 05/09/23 [phone]

## 2023-05-09 NOTE — ED Triage Notes (Signed)
 Pt BIB [facility] from home that started having CP in bed radiating to ABD with SOB.

## 2023-05-09 NOTE — Discharge Instructions (Addendum)
 It was a pleasure caring for you today. Please follow up with your primary care provider. Seek emergency care if experiencing any new or worsening symptoms.

## 2023-05-09 NOTE — ED Provider Triage Note (Signed)
 Emergency Medicine Provider Triage Evaluation Note  Sharon Hess [place] , a 28 y.o. female  was evaluated in triage.  Pt complains of sob and chest tightness with heart racing that began before bed. States she feels like she can't catch her breath. Denies anxiety. No OCP use.  Review of Systems  Positive: Chest pain, sob Negative: fever  Physical Exam  BP (!) 146/88   Pulse (!) 101   Temp 99.1 F (37.3 C) (Oral)   Resp 18   Ht 5\' 1"  (1.549 m)   Wt 116.6 kg   LMP  (LMP Unknown)   SpO2 100%   BMI 48.57 kg/m  Gen:   Awake, no distress   Resp:  Normal effort  MSK:   Moves extremities without difficulty  Other:  tachycardic  Medical Decision Making  Medically screening exam initiated at 1:22 AM.  Appropriate orders placed.  [facility] Sharon Hess was informed that the remainder of the evaluation will be completed by another provider, this initial triage assessment does not replace that evaluation, and the importance of remaining in the ED until their evaluation is complete.     Timmy Forbes, [place] 05/09/23 [phone]

## 2023-05-11 ENCOUNTER — Ambulatory Visit: Payer: Self-pay

## 2023-05-11 NOTE — Telephone Encounter (Signed)
   Chief Complaint: chest pain Symptoms: SOB, body numbness  Disposition: [x] ED /[] Urgent Care (no appt availability in office) / [] Appointment(In office/virtual)/ []  [facility]/ [] Home Care/ [] Refused Recommended Disposition /[] [facility]/ []  Follow-up with PCP Additional Notes: Pt called with interpreter [place] ID [phone] with chest pain, SOB, and whole body numbness. Pt was seen on 4/16, in ED,  with chest pain. Pt stated the pain went away yesterday but  came back last night. Pt stated the chest pain "is all around my breast and it feels like someone is hitting me.It hurts to breathe. I can't take deep breaths. My whole body is numb." Pt rated pain 8/10. RN advised pt to return back to ED now as this are serious concerns. Pt verbalized understanding. RN advised pt to call back Monday and make follow-up appt with PCP. PT verbalized understanding.           Copied from CRM [id number]. Topic: Clinical - Red Word Triage >> May 11, 2023  8:27 AM Rosamond Comes wrote: Red Word that prompted transfer to Nurse Triage: patient called asked for spanish interpreter.  Interpreter ID [id number] patient says her whole body is numb, head hurts and room is spinning, hard to breath Reason for Disposition  SEVERE chest pain  Answer Assessment - Initial Assessment Questions 1. LOCATION: "Where does it hurt?"       All around breast 2. RADIATION: "Does the pain go anywhere else?" (e.g., into neck, jaw, arms, back)     Moves to middle of chest 3. ONSET: "When did the chest pain begin?" (Minutes, hours or days)      4/16 4. PATTERN: "Does the pain come and go, or has it been constant since it started?"  "Does it get worse with exertion?"      Comes and goes   6. SEVERITY: "How bad is the pain?"  (e.g., Scale 1-10; mild, moderate, or severe)    - MILD (1-3): doesn't interfere with normal activities     - MODERATE (4-7): interferes with normal activities or awakens from sleep    - SEVERE  (8-10): excruciating pain, unable to do any normal activities       8  10. OTHER SYMPTOMS: "Do you have any other symptoms?" (e.g., dizziness, nausea, vomiting, sweating, fever, difficulty breathing, cough)       Whole body numbness, SOB, headache  Protocols used: Chest Pain-A-AH

## 2023-05-11 NOTE — Telephone Encounter (Signed)
 FYI   Rashiema could we reach out to pt an schedule a follow up with any available provider

## 2023-05-12 ENCOUNTER — Emergency Department (HOSPITAL_COMMUNITY): Payer: Self-pay

## 2023-05-12 ENCOUNTER — Encounter (HOSPITAL_COMMUNITY): Payer: Self-pay

## 2023-05-12 ENCOUNTER — Other Ambulatory Visit: Payer: Self-pay

## 2023-05-12 ENCOUNTER — Emergency Department (HOSPITAL_COMMUNITY)
Admission: EM | Admit: 2023-05-12 | Discharge: 2023-05-12 | Disposition: A | Discharge status: Home / Self Care | Payer: Self-pay | Attending: Emergency Medicine | Admitting: Emergency Medicine

## 2023-05-12 DIAGNOSIS — R079 Chest pain, unspecified: Secondary | ICD-10-CM | POA: Insufficient documentation

## 2023-05-12 DIAGNOSIS — G4700 Insomnia, unspecified: Secondary | ICD-10-CM | POA: Insufficient documentation

## 2023-05-12 DIAGNOSIS — R519 Headache, unspecified: Secondary | ICD-10-CM | POA: Insufficient documentation

## 2023-05-12 LAB — CBC WITH DIFFERENTIAL/PLATELET
Abs Immature Granulocytes: 0.02 10*3/uL (ref 0.00–0.07)
Basophils Absolute: 0 10*3/uL (ref 0.0–0.1)
Basophils Relative: 0 %
Eosinophils Absolute: 0.2 10*3/uL (ref 0.0–0.5)
Eosinophils Relative: 3 %
HCT: 38.2 % (ref 36.0–46.0)
Hemoglobin: 12.5 g/dL (ref 12.0–15.0)
Immature Granulocytes: 0 %
Lymphocytes Relative: 41 %
Lymphs Abs: 3.4 10*3/uL (ref 0.7–4.0)
MCH: 29 pg (ref 26.0–34.0)
MCHC: 32.7 g/dL (ref 30.0–36.0)
MCV: 88.6 fL (ref 80.0–100.0)
Monocytes Absolute: 0.6 10*3/uL (ref 0.1–1.0)
Monocytes Relative: 7 %
Neutro Abs: 4.2 10*3/uL (ref 1.7–7.7)
Neutrophils Relative %: 49 %
Platelets: 291 10*3/uL (ref 150–400)
RBC: 4.31 MIL/uL (ref 3.87–5.11)
RDW: 13.5 % (ref 11.5–15.5)
WBC: 8.5 10*3/uL (ref 4.0–10.5)
nRBC: 0 % (ref 0.0–0.2)

## 2023-05-12 LAB — LIPASE, BLOOD: Lipase: 26 U/L (ref 11–51)

## 2023-05-12 LAB — COMPREHENSIVE METABOLIC PANEL WITH GFR
ALT: 20 U/L (ref 0–44)
AST: 17 U/L (ref 15–41)
Albumin: 3.6 g/dL (ref 3.5–5.0)
Alkaline Phosphatase: 53 U/L (ref 38–126)
Anion gap: 10 (ref 5–15)
BUN: 10 mg/dL (ref 6–20)
CO2: 25 mmol/L (ref 22–32)
Calcium: 9.1 mg/dL (ref 8.9–10.3)
Chloride: 103 mmol/L (ref 98–111)
Creatinine, Ser: 0.61 mg/dL (ref 0.44–1.00)
GFR, Estimated: >60 mL/min (ref >60)
Glucose, Bld: 98 mg/dL (ref 70–99)
Potassium: 3.4 mmol/L — ABNORMAL LOW (ref 3.5–5.1)
Sodium: 138 mmol/L (ref 135–145)
Total Bilirubin: 0.9 mg/dL (ref 0.0–1.2)
Total Protein: 7.2 g/dL (ref 6.5–8.1)

## 2023-05-12 LAB — CK: Total CK: 273 U/L — ABNORMAL HIGH (ref 38–234)

## 2023-05-12 LAB — BRAIN NATRIURETIC PEPTIDE: B Natriuretic Peptide: 5.1 pg/mL (ref 0.0–100.0)

## 2023-05-12 LAB — TROPONIN I (HIGH SENSITIVITY): Troponin I (High Sensitivity): 4 ng/L (ref <18)

## 2023-05-12 LAB — HCG, SERUM, QUALITATIVE: Preg, Serum: NEGATIVE

## 2023-05-12 MED ORDER — IOHEXOL 350 MG/ML SOLN
150.0000 mL | Freq: Once | INTRAVENOUS | Status: AC | PRN
  Administered 2023-05-12: 150 mL via INTRAVENOUS

## 2023-05-12 NOTE — ED Provider Notes (Signed)
 [facility] Provider Note   CSN: [id number] Arrival date & time: 05/12/23  9562     History  Chief Complaint  Patient presents with   Insomnia    Colon [name] is a 28 y.o. female.  HPI      28yo female with history of fatty liver, hepatitis B, anemia, recent ED visit for chest pain presents with concern for insomnia with chest pain, dyspnea, full body tingling, headache.   Reports for the past 4 days has had these symptoms.  Reports that when she lays down at night and around 2 in the morning she develops symptoms symptoms of headache with vertigo, numbness all over her body, shortness of breath and chest pressure.  She feels okay during the day and is able to sleep during the day.  She does not have focal numbness, does not describe change in vision, blurred vision, or difficulty walking during the day.  She does have a whooshing sound in her left ear when she is trying to sleep.  No family history of aneurysm.  Spanish interpreter used.   Past Medical History:  Diagnosis Date   Anemia    Bilateral impacted cerumen 07/07/2021   Fatty liver    Hepatitis B    Preeclampsia 2021   Previous cesarean delivery, antepartum condition or complication 02/16/2014   OP arrest at 7 cm--desires TOLAC--consent signed      Home Medications Prior to Admission medications   Medication Sig Start Date End Date Taking? Authorizing Provider  ciprofloxacin -dexamethasone  (CIPRODEX ) OTIC suspension Place 4 drops into both ears 2 (two) times daily. 01/28/23   Adel Aden, PA-C  famotidine  (PEPCID ) 20 MG tablet Take 1 tablet (20 mg total) by mouth 2 (two) times daily for 14 days. 05/09/23 05/23/23  [place] Bureau, PA-C  norgestimate -ethinyl estradiol  (ORTHO-CYCLEN) 0.25-35 MG-MCG tablet Take 1 tablet by mouth daily. 09/07/22   Vernell Goldsmith, MD  triamcinolone  cream (KENALOG ) 0.1 % APPLY TO THE AFFECTED AREA TWICE DAILY TO SCALP  AFTER SHAMPOOStrength: 0.1 % 01/19/23   Lawrance Presume, MD  VITAMIN D  PO Take 1 Dose by mouth daily.    [provider]      Allergies    Patient has no known allergies.    Review of Systems   Review of Systems  Physical Exam Updated Vital Signs BP (!) 124/97 (BP Location: Left Arm)   Pulse 79   Temp 97.7 F (36.5 C) (Oral)   Resp 18   Ht 5\' 1"  (1.549 m)   Wt 116.6 kg   LMP 03/13/2023   SpO2 100%   BMI 48.57 kg/m  Physical Exam Vitals and nursing note reviewed.  Constitutional:      General: She is not in acute distress.    Appearance: Normal appearance. She is well-developed. She is not ill-appearing or diaphoretic.  HENT:     Head: Normocephalic and atraumatic.  Eyes:     General: No visual field deficit.    Extraocular Movements: Extraocular movements intact.     Conjunctiva/sclera: Conjunctivae normal.     Pupils: Pupils are equal, round, and reactive to light.  Cardiovascular:     Rate and Rhythm: Normal rate and regular rhythm.     Pulses: Normal pulses.     Heart sounds: Normal heart sounds. No murmur heard.    No friction rub. No gallop.  Pulmonary:     Effort: Pulmonary effort is normal. No respiratory distress.  Breath sounds: Normal breath sounds. No wheezing or rales.  Abdominal:     General: There is no distension.     Palpations: Abdomen is soft.     Tenderness: There is no abdominal tenderness. There is no guarding.  Musculoskeletal:        General: No swelling or tenderness.     Cervical back: Normal range of motion.  Skin:    General: Skin is warm and dry.     Findings: No erythema or rash.  Neurological:     General: No focal deficit present.     Mental Status: She is alert and oriented to person, place, and time.     GCS: GCS eye subscore is 4. GCS verbal subscore is 5. GCS motor subscore is 6.     Cranial Nerves: No cranial nerve deficit, dysarthria or facial asymmetry.     Sensory: No sensory deficit.     Motor: No  weakness or tremor.     Coordination: Coordination normal. Finger-Nose-Finger Test normal.     Gait: Gait normal.     ED Results / Procedures / Treatments   Labs (all labs ordered are listed, but only abnormal results are displayed) Labs Reviewed  COMPREHENSIVE METABOLIC PANEL WITH GFR - Abnormal; Notable for the following components:      Result Value   Potassium 3.4 (*)    All other components within normal limits  CK - Abnormal; Notable for the following components:   Total CK 273 (*)    All other components within normal limits  CBC WITH DIFFERENTIAL/PLATELET  LIPASE, BLOOD  BRAIN NATRIURETIC PEPTIDE  HCG, SERUM, QUALITATIVE  TROPONIN I (HIGH SENSITIVITY)    EKG EKG Interpretation Date/Time:  Saturday May 12 2023 08:20:35 EDT Ventricular Rate:  75 PR Interval:  154 QRS Duration:  78 QT Interval:  380 QTC Calculation: 424 R Axis:   19  Text Interpretation: Normal sinus rhythm Normal ECG When compared with ECG of 09-May-2023 01:05, No significant change since last tracing Confirmed by Scarlette Currier ([id number]) on 05/12/2023 10:24:40 AM  Radiology CT ANGIO HEAD NECK W WO CM Result Date: 05/12/2023 CLINICAL DATA:  28 year old female with neurologic deficit. EXAM: CT ANGIOGRAPHY HEAD AND NECK WITH AND WITHOUT CONTRAST TECHNIQUE: Multidetector CT imaging of the head and neck was performed using the standard protocol during bolus administration of intravenous contrast. Multiplanar CT image reconstructions and MIPs were obtained to evaluate the vascular anatomy. Carotid stenosis measurements (when applicable) are obtained utilizing NASCET criteria, using the distal internal carotid diameter as the denominator. RADIATION DOSE REDUCTION: This exam was performed according to the departmental dose-optimization program which includes automated exposure control, adjustment of the mA and/or kV according to patient size and/or use of iterative reconstruction technique. CONTRAST:   OMNIPAQUE  IOHEXOL  350 MG/ML SOLN COMPARISON:  None Available. FINDINGS: CT HEAD Brain: Normal cerebral volume. No midline shift, ventriculomegaly, mass effect, evidence of mass lesion, intracranial hemorrhage or evidence of cortically based acute infarction. Gray-white matter differentiation is within normal limits throughout the brain. Calvarium and skull base: Intact. No osseous abnormality identified. Paranasal sinuses: Minimal paranasal sinus mucosal thickening, mild left maxillary alveolar recess mucous retention cysts. Tympanic cavities and mastoids appear clear. Orbits: Mildly Disconjugate gaze, otherwise negative orbit and scalp soft tissues. CTA NECK Skeleton: Negative. Upper chest: Negative. Other neck: Within normal limits for age. Aortic arch: Initial very early, pulmonary artery dominant contrast timing at 1004 hours, was repeated at 1024 hours with satisfactory arterial contrast bolus. Normal 3  vessel arch. Right carotid system: Dense right subclavian venous contrast streak artifact but the brachiocephalic artery and proximal right CCA appear to remain normal. Negative right carotid bifurcation. Tortuous but otherwise negative cervical right ICA. Left carotid system: Negative, mildly tortuous left ICA. Vertebral arteries: Dense right subclavian venous contrast streak artifact. Right vertebral artery origin is normal. Right vertebral artery is patent and normal to the skull base. Proximal left subclavian artery and left vertebral artery origin are normal. Left vertebral artery is mildly dominant, patent and normal to the skull base. CTA HEAD Posterior circulation: Distal vertebral arteries and vertebrobasilar junction are patent, normal. Left V4 is dominant. Patent basilar artery without stenosis. Patent SCA and PCA origins. Minimal ectasia of the basilar tip (series 29, image 34). Bilateral PCA branches are within normal limits. Anterior circulation: Both ICA siphons are patent. No siphon  atherosclerosis. Probable small infundibulum of the supraclinoid left ICA on series 26, image 142, 2 mm. Contralateral right ICA siphon is patent and within normal limits. Patent carotid termini. Normal MCA and ACA origins. Diminutive or absent anterior communicating artery. Bilateral ACA branches are within normal limits. Left MCA M1 segment and bifurcation are patent without stenosis. Right MCA M1 segment and bifurcation are patent without stenosis. Bilateral MCA branches are within normal limits. Venous sinuses: Good intracranial venous contrast on the 10/20 4 hour images. Major dural venous sinuses and IJ bulbs appear patent and enhancing. Dominant left transverse and sigmoid venous sinuses. Anatomic variants: Dominant left vertebral artery. Dominant left transverse and sigmoid venous sinuses. Review of the MIP images confirms the above findings IMPRESSION: 1. CTA Head and Neck is negative except for mild ICA tortuosity, and a subtle 1-2 mm distal Left ICA siphon infundibulum (favored) versus aneurysm. Recommend outpatient surveillance with noncontrast Head MRA, and referral to neuro endovascular provider if the lesion enlarges. 2. Normal CT appearance of the brain. Electronically Signed   By: Marlise Simpers M.D.   On: 05/12/2023 10:58   DG Chest 2 View Result Date: 05/12/2023 CLINICAL DATA:  28 year old female with shortness of breath. EXAM: CHEST - 2 VIEW COMPARISON:  Chest radiographs 05/09/2023 and earlier. FINDINGS: Low normal lung volumes similar to 2016 radiographs, improved from earlier this month. Normal cardiac size and mediastinal contours. Visualized tracheal air column is within normal limits. No pneumothorax, pulmonary edema, pleural effusion or confluent lung opacity. Negative visible bowel gas and osseous structures. IMPRESSION: No acute cardiopulmonary abnormality. Electronically Signed   By: Marlise Simpers M.D.   On: 05/12/2023 08:25    Procedures Procedures    Medications Ordered in  ED Medications  iohexol  (OMNIPAQUE ) 350 MG/ML injection 150 mL (150 mLs Intravenous Contrast Given 05/12/23 1027)    ED Course/ Medical Decision Making/ A&P                                   27yo female with history of fatty liver, hepatitis B, anemia, recent ED visit for chest pain presents with concern for insomnia with chest pain, dyspnea, full body tingling, headache.   Reviewed recent emergency department visit which showed a normal TSH/T4, normal D-dimer, hCG negative and troponin within normal limits.  She had chest x-ray that showed pulmonary hypoinflation but no acute abnormalities.  In the setting do not feel further evaluation with D-dimer, TSH indicated.  Will repeat troponin given new chest pain tonight, and given limitations of chest x-ray will repeat the study today.  EKG completed and personally evaluated but interpreted by me shows no acute ST changes or signs of pericarditis.  CXR without acute findings, no signs of CHF, pneumonia nor pneumothorax.  Labs completed and interpreted by me show no sign of ACS, no clinically significant electrolyte abnormalities, no clinically significant anemia.  BNP is WNL  Given headache with whooshing sound and dizziness, a CTA was ordered which shows mild ICA tortuosity, subtle 1-2 cm distal left ICA siphon infundibulum versus aneurysm.  Will refer to Neurology for continued monitoring and follow up headache and dizziness. Normal neurologic exam at this time and do ont feel further emergent MRI imaging indicated at this time. Recommend follow up Neurology and PCP, consideration of outpatient sleep studies, outpt ECHO.  Patient discharged in stable condition with understanding of reasons to return.        Final Clinical Impression(s) / ED Diagnoses Final diagnoses:  Insomnia, unspecified type  Acute nonintractable headache, unspecified headache type  Chest pain, unspecified type    Rx / DC Orders ED Discharge Orders           Ordered    Ambulatory referral to Neurology       Comments: An appointment is requested in approximately: 2 weeks   05/12/23 1324              Scarlette Currier, MD 05/12/23 2201

## 2023-05-12 NOTE — ED Triage Notes (Signed)
 Pt arrived from home via Pov c/o not being able to sleep. Pt states that she is very tense and unable to sleep. Pt states that she feels like she will die if she goes to sleep.

## 2023-05-12 NOTE — ED Notes (Signed)
 Patient transported to X-ray

## 2023-05-12 NOTE — ED Notes (Signed)
 Patient transported to CT

## 2023-05-12 NOTE — ED Notes (Signed)
 Upon EDP assessment, Pt c/o chest pain, sob, headache, and "whooshing" in L ear.

## 2023-05-15 ENCOUNTER — Emergency Department (HOSPITAL_COMMUNITY)
Admission: EM | Admit: 2023-05-15 | Discharge: 2023-05-15 | Discharge status: Against Medical Advice | Payer: Self-pay | Attending: Emergency Medicine | Admitting: Emergency Medicine

## 2023-05-15 ENCOUNTER — Encounter (HOSPITAL_COMMUNITY): Payer: Self-pay

## 2023-05-15 ENCOUNTER — Other Ambulatory Visit: Payer: Self-pay

## 2023-05-15 ENCOUNTER — Emergency Department (HOSPITAL_COMMUNITY): Payer: Self-pay

## 2023-05-15 ENCOUNTER — Ambulatory Visit (INDEPENDENT_AMBULATORY_CARE_PROVIDER_SITE_OTHER): Payer: Self-pay | Admitting: Family

## 2023-05-15 VITALS — BP 123/81 | HR 69 | Temp 97.9°F | Resp 16 | Ht 63.5 in | Wt 255.2 lb

## 2023-05-15 DIAGNOSIS — G4700 Insomnia, unspecified: Secondary | ICD-10-CM

## 2023-05-15 DIAGNOSIS — R079 Chest pain, unspecified: Secondary | ICD-10-CM

## 2023-05-15 DIAGNOSIS — J3089 Other allergic rhinitis: Secondary | ICD-10-CM

## 2023-05-15 DIAGNOSIS — Z603 Acculturation difficulty: Secondary | ICD-10-CM

## 2023-05-15 DIAGNOSIS — R519 Headache, unspecified: Secondary | ICD-10-CM

## 2023-05-15 DIAGNOSIS — K219 Gastro-esophageal reflux disease without esophagitis: Secondary | ICD-10-CM

## 2023-05-15 DIAGNOSIS — Z758 Other problems related to medical facilities and other health care: Secondary | ICD-10-CM

## 2023-05-15 DIAGNOSIS — Z5321 Procedure and treatment not carried out due to patient leaving prior to being seen by health care provider: Secondary | ICD-10-CM | POA: Insufficient documentation

## 2023-05-15 LAB — BASIC METABOLIC PANEL WITH GFR
Anion gap: 8 (ref 5–15)
BUN: 11 mg/dL (ref 6–20)
CO2: 23 mmol/L (ref 22–32)
Calcium: 9.2 mg/dL (ref 8.9–10.3)
Chloride: 106 mmol/L (ref 98–111)
Creatinine, Ser: 0.7 mg/dL (ref 0.44–1.00)
GFR, Estimated: >60 mL/min (ref >60)
Glucose, Bld: 106 mg/dL — ABNORMAL HIGH (ref 70–99)
Potassium: 3.9 mmol/L (ref 3.5–5.1)
Sodium: 137 mmol/L (ref 135–145)

## 2023-05-15 LAB — CBC
HCT: 39.9 % (ref 36.0–46.0)
Hemoglobin: 12.8 g/dL (ref 12.0–15.0)
MCH: 28.7 pg (ref 26.0–34.0)
MCHC: 32.1 g/dL (ref 30.0–36.0)
MCV: 89.5 fL (ref 80.0–100.0)
Platelets: 299 10*3/uL (ref 150–400)
RBC: 4.46 MIL/uL (ref 3.87–5.11)
RDW: 13.7 % (ref 11.5–15.5)
WBC: 9.2 10*3/uL (ref 4.0–10.5)
nRBC: 0 % (ref 0.0–0.2)

## 2023-05-15 LAB — HCG, SERUM, QUALITATIVE: Preg, Serum: NEGATIVE

## 2023-05-15 LAB — TROPONIN I (HIGH SENSITIVITY)
Troponin I (High Sensitivity): 4 ng/L (ref <18)
Troponin I (High Sensitivity): 4 ng/L (ref <18)

## 2023-05-15 MED ORDER — CETIRIZINE HCL 10 MG PO TABS: 10.0000 mg | ORAL_TABLET | Freq: Every day | ORAL | 2 refills | Status: AC

## 2023-05-15 MED ORDER — TRAZODONE HCL 50 MG PO TABS: 50.0000 mg | ORAL_TABLET | Freq: Every evening | ORAL | 1 refills | Status: DC | PRN

## 2023-05-15 NOTE — ED Triage Notes (Signed)
 Pt arrived from home via POV c/o chest pain 8/10 that keeps her from sleeping. Pt states that she is not able to sleep for days and that she feels like she is not in her body.

## 2023-05-15 NOTE — Progress Notes (Signed)
 Patient is here for HFU Patient c/o her body going to sleep during award hours.Patient c/o finger and legs tingling and rapid HR when sleep

## 2023-05-15 NOTE — Progress Notes (Signed)
 Patient ID: Sharon Hess, female    DOB: 1995-11-20  MRN: [id number]  CC: Emergency Department Follow-Up  Subjective: Sharon Hess is a 28 y.o. female who presents for Emergency Department follow-up.   Her concerns today include:  - Patient left without being seen after triage at [facility] Emergency Department at [facility] on today 05/15/2023. She denies red flag symptoms. Today patient states having insomnia and allergies and would like to try medications to help. - Patient seen at [facility] Emergency Department at [facility] on 05/12/2023. States she has an appointment later this week with Neurology.  - Patient seen at [facility] Emergency Department at [facility] on 05/09/2023. Reports she is doing well on Famotidine , no issues/concerns.  - No further issues/concerns for discussion today.   Patient Active Problem List   Diagnosis Date Noted   Irregular menstruation 09/07/2022   PCOS (polycystic ovarian syndrome) 03/22/2022   Iron deficiency anemia due to chronic blood loss 07/07/2021   Acne 03/15/2021   Morbid obesity with BMI of 40.0-44.9, adult (HCC) 03/15/2021   Cervical cancer screening 03/15/2021   History of postpartum hemorrhage 08/06/2019   Language barrier 04/28/2019   History of cesarean section    Fatty liver disease, nonalcoholic 07/13/2014     Current Outpatient Medications on File Prior to Visit  Medication Sig Dispense Refill   ciprofloxacin -dexamethasone  (CIPRODEX ) OTIC suspension Place 4 drops into both ears 2 (two) times daily. 7.5 mL 0   famotidine  (PEPCID ) 20 MG tablet Take 1 tablet (20 mg total) by mouth 2 (two) times daily for 14 days. 28 tablet 0   norgestimate -ethinyl estradiol  (ORTHO-CYCLEN) 0.25-35 MG-MCG tablet Take 1 tablet by mouth daily. 28 tablet 11   triamcinolone  cream (KENALOG ) 0.1 % APPLY TO THE AFFECTED AREA TWICE DAILY TO SCALP AFTER SHAMPOOStrength: 0.1 % 30 g 0   VITAMIN D  PO Take 1 Dose by  mouth daily.     No current facility-administered medications on file prior to visit.    No Known Allergies  Social History   Socioeconomic History   Marital status: Significant Other    Spouse name: Not on file   Number of children: Not on file   Years of education: Not on file   Highest education level: Not on file  Occupational History   Not on file  Tobacco Use   Smoking status: Never   Smokeless tobacco: Never  Vaping Use   Vaping status: Never Used  Substance and Sexual Activity   Alcohol use: No   Drug use: No   Sexual activity: Yes    Birth control/protection: None  Other Topics Concern   Not on file  Social History Narrative   ** Merged History Encounter **       Social Drivers of Health   Financial Resource Strain: Not on file  Food Insecurity: No Food Insecurity (01/01/2023)   Hunger Vital Sign    Worried About Running Out of Food in the Last Year: Never true    Ran Out of Food in the Last Year: Never true  Transportation Needs: No Transportation Needs (01/01/2023)   PRAPARE - Administrator, Civil Service (Medical): No    Lack of Transportation (Non-Medical): No  Physical Activity: Not on file  Stress: Not on file  Social Connections: Not on file  Intimate Partner Violence: Not on file    Family History  Problem Relation Age of Onset   Alcohol abuse  Neg Hx    Arthritis Neg Hx    Asthma Neg Hx    Birth defects Neg Hx    Cancer Neg Hx    COPD Neg Hx    Depression Neg Hx    Diabetes Neg Hx    Drug abuse Neg Hx    Early death Neg Hx    Hearing loss Neg Hx    Heart disease Neg Hx    Hyperlipidemia Neg Hx    Hypertension Neg Hx    Kidney disease Neg Hx    Learning disabilities Neg Hx    Mental illness Neg Hx    Mental retardation Neg Hx    Miscarriages / Stillbirths Neg Hx    Stroke Neg Hx    Vision loss Neg Hx     Past Surgical History:  Procedure Laterality Date   ABDOMINAL SURGERY     c-section   CESAREAN SECTION      CESAREAN SECTION  08/21/2011   Procedure: CESAREAN SECTION;  Surgeon: Ana Balling, MD;  Location: [facility];  Service: Gynecology;  Laterality: N/A;  Primary cesarean section of baby girl  at 0644  APGAR 9/9    ROS: Review of Systems Negative except as stated above  PHYSICAL EXAM: BP 123/81   Pulse 69   Temp 97.9 F (36.6 C) (Oral)   Resp 16   Ht 5' 3.5" (1.613 m)   Wt 255 lb 3.2 oz (115.8 kg)   LMP 03/13/2023   SpO2 98%   BMI 44.50 kg/m   Physical Exam HENT:     Head: Normocephalic and atraumatic.     Nose: Nose normal.     Mouth/Throat:     Mouth: Mucous membranes are moist.     Pharynx: Oropharynx is clear.  Eyes:     Extraocular Movements: Extraocular movements intact.     Conjunctiva/sclera: Conjunctivae normal.     Pupils: Pupils are equal, round, and reactive to light.  Cardiovascular:     Rate and Rhythm: Normal rate and regular rhythm.     Pulses: Normal pulses.     Heart sounds: Normal heart sounds.  Pulmonary:     Effort: Pulmonary effort is normal.     Breath sounds: Normal breath sounds.  Musculoskeletal:        General: Normal range of motion.     Cervical back: Normal range of motion and neck supple.  Neurological:     General: No focal deficit present.     Mental Status: She is alert and oriented to person, place, and time.  Psychiatric:        Mood and Affect: Mood normal.        Behavior: Behavior normal.     ASSESSMENT AND PLAN: 1. Chest pain, unspecified type (Primary) - Patient today in office with no cardiopulmonary/acute distress.  - Patient left without being seen after triage on today 05/15/2023 at [facility] Emergency Department at [facility]. Labs reviewed and unremarkable. - Referral to Cardiology for evaluation/management.  - Ambulatory referral to Cardiology  2. Acute nonintractable headache, unspecified headache type - Keep all scheduled appointments with Neurology.  3. Gastroesophageal reflux disease, unspecified  whether esophagitis present - Continue Famotidine  as prescribed. Counseled on medication adherence/adverse effects. No refills needed as of present.  - Follow-up with primary provider as scheduled.  4. Perennial allergic rhinitis - Cetirizine  as prescribed. Counseled on medication adherence/adverse effects.  - Follow-up with primary provider as scheduled. - cetirizine  (ZYRTEC ) 10 MG tablet;  Take 1 tablet (10 mg total) by mouth daily.  Dispense: 30 tablet; Refill: 2  5. Insomnia, unspecified type - Trazodone  as prescribed. Counseled on medication adherence/adverse effects.  - Follow-up with primary provider as scheduled. - traZODone  (DESYREL ) 50 MG tablet; Take 1 tablet (50 mg total) by mouth at bedtime as needed for sleep.  Dispense: 30 tablet; Refill: 1  6. Language barrier - [facility] in-person interpreter, Ruffin Cotton.   Patient was given the opportunity to ask questions.  Patient verbalized understanding of the plan and was able to repeat key elements of the plan. Patient was given clear instructions to go to Emergency Department or return to medical center if symptoms don't improve, worsen, or new problems develop.The patient verbalized understanding.   Orders Placed This Encounter  Procedures   Ambulatory referral to Cardiology     Requested Prescriptions   Signed Prescriptions Disp Refills   traZODone  (DESYREL ) 50 MG tablet 30 tablet 1    Sig: Take 1 tablet (50 mg total) by mouth at bedtime as needed for sleep.   cetirizine  (ZYRTEC ) 10 MG tablet 30 tablet 2    Sig: Take 1 tablet (10 mg total) by mouth daily.    Follow-up with primary provider as scheduled.  Senaida Dama, NP

## 2023-05-15 NOTE — ED Notes (Signed)
 No answer and moved otf

## 2023-05-18 ENCOUNTER — Ambulatory Visit (INDEPENDENT_AMBULATORY_CARE_PROVIDER_SITE_OTHER): Payer: Self-pay | Admitting: Diagnostic Neuroimaging

## 2023-05-18 ENCOUNTER — Encounter: Payer: Self-pay | Admitting: Diagnostic Neuroimaging

## 2023-05-18 VITALS — Ht 63.3 in | Wt 254.0 lb

## 2023-05-18 DIAGNOSIS — R0683 Snoring: Secondary | ICD-10-CM

## 2023-05-18 DIAGNOSIS — R42 Dizziness and giddiness: Secondary | ICD-10-CM

## 2023-05-18 DIAGNOSIS — R519 Headache, unspecified: Secondary | ICD-10-CM

## 2023-05-18 DIAGNOSIS — G4719 Other hypersomnia: Secondary | ICD-10-CM

## 2023-05-18 NOTE — Progress Notes (Signed)
 Orthostatic Vitals for the past 48 hrs (Last 6 readings):  BP supine (x 5 minutes) HR supine (x 5 minutes) BP standing (after 1 minute) HR standing (after 1 minute) BP standing (after 3 minutes) HR standing (after 3 minutes) Orthostatics Comment Patient Position (if appropriate)  05/18/23 0951 -- -- -- -- -- -- -- Orthostatic Vitals  05/18/23 0952 112/76 70 115/75 75 115/78 79 slightly dizzy upon standing --

## 2023-05-18 NOTE — Progress Notes (Signed)
 [facility]  PATIENT: Sharon Hess DOB: 08/13/1995  REFERRING CLINICIAN: Scarlette Currier, MD HISTORY FROM: patient  REASON FOR VISIT: new consult   HISTORICAL  CHIEF COMPLAINT:  Chief Complaint  Patient presents with   RM 7    Patient is here w/ Spanish interpreter for new referral for HAs and dizziness. Patient reports dizziness and headaches happen at the same time. The dizziness comes first and then the headache follows right after. She also states she is worried because she feels her body is numb. On the 4/16 she had an episode, feeling like her body was empty, weak, dizzy, and was feeling that she was dying & had chest pain. She states she couldn't breathe and started feeling numb in her body and shaking. She went to ER.    HISTORY OF PRESENT ILLNESS:   28 year old female here for evaluation of headache, dizziness.  Symptoms started 05/09/23 when she woke up from sleep, foot could not feel her body, had chest pain shortness of breath.  She went to emergency room for evaluation.  Couple days later she had return to the emergency room for headaches and dizziness.  Had CT of the head and neck which were unremarkable except for small blood vessel bulging of the left internal carotid artery intracranially, likely less than 1 mm infundibulum.  Since that time symptoms have improved.  No headache currently.  Has been having some issues with insomnia and anxiety.  Has some history of snoring and daytime fatigue.  History of polycystic ovarian syndrome and fatty liver disease.  A few weeks prior to onset of symptoms patient's children were sick, had to go to the emergency room, diagnosed with the flu.  One of her children and up with febrile seizures.  Patient herself was tested for flu but was negative.    REVIEW OF SYSTEMS: Full 14 system review of systems performed and negative with exception of: as per HPI.  ALLERGIES: No Known Allergies  HOME  MEDICATIONS: Outpatient Medications Prior to Visit  Medication Sig Dispense Refill   cetirizine  (ZYRTEC ) 10 MG tablet Take 1 tablet (10 mg total) by mouth daily. 30 tablet 2   famotidine  (PEPCID ) 20 MG tablet Take 1 tablet (20 mg total) by mouth 2 (two) times daily for 14 days. 28 tablet 0   traZODone  (DESYREL ) 50 MG tablet Take 1 tablet (50 mg total) by mouth at bedtime as needed for sleep. 30 tablet 1   VITAMIN D  PO Take 1 Dose by mouth daily.     ciprofloxacin -dexamethasone  (CIPRODEX ) OTIC suspension Place 4 drops into both ears 2 (two) times daily. (Patient not taking: Reported on 05/18/2023) 7.5 mL 0   norgestimate -ethinyl estradiol  (ORTHO-CYCLEN) 0.25-35 MG-MCG tablet Take 1 tablet by mouth daily. (Patient not taking: Reported on 05/18/2023) 28 tablet 11   triamcinolone  cream (KENALOG ) 0.1 % APPLY TO THE AFFECTED AREA TWICE DAILY TO SCALP AFTER SHAMPOOStrength: 0.1 % (Patient not taking: Reported on 05/18/2023) 30 g 0   No facility-administered medications prior to visit.    PAST MEDICAL HISTORY: Past Medical History:  Diagnosis Date   Anemia    Bilateral impacted cerumen 07/07/2021   Fatty liver    Hepatitis B    Preeclampsia 2021   Previous cesarean delivery, antepartum condition or complication 02/16/2014   OP arrest at 7 cm--desires TOLAC--consent signed     PAST SURGICAL HISTORY: Past Surgical History:  Procedure Laterality Date   CESAREAN SECTION  08/21/2011   Procedure: CESAREAN SECTION;  Surgeon: Ana Balling, MD;  Location: [facility];  Service: Gynecology;  Laterality: N/A;  Primary cesarean section of baby girl  at 82  APGAR 9/9    FAMILY HISTORY: Family History  Problem Relation Age of Onset   Alcohol abuse Neg Hx    Arthritis Neg Hx    Asthma Neg Hx    Birth defects Neg Hx    Cancer Neg Hx    COPD Neg Hx    Depression Neg Hx    Diabetes Neg Hx    Drug abuse Neg Hx    Early death Neg Hx    Hearing loss Neg Hx    Heart disease Neg Hx    Hyperlipidemia Neg  Hx    Hypertension Neg Hx    Kidney disease Neg Hx    Learning disabilities Neg Hx    Mental illness Neg Hx    Mental retardation Neg Hx    Miscarriages / Stillbirths Neg Hx    Stroke Neg Hx    Vision loss Neg Hx    Migraines Neg Hx     SOCIAL HISTORY: Social History   Socioeconomic History   Marital status: Significant Other    Spouse name: Not on file   Number of children: Not on file   Years of education: Not on file   Highest education level: Not on file  Occupational History   Not on file  Tobacco Use   Smoking status: Never   Smokeless tobacco: Never  Vaping Use   Vaping status: Never Used  Substance and Sexual Activity   Alcohol use: No   Drug use: No   Sexual activity: Yes    Birth control/protection: None  Other Topics Concern   Not on file  Social History Narrative   Right handed   Caffeine: maybe 2-3 times a week   Lives with partner and children         ** Merged History Encounter **    Social Drivers of Corporate investment banker Strain: Not on file  Food Insecurity: No Food Insecurity (01/01/2023)   Hunger Vital Sign    Worried About Running Out of Food in the Last Year: Never true    Ran Out of Food in the Last Year: Never true  Transportation Needs: No Transportation Needs (01/01/2023)   PRAPARE - Administrator, Civil Service (Medical): No    Lack of Transportation (Non-Medical): No  Physical Activity: Not on file  Stress: Not on file  Social Connections: Not on file  Intimate Partner Violence: Not on file     PHYSICAL EXAM  GENERAL EXAM/CONSTITUTIONAL: Vitals:  Vitals:   05/18/23 0951  Weight: 254 lb (115.2 kg)  Height: 5' 3.3" (1.608 m)   Orthostatic Vitals for the past 48 hrs (Last 6 readings):  BP supine (x 5 minutes) HR supine (x 5 minutes) BP standing (after 1 minute) HR standing (after 1 minute) BP standing (after 3 minutes) HR standing (after 3 minutes) Orthostatics Comment Patient Position (if appropriate)   05/18/23 0951 -- -- -- -- -- -- -- Orthostatic Vitals  05/18/23 0952 112/76 70 115/75 75 115/78 79 slightly dizzy upon standing --   Body mass index is 44.57 kg/m. Wt Readings from Last 3 Encounters:  05/18/23 254 lb (115.2 kg)  05/15/23 255 lb 3.2 oz (115.8 kg)  05/15/23 257 lb 0.9 oz (116.6 kg)   Patient is in no distress; well developed, nourished and groomed; neck is supple  CARDIOVASCULAR: Examination of carotid arteries is normal; no carotid bruits Regular rate and rhythm, no murmurs Examination of peripheral vascular system by observation and palpation is normal  EYES: Ophthalmoscopic exam of optic discs and posterior segments is normal; no papilledema or hemorrhages No results found.  MUSCULOSKELETAL: Gait, strength, tone, movements noted in Neurologic exam below  NEUROLOGIC: MENTAL STATUS:      No data to display         awake, alert, oriented to person, place and time recent and remote memory intact normal attention and concentration language fluent, comprehension intact, naming intact fund of knowledge appropriate  CRANIAL NERVE:  2nd - no papilledema on fundoscopic exam 2nd, 3rd, 4th, 6th - pupils equal and reactive to light, visual fields full to confrontation, extraocular muscles intact, no nystagmus 5th - facial sensation symmetric 7th - facial strength symmetric 8th - hearing intact 9th - palate elevates symmetrically, uvula midline 11th - shoulder shrug symmetric 12th - tongue protrusion midline  MOTOR:  normal bulk and tone, full strength in the BUE, BLE  SENSORY:  normal and symmetric to light touch, temperature, vibration  COORDINATION:  finger-nose-finger, fine finger movements normal  REFLEXES:  deep tendon reflexes present and symmetric  GAIT/STATION:  narrow based gait     DIAGNOSTIC DATA (LABS, IMAGING, TESTING) - I reviewed patient records, labs, notes, testing and imaging myself where available.  Lab Results  Component  Value Date   WBC 9.2 05/15/2023   HGB 12.8 05/15/2023   HCT 39.9 05/15/2023   MCV 89.5 05/15/2023   PLT 299 05/15/2023      Component Value Date/Time   NA 137 05/15/2023 0319   NA 139 03/01/2022 1043   K 3.9 05/15/2023 0319   CL 106 05/15/2023 0319   CO2 23 05/15/2023 0319   GLUCOSE 106 (H) 05/15/2023 0319   GLUCOSE 97 01/12/2014 1157   BUN 11 05/15/2023 0319   BUN 11 03/01/2022 1043   CREATININE 0.70 05/15/2023 0319   CREATININE 0.41 (L) 06/24/2014 0933   CALCIUM 9.2 05/15/2023 0319   PROT 7.2 05/12/2023 0753   PROT 7.0 03/01/2022 1043   ALBUMIN 3.6 05/12/2023 0753   ALBUMIN 4.5 03/01/2022 1043   AST 17 05/12/2023 0753   ALT 20 05/12/2023 0753   ALKPHOS 53 05/12/2023 0753   BILITOT 0.9 05/12/2023 0753   BILITOT 0.9 03/01/2022 1043   GFRNONAA >60 05/15/2023 0319   GFRAA 161 05/12/2019 0902   Lab Results  Component Value Date   CHOL 126 03/15/2021   HDL 34 (L) 03/15/2021   LDLCALC 72 03/15/2021   TRIG 105 03/15/2021   CHOLHDL 3.7 03/15/2021   Lab Results  Component Value Date   HGBA1C 5.7 (H) 03/01/2022   No results found for: "VITAMINB12" Lab Results  Component Value Date   TSH 2.356 05/09/2023    05/12/23 CTA head / neck [I reviewed images myself and agree with interpretation. Left ICA finding likely infundibulum (<92mm). -VRP]   1. CTA Head and Neck is negative except for mild ICA tortuosity, and a subtle 1-2 mm distal Left ICA siphon infundibulum (favored) versus aneurysm. Recommend outpatient surveillance with noncontrast Head MRA, and referral to neuro endovascular provider if the lesion enlarges. 2. Normal CT appearance of the brain.   ASSESSMENT AND PLAN  29 y.o. year old female here with:   Dx:  1. Dizziness   2. Nonintractable headache, unspecified chronicity pattern, unspecified headache type   3. Snoring   4. Excessive daytime sleepiness  PLAN:  HEADACHES / NUMBNESS IN BODY / DIZZINESS (since 05/09/23; some stress in previous few  weeks related to children being sick with flu and one child with febrile seizures; some ongoing insomnia) - check MRI brain - check sleep study - tension headache --> ibuprofen  / tylenol  as needed - migraine headache --> consider topiramate / rizatriptan if headaches return  LEFT INTERNAL CAROTID ARTERY FINDING (intracranial) --> mild ICA tortuosity, and a subtle 1-2 mm distal Left ICA siphon infundibulum (favored)  - unlikely aneurysm; recommend to continue vascular risk factor mgmt   Orders Placed This Encounter  Procedures   MR BRAIN W WO CONTRAST   Ambulatory referral to Sleep Studies   Return for pending if symptoms worsen or fail to improve, pending test results.    Omega Bible, MD 05/18/2023, 10:37 AM Certified in Neurology, Neurophysiology and Neuroimaging  [facility] [address] [employer] [place], [place] [address] [phone]

## 2023-05-18 NOTE — Patient Instructions (Addendum)
  HEADACHES / NUMBNESS IN BODY / DIZZINESS (since 05/09/23; some stress in previous few weeks related to children being sick with flu and one child with febrile seizures; some ongoing insomnia) - check MRI brain - check sleep study - tension headache --> ibuprofen  / tylenol  as needed - migraine headache --> consider topiramate / rizatriptan if headaches return  LEFT INTERNAL CAROTID ARTERY FINDING (intracranial) --> mild ICA tortuosity, and a subtle 1-2 mm distal Left ICA siphon infundibulum (favored)  - unlikely aneurysm; recommend to continue vascular risk factor mgmt

## 2023-05-21 ENCOUNTER — Telehealth: Payer: Self-pay | Admitting: Diagnostic Neuroimaging

## 2023-05-21 NOTE — Telephone Encounter (Signed)
 MRI order was sent to GI [phone]

## 2023-05-27 ENCOUNTER — Emergency Department (HOSPITAL_COMMUNITY): Payer: Self-pay

## 2023-05-27 ENCOUNTER — Encounter (HOSPITAL_COMMUNITY): Payer: Self-pay

## 2023-05-27 ENCOUNTER — Emergency Department (HOSPITAL_COMMUNITY)
Admission: EM | Admit: 2023-05-27 | Discharge: 2023-05-28 | Disposition: A | Discharge status: Home / Self Care | Payer: Self-pay | Attending: Emergency Medicine | Admitting: Emergency Medicine

## 2023-05-27 ENCOUNTER — Other Ambulatory Visit: Payer: Self-pay

## 2023-05-27 ENCOUNTER — Encounter (HOSPITAL_COMMUNITY): Payer: Self-pay | Admitting: Emergency Medicine

## 2023-05-27 ENCOUNTER — Emergency Department (HOSPITAL_COMMUNITY)
Admission: EM | Admit: 2023-05-27 | Discharge: 2023-05-27 | Disposition: A | Discharge status: Home / Self Care | Payer: Self-pay | Attending: Emergency Medicine | Admitting: Emergency Medicine

## 2023-05-27 DIAGNOSIS — R519 Headache, unspecified: Secondary | ICD-10-CM

## 2023-05-27 DIAGNOSIS — R42 Dizziness and giddiness: Secondary | ICD-10-CM | POA: Insufficient documentation

## 2023-05-27 DIAGNOSIS — J020 Streptococcal pharyngitis: Secondary | ICD-10-CM | POA: Insufficient documentation

## 2023-05-27 LAB — CBC
HCT: 40 % (ref 36.0–46.0)
Hemoglobin: 12.9 g/dL (ref 12.0–15.0)
MCH: 29 pg (ref 26.0–34.0)
MCHC: 32.3 g/dL (ref 30.0–36.0)
MCV: 89.9 fL (ref 80.0–100.0)
Platelets: 318 10*3/uL (ref 150–400)
RBC: 4.45 MIL/uL (ref 3.87–5.11)
RDW: 13.2 % (ref 11.5–15.5)
WBC: 9.1 10*3/uL (ref 4.0–10.5)
nRBC: 0 % (ref 0.0–0.2)

## 2023-05-27 LAB — URINALYSIS, ROUTINE W REFLEX MICROSCOPIC
Bacteria, UA: NONE SEEN
Bilirubin Urine: NEGATIVE
Glucose, UA: NEGATIVE mg/dL
Ketones, ur: NEGATIVE mg/dL
Leukocytes,Ua: NEGATIVE
Nitrite: NEGATIVE
Protein, ur: NEGATIVE mg/dL
Specific Gravity, Urine: 1.024 (ref 1.005–1.030)
pH: 6 (ref 5.0–8.0)

## 2023-05-27 LAB — COMPREHENSIVE METABOLIC PANEL WITH GFR
ALT: 22 U/L (ref 0–44)
AST: 17 U/L (ref 15–41)
Albumin: 4.1 g/dL (ref 3.5–5.0)
Alkaline Phosphatase: 53 U/L (ref 38–126)
Anion gap: 11 (ref 5–15)
BUN: 12 mg/dL (ref 6–20)
CO2: 24 mmol/L (ref 22–32)
Calcium: 9.5 mg/dL (ref 8.9–10.3)
Chloride: 105 mmol/L (ref 98–111)
Creatinine, Ser: 0.77 mg/dL (ref 0.44–1.00)
GFR, Estimated: >60 mL/min (ref >60)
Glucose, Bld: 92 mg/dL (ref 70–99)
Potassium: 3.8 mmol/L (ref 3.5–5.1)
Sodium: 140 mmol/L (ref 135–145)
Total Bilirubin: 1.3 mg/dL — ABNORMAL HIGH (ref 0.0–1.2)
Total Protein: 7.1 g/dL (ref 6.5–8.1)

## 2023-05-27 LAB — HCG, SERUM, QUALITATIVE: Preg, Serum: NEGATIVE

## 2023-05-27 LAB — GROUP A STREP BY PCR: Group A Strep by PCR: DETECTED — AB

## 2023-05-27 MED ORDER — LIDOCAINE VISCOUS HCL 2 % MT SOLN: 15.0000 mL | OROMUCOSAL | 0 refills | Status: AC | PRN

## 2023-05-27 MED ORDER — DIPHENHYDRAMINE HCL 50 MG/ML IJ SOLN
12.5000 mg | Freq: Once | INTRAMUSCULAR | Status: AC
  Administered 2023-05-27: 12.5 mg via INTRAVENOUS
  Filled 2023-05-27: qty 1

## 2023-05-27 MED ORDER — HYDROXYZINE HCL 25 MG PO TABS
25.0000 mg | ORAL_TABLET | Freq: Once | ORAL | Status: AC
  Administered 2023-05-27: 25 mg via ORAL
  Filled 2023-05-27: qty 1

## 2023-05-27 MED ORDER — ONDANSETRON HCL 4 MG/2ML IJ SOLN
4.0000 mg | Freq: Once | INTRAMUSCULAR | Status: AC
  Administered 2023-05-27: 4 mg via INTRAVENOUS
  Filled 2023-05-27: qty 2

## 2023-05-27 MED ORDER — KETOROLAC TROMETHAMINE 15 MG/ML IJ SOLN
15.0000 mg | Freq: Once | INTRAMUSCULAR | Status: AC
  Administered 2023-05-27: 15 mg via INTRAVENOUS
  Filled 2023-05-27: qty 1

## 2023-05-27 MED ORDER — ACETAMINOPHEN 500 MG PO TABS: 500.0000 mg | ORAL_TABLET | Freq: Four times a day (QID) | ORAL | 0 refills | Status: AC | PRN

## 2023-05-27 MED ORDER — PROCHLORPERAZINE EDISYLATE 10 MG/2ML IJ SOLN
10.0000 mg | Freq: Once | INTRAMUSCULAR | Status: AC
  Administered 2023-05-27: 10 mg via INTRAVENOUS
  Filled 2023-05-27: qty 2

## 2023-05-27 MED ORDER — LORAZEPAM 1 MG PO TABS
1.0000 mg | ORAL_TABLET | Freq: Once | ORAL | Status: AC
  Administered 2023-05-27: 1 mg via ORAL
  Filled 2023-05-27: qty 1

## 2023-05-27 MED ORDER — SODIUM CHLORIDE 0.9 % IV BOLUS
1000.0000 mL | Freq: Once | INTRAVENOUS | Status: AC
  Administered 2023-05-27: 1000 mL via INTRAVENOUS

## 2023-05-27 MED ORDER — PENICILLIN G BENZATHINE 1200000 UNIT/2ML IM SUSY
1.2000 10*6.[IU] | PREFILLED_SYRINGE | Freq: Once | INTRAMUSCULAR | Status: AC
  Administered 2023-05-27: 1.2 10*6.[IU] via INTRAMUSCULAR
  Filled 2023-05-27: qty 2

## 2023-05-27 MED ORDER — GADOBUTROL 1 MMOL/ML IV SOLN
10.0000 mL | Freq: Once | INTRAVENOUS | Status: AC | PRN
  Administered 2023-05-27: 10 mL via INTRAVENOUS

## 2023-05-27 NOTE — ED Provider Notes (Signed)
 Sharon Hess EMERGENCY DEPARTMENT AT [facility] Provider Note   CSN: [id number] Arrival date & time: 05/27/23  0046     History  Chief Complaint  Patient presents with   Sore Throat    Sharon Hess is a 28 y.o. female.  The history is provided by the patient and medical records. The history is limited by a language barrier. A language interpreter was used.  Sore Throat     28 year old Hispanic female history of hepatitis, PCOS, obesity, brought here via EMS with complaints of sore throat.  History obtained using language interpreter.  Patient states tonight she endorsed having sore throat, trouble breathing, feeling anxious, having chills, as well as some nausea.  States her blood pressure was high. symptoms started about an hour ago.  No specific treatment tried.  No runny nose sneezing or coughing.  No history of thyroid  disease.  Home Medications Prior to Admission medications   Medication Sig Start Date End Date Taking? Authorizing Provider  cetirizine  (ZYRTEC ) 10 MG tablet Take 1 tablet (10 mg total) by mouth daily. 05/15/23   Senaida Dama, NP  famotidine  (PEPCID ) 20 MG tablet Take 1 tablet (20 mg total) by mouth 2 (two) times daily for 14 days. 05/09/23 05/23/23  Ranburne Bureau, PA-C  norgestimate -ethinyl estradiol  (ORTHO-CYCLEN) 0.25-35 MG-MCG tablet Take 1 tablet by mouth daily. Patient not taking: Reported on 05/18/2023 09/07/22   Vernell Goldsmith, MD  traZODone  (DESYREL ) 50 MG tablet Take 1 tablet (50 mg total) by mouth at bedtime as needed for sleep. 05/15/23   Senaida Dama, NP  VITAMIN D  PO Take 1 Dose by mouth daily.    [provider]      Allergies    Patient has no known allergies.    Review of Systems   Review of Systems  All other systems reviewed and are negative.   Physical Exam Updated Vital Signs BP 122/74 (BP Location: Right Arm)   Pulse 83   Temp 98.2 F (36.8 C) (Oral)   Resp 16   LMP 03/13/2023  (Approximate)   SpO2 98%  Physical Exam Vitals and nursing note reviewed.  Constitutional:      General: She is not in acute distress.    Appearance: She is well-developed.  HENT:     Head: Atraumatic.     Mouth/Throat:     Comments: Bilateral tonsillar enlargement without exudates.  No posterior oropharyngeal erythema.  Uvula midline Eyes:     Conjunctiva/sclera: Conjunctivae normal.  Neck:     Vascular: No carotid bruit.     Comments: No thyromegaly Cardiovascular:     Rate and Rhythm: Normal rate and regular rhythm.     Pulses: Normal pulses.     Heart sounds: Normal heart sounds.  Pulmonary:     Effort: Pulmonary effort is normal.     Breath sounds: No wheezing, rhonchi or rales.  Abdominal:     Palpations: Abdomen is soft.  Musculoskeletal:     Cervical back: Normal range of motion and neck supple. No rigidity or tenderness.  Lymphadenopathy:     Cervical: No cervical adenopathy.  Skin:    Findings: No rash.  Neurological:     Mental Status: She is alert.  Psychiatric:        Mood and Affect: Mood normal.     ED Results / Procedures / Treatments   Labs (all labs ordered are listed, but only abnormal results are displayed) Labs Reviewed  GROUP A  STREP BY PCR - Abnormal; Notable for the following components:      Result Value   Group A Strep by PCR DETECTED (*)    All other components within normal limits    EKG None  Radiology No results found.  Procedures Procedures    Medications Ordered in ED Medications - No data to display  ED Course/ Medical Decision Making/ A&P                                 Medical Decision Making Risk OTC drugs. Prescription drug management.   LMP 03/13/2023 (Approximate)   37:19 AM   28 year old Hispanic female history of hepatitis, PCOS, obesity, brought here via EMS with complaints of sore throat.  History obtained using language interpreter.  Patient states tonight she endorsed having sore throat, trouble  breathing, feeling anxious, having chills, as well as some nausea.  Symptoms started about an hour ago.  No specific treatment tried.  No runny nose sneezing or coughing.  No history of thyroid  disease.  On exam patient is resting comfortably smiling interactive with family at bedside appears to be in no acute respiratory discomfort.  Throat exam notable for bilateral tonsillar enlargement without exudates or erythema.  Normal phonation.  Neck exam unremarkable no thyromegaly.  Heart with normal rate and rhythm, lungs are clear to auscultation bilaterally abdomen soft nontender  Lab obtained and positive for group A strep.  Findings consistent with patient presentation.  No evidence of peritonsillar abscess.  Low suspicion for retropharyngeal abscess or other deep tissue infection.  Imaging including neck CT soft tissue considered but not performed.  Patient given Bicillin IM as treatment.          Final Clinical Impression(s) / ED Diagnoses Final diagnoses:  Strep pharyngitis    Rx / DC Orders ED Discharge Orders          Ordered    lidocaine  (XYLOCAINE ) 2 % solution  As needed        05/27/23 0221    acetaminophen  (TYLENOL ) 500 MG tablet  Every 6 hours PRN        05/27/23 0221              Debbra Fairy, PA-C 05/27/23 0224    Earma Gloss, MD 05/27/23 [phone]

## 2023-05-27 NOTE — Discharge Instructions (Signed)
 Fue un placer atenderle hoy.  Su evaluacin de hoy fue tranquilizadora.  Sus resonancias magnticas se realizaron hoy. Por favor, llame a su neurlogo maana e infrmele que se realizaron en urgencias.  Aumente la ingesta de lquidos en casa. Tylenol  y Motrin  en casa.  Regrese si presenta sntomas nuevos o empeora.

## 2023-05-27 NOTE — ED Triage Notes (Signed)
 Pt arrives w/ [facility] w/ c/o sore throat beginning at midnight.

## 2023-05-27 NOTE — ED Triage Notes (Signed)
 Spanish interpreter used for triage:  Pt states she was picked up by an ambulance last night and taken to the hospital because her BP was high and she felt dizzy. She was told she had a throat infection.    Pt states she felt dizzy and weak again today so she took her Bp at [employer] and it was 141/91. Pt currently c.o headache, weakness, sore throat and generalized weakness.

## 2023-05-27 NOTE — ED Provider Notes (Signed)
 [facility] Provider Note   CSN: [id number] Arrival date & time: 05/27/23  1649     History  Chief Complaint  Patient presents with   Weakness   Dizziness    Colon Dear Salvatrice Supple is a 28 y.o. female here for evaluation of headache and dizziness.  Has had recurrent episodes of this previously.  Followed by neurology.  She is post to get an MRI.  She was seen yesterday and diagnosed with strep pharyngitis.  Still has a sore throat however has improved.  Some nausea without vomiting.  States when she gets her headache and dizzy episodes she gets bilateral blurred vision.  She has a difficult time describing this.  No pain with eye movement.  No visual field cuts.  No syncope.  No neck pain, chest pain, shortness of breath, abdominal pain, dysuria or hematuria.  Not taking medications at home.  She states she noted her blood pressure was elevated today when her symptoms occurred.  She had a CTA in April for similar symptoms  HPI     Home Medications Prior to Admission medications   Medication Sig Start Date End Date Taking? Authorizing Provider  acetaminophen  (TYLENOL ) 500 MG tablet Take 1 tablet (500 mg total) by mouth every 6 (six) hours as needed. 05/27/23   Debbra Fairy, PA-C  cetirizine  (ZYRTEC ) 10 MG tablet Take 1 tablet (10 mg total) by mouth daily. 05/15/23   Senaida Dama, NP  famotidine  (PEPCID ) 20 MG tablet Take 1 tablet (20 mg total) by mouth 2 (two) times daily for 14 days. 05/09/23 05/23/23   Bureau, PA-C  lidocaine  (XYLOCAINE ) 2 % solution Use as directed 15 mLs in the mouth or throat as needed for mouth pain. 05/27/23   Debbra Fairy, PA-C  norgestimate -ethinyl estradiol  (ORTHO-CYCLEN) 0.25-35 MG-MCG tablet Take 1 tablet by mouth daily. Patient not taking: Reported on 05/18/2023 09/07/22   Vernell Goldsmith, MD  traZODone  (DESYREL ) 50 MG tablet Take 1 tablet (50 mg total) by mouth at bedtime as needed for sleep. 05/15/23    Senaida Dama, NP  VITAMIN D  PO Take 1 Dose by mouth daily.    [provider]      Allergies    Patient has no known allergies.    Review of Systems   Review of Systems  Constitutional: Negative.   HENT:  Positive for sore throat. Negative for tinnitus, trouble swallowing and voice change.   Respiratory: Negative.    Cardiovascular: Negative.   Gastrointestinal:  Positive for nausea. Negative for abdominal pain, constipation, diarrhea and vomiting.  Genitourinary: Negative.   Musculoskeletal: Negative.  Negative for myalgias.  Skin: Negative.   Neurological:  Positive for dizziness, weakness, light-headedness and headaches. Negative for tremors, seizures, syncope, facial asymmetry, speech difficulty and numbness.  All other systems reviewed and are negative.   Physical Exam Updated Vital Signs BP 131/85 (BP Location: Left Arm)   Pulse 96   Temp 97.8 F (36.6 C) (Oral)   Resp 20   LMP 05/22/2023 (Approximate)   SpO2 100%  Physical Exam Vitals and nursing note reviewed.  Constitutional:      General: She is not in acute distress.    Appearance: She is well-developed. She is not ill-appearing, toxic-appearing or diaphoretic.  HENT:     Head: Normocephalic and atraumatic.     Nose: Nose normal.     Mouth/Throat:     Mouth: Mucous membranes are moist.  Comments: Tonsillar edema BIL, uvula midline, no pooling of secretions. No hot potato voice Eyes:     Pupils: Pupils are equal, round, and reactive to light.     Comments: No nystagmus  Cardiovascular:     Rate and Rhythm: Normal rate.     Pulses: Normal pulses.     Heart sounds: Normal heart sounds.  Pulmonary:     Effort: Pulmonary effort is normal. No respiratory distress.     Breath sounds: Normal breath sounds.  Abdominal:     General: Bowel sounds are normal. There is no distension.     Palpations: Abdomen is soft.     Tenderness: There is no abdominal tenderness. There is no guarding or rebound.   Musculoskeletal:        General: No swelling, tenderness or deformity. Normal range of motion.     Cervical back: Normal range of motion and neck supple.     Right lower leg: No edema.     Left lower leg: No edema.  Skin:    General: Skin is warm and dry.     Capillary Refill: Capillary refill takes less than 2 seconds.  Neurological:     General: No focal deficit present.     Mental Status: She is alert.     Comments: CN 2-12 grossly intact Neg heel to shin Neg F2N Neg drift Ambulatory without ataxia  Psychiatric:        Mood and Affect: Mood normal.    ED Results / Procedures / Treatments   Labs (all labs ordered are listed, but only abnormal results are displayed) Labs Reviewed  COMPREHENSIVE METABOLIC PANEL WITH GFR - Abnormal; Notable for the following components:      Result Value   Total Bilirubin 1.3 (*)    All other components within normal limits  URINALYSIS, ROUTINE W REFLEX MICROSCOPIC - Abnormal; Notable for the following components:   APPearance HAZY (*)    Hgb urine dipstick SMALL (*)    All other components within normal limits  CBC  HCG, SERUM, QUALITATIVE    EKG None  Radiology MR Brain W and Wo Contrast Result Date: 05/27/2023 CLINICAL DATA:  Optic neuritis suspected EXAM: MRI HEAD AND ORBITS WITHOUT AND WITH CONTRAST TECHNIQUE: Multiplanar, multiecho pulse sequences of the brain and surrounding structures were obtained without and with intravenous contrast. Multiplanar, multiecho pulse sequences of the orbits and surrounding structures were obtained including fat saturation techniques, before and after intravenous contrast administration. CONTRAST:  10mL GADAVIST  GADOBUTROL  1 MMOL/ML IV SOLN COMPARISON:  None Available. FINDINGS: MRI HEAD FINDINGS Brain: No acute infarction, hemorrhage, hydrocephalus, extra-axial collection or mass lesion. Normal white matter. No pathologic enhancement. Vascular: Major arterial flow voids are maintained skull base. Skull  and upper cervical spine: Normal marrow signal. Other: No mastoid effusions. MRI ORBITS FINDINGS Orbits: No traumatic or inflammatory finding. Globes, optic nerves, orbital fat, extraocular muscles, vascular structures, and lacrimal glands are normal. No abnormal enhancement. Visualized sinuses: Inferior left maxillary sinus retention cyst. Otherwise, clear. Soft tissues: Negative. IMPRESSION: Normal MRI of the brain and orbits. Electronically Signed   By: Stevenson Elbe M.D.   On: 05/27/2023 23:44   MR ORBITS W WO CONTRAST Result Date: 05/27/2023 CLINICAL DATA:  Optic neuritis suspected EXAM: MRI HEAD AND ORBITS WITHOUT AND WITH CONTRAST TECHNIQUE: Multiplanar, multiecho pulse sequences of the brain and surrounding structures were obtained without and with intravenous contrast. Multiplanar, multiecho pulse sequences of the orbits and surrounding structures were obtained including fat saturation  techniques, before and after intravenous contrast administration. CONTRAST:  10mL GADAVIST  GADOBUTROL  1 MMOL/ML IV SOLN COMPARISON:  None Available. FINDINGS: MRI HEAD FINDINGS Brain: No acute infarction, hemorrhage, hydrocephalus, extra-axial collection or mass lesion. Normal white matter. No pathologic enhancement. Vascular: Major arterial flow voids are maintained skull base. Skull and upper cervical spine: Normal marrow signal. Other: No mastoid effusions. MRI ORBITS FINDINGS Orbits: No traumatic or inflammatory finding. Globes, optic nerves, orbital fat, extraocular muscles, vascular structures, and lacrimal glands are normal. No abnormal enhancement. Visualized sinuses: Inferior left maxillary sinus retention cyst. Otherwise, clear. Soft tissues: Negative. IMPRESSION: Normal MRI of the brain and orbits. Electronically Signed   By: Stevenson Elbe M.D.   On: 05/27/2023 23:44    Procedures Procedures    Medications Ordered in ED Medications  ketorolac  (TORADOL ) 15 MG/ML injection 15 mg (15 mg Intravenous  Given 05/27/23 1920)  prochlorperazine  (COMPAZINE ) injection 10 mg (10 mg Intravenous Given 05/27/23 1922)  diphenhydrAMINE  (BENADRYL ) injection 12.5 mg (12.5 mg Intravenous Given 05/27/23 1919)  sodium chloride  0.9 % bolus 1,000 mL (1,000 mLs Intravenous New Bag/Given 05/27/23 1917)  ondansetron  (ZOFRAN ) injection 4 mg (4 mg Intravenous Given 05/27/23 2051)  LORazepam  (ATIVAN ) tablet 1 mg (1 mg Oral Given 05/27/23 2208)  gadobutrol  (GADAVIST ) 1 MMOL/ML injection 10 mL (10 mLs Intravenous Contrast Given 05/27/23 2301)    ED Course/ Medical Decision Making/ A&P Clinical Course as of 05/28/23 0003  Sun May 27, 2023  2027 Emesis, unable to lay flat in MRI, sent back to ED.  Will trial antiemetic. [BH]    Clinical Course User Index [BH] Lataysha Vohra A, PA-C   28 year old here for headache, dizziness.  Had similar symptoms in the past.  Followed with neurology associate outpatient MRI.  Has felt unwell since yesterday.  Was seen in the ED and diagnosed with strep pharyngitis prescribed Bicillin .  She has had some nausea since.  Still has a headache, dizziness.  She has no focal deficits on exam.  Will give her IV fluids, migraine cocktail.  Will plan on checking labs.  Will tend to get her MRI here today to r/o demyelinating process as cause of her symptoms.  Labs and imaging personally viewed and interpreted:  CBC without leukocytosis UA negative for infection Preg negative Metabolic panel T. bili 1.3  Patient reassessed.  Headache improved.  They did time to take her to MRI however she became nauseous, had panic attack in the scanner.  They subsequently brought her here.  She was given Zofran .  Will attempt MRI with Ativan .  Reviewed her CTA from prior visit which shows infundibulum, low suspicion for aneurysm however did recommend yearly surveillance with MRA for change.  MRIs personally viewed interpreted.   Discussed results with patient.  Will have her follow-up with neurology.  The patient  has been appropriately medically screened and/or stabilized in the ED. I have low suspicion for any other emergent medical condition which would require further screening, evaluation or treatment in the ED or require inpatient management.  Patient is hemodynamically stable and in no acute distress.  Patient able to ambulate in department prior to ED.  Evaluation does not show acute pathology that would require ongoing or additional emergent interventions while in the emergency department or further inpatient treatment.  I have discussed the diagnosis with the patient and answered all questions.  Pain is been managed while in the emergency department and patient has no further complaints prior to discharge.  Patient is comfortable with plan discussed  in room and is stable for discharge at this time.  I have discussed strict return precautions for returning to the emergency department.  Patient was encouraged to follow-up with PCP/specialist refer to at discharge.                                Medical Decision Making Amount and/or Complexity of Data Reviewed Independent Historian: spouse External Data Reviewed: labs, radiology, ECG and notes. Labs: ordered. Decision-making details documented in ED Course. Radiology: ordered and independent interpretation performed. Decision-making details documented in ED Course.  Risk OTC drugs. Prescription drug management. Parenteral controlled substances. Decision regarding hospitalization. Diagnosis or treatment significantly limited by social determinants of health.          Final Clinical Impression(s) / ED Diagnoses Final diagnoses:  Strep pharyngitis  Acute nonintractable headache, unspecified headache type    Rx / DC Orders ED Discharge Orders     None         Leotha Voeltz A, PA-C 05/28/23 0003    Almond Army, MD 06/01/23 0008

## 2023-05-28 ENCOUNTER — Other Ambulatory Visit: Payer: Self-pay

## 2023-05-28 ENCOUNTER — Encounter: Payer: Self-pay | Admitting: Nurse Practitioner

## 2023-05-28 ENCOUNTER — Ambulatory Visit: Payer: Self-pay | Attending: Nurse Practitioner | Admitting: Nurse Practitioner

## 2023-05-28 VITALS — BP 134/82 | HR 95 | Resp 18 | Ht 63.3 in | Wt 251.8 lb

## 2023-05-28 DIAGNOSIS — F418 Other specified anxiety disorders: Secondary | ICD-10-CM

## 2023-05-28 DIAGNOSIS — G4700 Insomnia, unspecified: Secondary | ICD-10-CM

## 2023-05-28 DIAGNOSIS — I10 Essential (primary) hypertension: Secondary | ICD-10-CM

## 2023-05-28 DIAGNOSIS — Z6841 Body Mass Index (BMI) 40.0 and over, adult: Secondary | ICD-10-CM

## 2023-05-28 DIAGNOSIS — R7303 Prediabetes: Secondary | ICD-10-CM

## 2023-05-28 DIAGNOSIS — F32A Depression, unspecified: Secondary | ICD-10-CM

## 2023-05-28 MED ORDER — TRAZODONE HCL 50 MG PO TABS
50.0000 mg | ORAL_TABLET | Freq: Every evening | ORAL | 1 refills | Status: AC | PRN
  Filled 2023-05-28: qty 90, 90d supply, fill #0

## 2023-05-28 MED ORDER — HYDROCHLOROTHIAZIDE 25 MG PO TABS
25.0000 mg | ORAL_TABLET | Freq: Every day | ORAL | 3 refills | Status: DC
  Filled 2023-05-28: qty 90, 90d supply, fill #0

## 2023-05-28 MED ORDER — HYDROCHLOROTHIAZIDE 25 MG PO TABS
25.0000 mg | ORAL_TABLET | Freq: Every day | ORAL | 3 refills | Status: AC
Start: 2023-05-28 — End: ?
  Filled 2023-05-28: qty 90, 90d supply, fill #0
  Filled 2023-11-12 (×2): qty 90, 90d supply, fill #1

## 2023-05-28 MED ORDER — FLUOXETINE HCL 20 MG PO TABS
20.0000 mg | ORAL_TABLET | Freq: Every day | ORAL | 3 refills | Status: AC
  Filled 2023-05-28: qty 90, 90d supply, fill #0
  Filled 2023-11-12 (×2): qty 90, 90d supply, fill #1

## 2023-05-28 NOTE — Progress Notes (Signed)
 Assessment & Plan:  Sharon Hess was seen today for hypertension.  Diagnoses and all orders for this visit:  Primary hypertension -     hydrochlorothiazide (HYDRODIURIL) 25 MG tablet; Take 1 tablet (25 mg total) by mouth daily. FOR BLOOD PRESSURE Continue all antihypertensives as prescribed.  Reminded to bring in blood pressure log for follow  up appointment.  RECOMMENDATIONS: DASH/Mediterranean Diets are healthier choices for HTN.    Anxiety and depression -     Ambulatory referral to Behavioral Health -     FLUoxetine (PROZAC) 20 MG tablet; Take 1 tablet (20 mg total) by mouth daily. Anxiety and depression  Morbid obesity with BMI of 40.0-44.9, adult (HCC) -     Lipid panel  Prediabetes -     Hemoglobin A1c  Insomnia, unspecified type -     traZODone  (DESYREL ) 50 MG tablet; Take 1 tablet (50 mg total) by mouth at bedtime as needed for sleep.    Patient has been counseled on age-appropriate routine health concerns for screening and prevention. These are reviewed and up-to-date. Referrals have been placed accordingly. Immunizations are up-to-date or declined.    Subjective:   Chief Complaint  Patient presents with   Hypertension    Sharon Hess 28 y.o. female presents to office today for concerns of high blood pressure readings at home.   VRI was used to communicate directly with patient for the entire encounter including providing detailed patient instructions.     She is stage 1 HTN today. She has readings on her phone today however the readings say high and normal but no actual readings on her phone from her device. BMI 44.18 BP Readings from Last 3 Encounters:  05/28/23 134/82  05/28/23 131/74  05/27/23 122/74    She is having thoughts of harming herself with no plan. No active thoughts today. She started having thoughts of suicide 2 months ago. No triggering factors. States her children were sick with URIs a few months ago as well and she was worried about  them.      05/28/2023    8:47 AM 01/01/2023   10:15 AM 09/07/2022    4:04 PM  Depression screen PHQ 2/9  Decreased Interest 3 0 1  Down, Depressed, Hopeless 3 0 0  PHQ - 2 Score 6 0 1  Altered sleeping 3 0 0  Tired, decreased energy 0 1 1  Change in appetite 0 1 1  Feeling bad or failure about yourself  2 0 0  Trouble concentrating 2 1 0  Moving slowly or fidgety/restless 2 0 0  Suicidal thoughts 2 0 0  PHQ-9 Score 17 3 3   Difficult doing work/chores Extremely dIfficult         05/28/2023    8:48 AM 01/01/2023   10:15 AM 09/07/2022    4:04 PM 03/01/2022   11:55 AM  GAD 7 : Generalized Anxiety Score  Nervous, Anxious, on Edge 3 0 1 1  Control/stop worrying 3 0 0 0  Worry too much - different things 3 0 0 --  Trouble relaxing 3 0 0 0  Restless 3 0 0 0  Easily annoyed or irritable 3 1 0 0  Afraid - awful might happen 3 0 0 2  Total GAD 7 Score 21 1 1    Anxiety Difficulty Somewhat difficult            Review of Systems  Constitutional:  Negative for fever, malaise/fatigue and weight loss.  HENT: Negative.  Negative  for nosebleeds.   Eyes: Negative.  Negative for blurred vision, double vision and photophobia.  Respiratory: Negative.  Negative for cough and shortness of breath.   Cardiovascular: Negative.  Negative for chest pain, palpitations and leg swelling.  Gastrointestinal: Negative.  Negative for heartburn, nausea and vomiting.  Musculoskeletal: Negative.  Negative for myalgias.  Neurological: Negative.  Negative for dizziness, focal weakness, seizures and headaches.  Psychiatric/Behavioral:  Positive for depression. Negative for suicidal ideas. The patient is nervous/anxious.     Past Medical History:  Diagnosis Date   Anemia    Bilateral impacted cerumen 07/07/2021   Fatty liver    Hepatitis B    Preeclampsia 2021   Previous cesarean delivery, antepartum condition or complication 02/16/2014   OP arrest at 7 cm--desires TOLAC--consent signed     Past Surgical  History:  Procedure Laterality Date   CESAREAN SECTION  08/21/2011   Procedure: CESAREAN SECTION;  Surgeon: Ana Balling, MD;  Location: [facility];  Service: Gynecology;  Laterality: N/A;  Primary cesarean section of baby girl  at 32  APGAR 9/9    Family History  Problem Relation Age of Onset   Alcohol abuse Neg Hx    Arthritis Neg Hx    Asthma Neg Hx    Birth defects Neg Hx    Cancer Neg Hx    COPD Neg Hx    Depression Neg Hx    Diabetes Neg Hx    Drug abuse Neg Hx    Early death Neg Hx    Hearing loss Neg Hx    Heart disease Neg Hx    Hyperlipidemia Neg Hx    Hypertension Neg Hx    Kidney disease Neg Hx    Learning disabilities Neg Hx    Mental illness Neg Hx    Mental retardation Neg Hx    Miscarriages / Stillbirths Neg Hx    Stroke Neg Hx    Vision loss Neg Hx    Migraines Neg Hx     Social History Reviewed with no changes to be made today.   Outpatient Medications Prior to Visit  Medication Sig Dispense Refill   acetaminophen  (TYLENOL ) 500 MG tablet Take 1 tablet (500 mg total) by mouth every 6 (six) hours as needed. 30 tablet 0   cetirizine  (ZYRTEC ) 10 MG tablet Take 1 tablet (10 mg total) by mouth daily. 30 tablet 2   famotidine  (PEPCID ) 20 MG tablet Take 1 tablet (20 mg total) by mouth 2 (two) times daily for 14 days. 28 tablet 0   lidocaine  (XYLOCAINE ) 2 % solution Use as directed 15 mLs in the mouth or throat as needed for mouth pain. 150 mL 0   norgestimate -ethinyl estradiol  (ORTHO-CYCLEN) 0.25-35 MG-MCG tablet Take 1 tablet by mouth daily. (Patient not taking: Reported on 05/18/2023) 28 tablet 11   VITAMIN D  PO Take 1 Dose by mouth daily.     traZODone  (DESYREL ) 50 MG tablet Take 1 tablet (50 mg total) by mouth at bedtime as needed for sleep. 30 tablet 1   No facility-administered medications prior to visit.    No Known Allergies     Objective:    BP 134/82 (BP Location: Left Arm, Patient Position: Sitting, Cuff Size: Normal)   Pulse 95   Resp 18   Ht  5' 3.3" (1.608 m)   Wt 251 lb 12.8 oz (114.2 kg)   LMP 05/22/2023 (Approximate)   SpO2 100%   BMI 44.18 kg/m  Wt Readings from Last 3  Encounters:  05/28/23 251 lb 12.8 oz (114.2 kg)  05/18/23 254 lb (115.2 kg)  05/15/23 255 lb 3.2 oz (115.8 kg)    Physical Exam Vitals and nursing note reviewed.  Constitutional:      Appearance: She is well-developed.  HENT:     Head: Normocephalic and atraumatic.  Cardiovascular:     Rate and Rhythm: Normal rate and regular rhythm.     Heart sounds: Normal heart sounds. No murmur heard.    No friction rub. No gallop.  Pulmonary:     Effort: Pulmonary effort is normal. No tachypnea or respiratory distress.     Breath sounds: Normal breath sounds. No decreased breath sounds, wheezing, rhonchi or rales.  Chest:     Chest wall: No tenderness.  Abdominal:     General: Bowel sounds are normal.     Palpations: Abdomen is soft.  Musculoskeletal:        General: Normal range of motion.     Cervical back: Normal range of motion.  Skin:    General: Skin is warm and dry.  Neurological:     Mental Status: She is alert and oriented to person, place, and time.     Coordination: Coordination normal.  Psychiatric:        Behavior: Behavior normal. Behavior is cooperative.        Thought Content: Thought content normal.        Judgment: Judgment normal.          Patient has been counseled extensively about nutrition and exercise as well as the importance of adherence with medications and regular follow-up. The patient was given clear instructions to go to ER or return to medical center if symptoms don't improve, worsen or new problems develop. The patient verbalized understanding.   Follow-up: Return in about 4 weeks (around 06/25/2023) for BP recheck.   Collins Dean, FNP-BC [facility] [place], [place] [phone]   05/28/2023, 9:15 AM

## 2023-05-29 ENCOUNTER — Encounter: Payer: Self-pay | Admitting: Nurse Practitioner

## 2023-05-29 LAB — LIPID PANEL
Chol/HDL Ratio: 3.7 ratio (ref 0.0–4.4)
Cholesterol, Total: 145 mg/dL (ref 100–199)
HDL: 39 mg/dL — ABNORMAL LOW (ref >39)
LDL Chol Calc (NIH): 84 mg/dL (ref 0–99)
Triglycerides: 122 mg/dL (ref 0–149)
VLDL Cholesterol Cal: 22 mg/dL (ref 5–40)

## 2023-05-29 LAB — HEMOGLOBIN A1C
Est. average glucose Bld gHb Est-mCnc: 117 mg/dL
Hgb A1c MFr Bld: 5.7 % — ABNORMAL HIGH (ref 4.8–5.6)

## 2023-06-07 ENCOUNTER — Encounter (HOSPITAL_COMMUNITY): Payer: Self-pay | Admitting: *Deleted

## 2023-06-07 ENCOUNTER — Emergency Department (HOSPITAL_COMMUNITY): Payer: Self-pay

## 2023-06-07 ENCOUNTER — Emergency Department (HOSPITAL_COMMUNITY)
Admission: EM | Admit: 2023-06-07 | Discharge: 2023-06-07 | Disposition: A | Discharge status: Home / Self Care | Payer: Self-pay | Attending: Emergency Medicine | Admitting: Emergency Medicine

## 2023-06-07 ENCOUNTER — Other Ambulatory Visit: Payer: Self-pay

## 2023-06-07 DIAGNOSIS — E876 Hypokalemia: Secondary | ICD-10-CM | POA: Insufficient documentation

## 2023-06-07 DIAGNOSIS — J020 Streptococcal pharyngitis: Secondary | ICD-10-CM | POA: Insufficient documentation

## 2023-06-07 LAB — RESP PANEL BY RT-PCR (RSV, FLU A&B, COVID)  RVPGX2
Influenza A by PCR: NEGATIVE
Influenza B by PCR: NEGATIVE
Resp Syncytial Virus by PCR: NEGATIVE
SARS Coronavirus 2 by RT PCR: NEGATIVE

## 2023-06-07 LAB — CBC
HCT: 39.1 % (ref 36.0–46.0)
Hemoglobin: 12.8 g/dL (ref 12.0–15.0)
MCH: 28.6 pg (ref 26.0–34.0)
MCHC: 32.7 g/dL (ref 30.0–36.0)
MCV: 87.5 fL (ref 80.0–100.0)
Platelets: 305 10*3/uL (ref 150–400)
RBC: 4.47 MIL/uL (ref 3.87–5.11)
RDW: 13.2 % (ref 11.5–15.5)
WBC: 9.4 10*3/uL (ref 4.0–10.5)
nRBC: 0 % (ref 0.0–0.2)

## 2023-06-07 LAB — COMPREHENSIVE METABOLIC PANEL WITH GFR
ALT: 31 U/L (ref 0–44)
AST: 21 U/L (ref 15–41)
Albumin: 3.9 g/dL (ref 3.5–5.0)
Alkaline Phosphatase: 52 U/L (ref 38–126)
Anion gap: 10 (ref 5–15)
BUN: 15 mg/dL (ref 6–20)
CO2: 25 mmol/L (ref 22–32)
Calcium: 9.6 mg/dL (ref 8.9–10.3)
Chloride: 101 mmol/L (ref 98–111)
Creatinine, Ser: 0.79 mg/dL (ref 0.44–1.00)
GFR, Estimated: >60 mL/min (ref >60)
Glucose, Bld: 104 mg/dL — ABNORMAL HIGH (ref 70–99)
Potassium: 3.1 mmol/L — ABNORMAL LOW (ref 3.5–5.1)
Sodium: 136 mmol/L (ref 135–145)
Total Bilirubin: 1 mg/dL (ref 0.0–1.2)
Total Protein: 7 g/dL (ref 6.5–8.1)

## 2023-06-07 LAB — URINALYSIS, ROUTINE W REFLEX MICROSCOPIC
Bilirubin Urine: NEGATIVE
Glucose, UA: NEGATIVE mg/dL
Hgb urine dipstick: NEGATIVE
Ketones, ur: NEGATIVE mg/dL
Leukocytes,Ua: NEGATIVE
Nitrite: NEGATIVE
Protein, ur: NEGATIVE mg/dL
Specific Gravity, Urine: 1.027 (ref 1.005–1.030)
pH: 6 (ref 5.0–8.0)

## 2023-06-07 LAB — HCG, SERUM, QUALITATIVE: Preg, Serum: NEGATIVE

## 2023-06-07 LAB — LIPASE, BLOOD: Lipase: 31 U/L (ref 11–51)

## 2023-06-07 LAB — GROUP A STREP BY PCR: Group A Strep by PCR: DETECTED — AB

## 2023-06-07 MED ORDER — CEPHALEXIN 250 MG PO CAPS
1000.0000 mg | ORAL_CAPSULE | Freq: Once | ORAL | Status: AC
  Administered 2023-06-07: 1000 mg via ORAL
  Filled 2023-06-07: qty 4

## 2023-06-07 MED ORDER — KETOROLAC TROMETHAMINE 60 MG/2ML IM SOLN
30.0000 mg | Freq: Once | INTRAMUSCULAR | Status: AC
  Administered 2023-06-07: 30 mg via INTRAMUSCULAR
  Filled 2023-06-07: qty 2

## 2023-06-07 MED ORDER — DEXAMETHASONE 4 MG PO TABS
10.0000 mg | ORAL_TABLET | Freq: Once | ORAL | Status: AC
  Administered 2023-06-07: 10 mg via ORAL
  Filled 2023-06-07: qty 3

## 2023-06-07 MED ORDER — CEPHALEXIN 500 MG PO CAPS: 500.0000 mg | ORAL_CAPSULE | Freq: Four times a day (QID) | ORAL | 0 refills | Status: AC

## 2023-06-07 MED ORDER — POTASSIUM CHLORIDE CRYS ER 20 MEQ PO TBCR
40.0000 meq | EXTENDED_RELEASE_TABLET | Freq: Once | ORAL | Status: AC
  Administered 2023-06-07: 40 meq via ORAL
  Filled 2023-06-07: qty 2

## 2023-06-07 MED ORDER — POTASSIUM CHLORIDE CRYS ER 20 MEQ PO TBCR: 40.0000 meq | EXTENDED_RELEASE_TABLET | Freq: Every day | ORAL | 0 refills | Status: AC

## 2023-06-07 NOTE — ED Provider Notes (Signed)
 [place] EMERGENCY DEPARTMENT AT [facility] Provider Note   CSN: [id number] Arrival date & time: 06/07/23  0155     History  No chief complaint on file.   Colon Dear Mayble Hahm is a 28 y.o. female.  28 yo F here with multiple symptoms. Sore throat, muscle aches, left chest pleuritic pain. Tylenol  helped some but not completely. No fevers. Mild cough, no productive cough.         Home Medications Prior to Admission medications   Medication Sig Start Date End Date Taking? Authorizing Provider  acetaminophen  (TYLENOL ) 500 MG tablet Take 1 tablet (500 mg total) by mouth every 6 (six) hours as needed. 05/27/23   Debbra Fairy, PA-C  cetirizine  (ZYRTEC ) 10 MG tablet Take 1 tablet (10 mg total) by mouth daily. 05/15/23   Senaida Dama, NP  famotidine  (PEPCID ) 20 MG tablet Take 1 tablet (20 mg total) by mouth 2 (two) times daily for 14 days. 05/09/23 05/23/23  [place] Bureau, PA-C  FLUoxetine  (PROZAC ) 20 MG tablet Take 1 tablet (20 mg total) by mouth daily. Anxiety and depression 05/28/23   Collins Dean, NP  hydrochlorothiazide  (HYDRODIURIL ) 25 MG tablet Take 1 tablet (25 mg total) by mouth daily. FOR BLOOD PRESSURE 05/28/23   Collins Dean, NP  lidocaine  (XYLOCAINE ) 2 % solution Use as directed 15 mLs in the mouth or throat as needed for mouth pain. 05/27/23   Debbra Fairy, PA-C  norgestimate -ethinyl estradiol  (ORTHO-CYCLEN) 0.25-35 MG-MCG tablet Take 1 tablet by mouth daily. Patient not taking: Reported on 05/18/2023 09/07/22   Vernell Goldsmith, MD  traZODone  (DESYREL ) 50 MG tablet Take 1 tablet (50 mg total) by mouth at bedtime as needed for sleep. 05/28/23   Fleming, Zelda W, NP  VITAMIN D  PO Take 1 Dose by mouth daily.    [provider]      Allergies    Patient has no known allergies.    Review of Systems   Review of Systems  Physical Exam Updated Vital Signs BP 103/66 (BP Location: Right Arm)   Pulse 73   Temp 98.9 F (37.2 C)   Resp 16   Ht 5'  3" (1.6 m)   Wt 114.2 kg   LMP 05/22/2023 (Approximate)   SpO2 100%   BMI 44.60 kg/m  Physical Exam Vitals and nursing note reviewed.  Constitutional:      Appearance: She is well-developed.  HENT:     Head: Normocephalic and atraumatic.     Mouth/Throat:     Comments: Mild post erythema, no asymmetric swelling Eyes:     Pupils: Pupils are equal, round, and reactive to light.  Cardiovascular:     Rate and Rhythm: Normal rate and regular rhythm.  Pulmonary:     Effort: No respiratory distress.     Breath sounds: No stridor.  Abdominal:     General: There is no distension.  Musculoskeletal:     Cervical back: Normal range of motion.  Skin:    General: Skin is warm and dry.     Coloration: Skin is not jaundiced or pale.  Neurological:     General: No focal deficit present.     Mental Status: She is alert.     ED Results / Procedures / Treatments   Labs (all labs ordered are listed, but only abnormal results are displayed) Labs Reviewed  GROUP A STREP BY PCR - Abnormal; Notable for the following components:      Result Value  Group A Strep by PCR DETECTED (*)    All other components within normal limits  COMPREHENSIVE METABOLIC PANEL WITH GFR - Abnormal; Notable for the following components:   Potassium 3.1 (*)    Glucose, Bld 104 (*)    All other components within normal limits  URINALYSIS, ROUTINE W REFLEX MICROSCOPIC - Abnormal; Notable for the following components:   APPearance HAZY (*)    All other components within normal limits  RESP PANEL BY RT-PCR (RSV, FLU A&B, COVID)  RVPGX2  LIPASE, BLOOD  CBC  HCG, SERUM, QUALITATIVE    EKG None  Radiology DG Chest 2 View Result Date: 06/07/2023 CLINICAL DATA:  Chest pain EXAM: CHEST - 2 VIEW COMPARISON:  None Available. FINDINGS: The heart size and mediastinal contours are within normal limits. Both lungs are clear. The visualized skeletal structures are unremarkable. IMPRESSION: No active cardiopulmonary  disease. Electronically Signed   By: Worthy Heads M.D.   On: 06/07/2023 02:55    Procedures Procedures    Medications Ordered in ED Medications  cephALEXin  (KEFLEX ) capsule 1,000 mg (has no administration in time range)  dexamethasone  (DECADRON ) tablet 10 mg (has no administration in time range)  potassium chloride  SA (KLOR-CON  M) CR tablet 40 mEq (40 mEq Oral Given 06/07/23 0538)  ketorolac  (TORADOL ) injection 30 mg (30 mg Intramuscular Given 06/07/23 1610)    ED Course/ Medical Decision Making/ A&P                                 Medical Decision Making Amount and/or Complexity of Data Reviewed Labs: ordered. Radiology: ordered.  Risk Prescription drug management.   Hypokalemia and persistent strep pharyngitis vs colonization, did pcn last time, will try keflex  this time. K started on repletion, will need further repletion at home as it is likely contributing. Fu in a week to ensure K is improving. Low suspicion for PE or cardiac causes. Patient sleeping comfortably in between reevaluations, speaking normally, tolerating secretions with normal vital signs and no e/o RPA or PTA.   Final Clinical Impression(s) / ED Diagnoses Final diagnoses:  None    Rx / DC Orders ED Discharge Orders     None         Joh Rao, Reymundo Caulk, MD 06/07/23 [phone]

## 2023-06-07 NOTE — ED Triage Notes (Signed)
 The pt is c/o abd  and chest pain throat pain  all for 3 days lmp  march  laughing and joking with the person in the room as I walked in

## 2023-06-11 ENCOUNTER — Encounter: Payer: Self-pay | Admitting: Neurology

## 2023-06-11 ENCOUNTER — Ambulatory Visit: Payer: Self-pay | Admitting: Neurology

## 2023-06-11 VITALS — BP 130/79 | HR 87 | Ht 63.0 in | Wt 252.0 lb

## 2023-06-11 DIAGNOSIS — R519 Headache, unspecified: Secondary | ICD-10-CM

## 2023-06-11 DIAGNOSIS — G4719 Other hypersomnia: Secondary | ICD-10-CM

## 2023-06-11 DIAGNOSIS — R0683 Snoring: Secondary | ICD-10-CM

## 2023-06-11 DIAGNOSIS — Z9189 Other specified personal risk factors, not elsewhere classified: Secondary | ICD-10-CM

## 2023-06-11 DIAGNOSIS — G4700 Insomnia, unspecified: Secondary | ICD-10-CM

## 2023-06-11 NOTE — Patient Instructions (Signed)

## 2023-06-11 NOTE — Progress Notes (Signed)
 Subjective:    Patient ID: Sharon Hess is a 28 y.o. female.  HPI    Sharon Fairy, MD, PhD [facility] [address], [address] [address] [place], [place] [address]   Dear Miquel Amen,  I saw your patient, Sharon Hess, upon your kind request in my sleep clinic today for initial consultation of her sleep disorder, in particular, concern for underlying obstructive sleep apnea.  The patient is accompanied by a [place] interpreter, [place], today.  As you know, Sharon Hess is a 28 year old female with an underlying medical history of anemia, fatty liver, hepatitis B, history of preeclampsia, and severe obesity with a BMI of over 40, who reports snoring and difficulty sleeping at night.  She has had difficulty falling asleep and staying asleep for the past 3 to 4 months.  She has been on trazodone  for the past month, it helps a little.  She takes 50 mg at bedtime.  She is not aware of any family history of sleep apnea.  Her Epworth sleepiness score is 6 out of 24, fatigue severity score is 28 out of 63.  She goes to bed around 9 but is not asleep until later, rise time may be somewhere between 2 and 3 AM.  She denies nightly nocturia but has had occasional morning headaches.  They do not have any pets in the household, she lives with her significant other and her children.  She has a TV in the bedroom but it is typically not on at night.  She drinks no daily caffeine, no alcohol, she is a non-smoker.  She does not work.   I reviewed your office note from 05/18/2023.  Of note, she presented to the emergency room at [facility] on 05/12/2023 with insomnia.  She reported chest pain and dyspnea as well as full body tingling and headache as well.  I reviewed the emergency room records.  Blood work showed negative pregnancy test, CK level was mildly elevated at 273.  BNP was normal.  Of note, she had a brain MRI with and without contrast as well as orbital MRI with  and without contrast on 05/27/2023 and I reviewed the results impression: Normal MRI of the brain and orbits. Her Past Medical History Is Significant For: Past Medical History:  Diagnosis Date   Anemia    Bilateral impacted cerumen 07/07/2021   Fatty liver    Hepatitis B    Preeclampsia 2021   Previous cesarean delivery, antepartum condition or complication 02/16/2014   OP arrest at 7 cm--desires TOLAC--consent signed     Her Past Surgical History Is Significant For: Past Surgical History:  Procedure Laterality Date   CESAREAN SECTION  08/21/2011   Procedure: CESAREAN SECTION;  Surgeon: Ana Balling, MD;  Location: [facility];  Service: Gynecology;  Laterality: N/A;  Primary cesarean section of baby girl  at 73  APGAR 9/9    Her Family History Is Significant For: Family History  Problem Relation Age of Onset   Alcohol abuse Neg Hx    Arthritis Neg Hx    Asthma Neg Hx    Birth defects Neg Hx    Cancer Neg Hx    COPD Neg Hx    Depression Neg Hx    Diabetes Neg Hx    Drug abuse Neg Hx    Early death Neg Hx    Hearing loss Neg Hx    Heart disease Neg Hx    Hyperlipidemia Neg Hx  Hypertension Neg Hx    Kidney disease Neg Hx    Learning disabilities Neg Hx    Mental illness Neg Hx    Mental retardation Neg Hx    Miscarriages / Stillbirths Neg Hx    Stroke Neg Hx    Vision loss Neg Hx    Migraines Neg Hx    Sleep apnea Neg Hx     Her Social History Is Significant For: Social History   Socioeconomic History   Marital status: Significant Other    Spouse name: Not on file   Number of children: Not on file   Years of education: Not on file   Highest education level: Not on file  Occupational History   Not on file  Tobacco Use   Smoking status: Never   Smokeless tobacco: Never  Vaping Use   Vaping status: Never Used  Substance and Sexual Activity   Alcohol use: No   Drug use: No   Sexual activity: Yes    Birth control/protection: None  Other Topics Concern    Not on file  Social History Narrative   Right handed   Caffeine: maybe 2-3 times a week   Lives with partner and children         ** Merged History Encounter **    Pt not working    Social Drivers of Corporate investment banker Strain: Not on file  Food Insecurity: No Food Insecurity (01/01/2023)   Hunger Vital Sign    Worried About Running Out of Food in the Last Year: Never true    Ran Out of Food in the Last Year: Never true  Transportation Needs: No Transportation Needs (01/01/2023)   PRAPARE - Administrator, Civil Service (Medical): No    Lack of Transportation (Non-Medical): No  Physical Activity: Not on file  Stress: Not on file  Social Connections: Not on file    Her Allergies Are:  No Known Allergies:   Her Current Medications Are:  Outpatient Encounter Medications as of 06/11/2023  Medication Sig   acetaminophen  (TYLENOL ) 500 MG tablet Take 1 tablet (500 mg total) by mouth every 6 (six) hours as needed.   cephALEXin  (KEFLEX ) 500 MG capsule Take 1 capsule (500 mg total) by mouth 4 (four) times daily.   cetirizine  (ZYRTEC ) 10 MG tablet Take 1 tablet (10 mg total) by mouth daily.   FLUoxetine  (PROZAC ) 20 MG tablet Take 1 tablet (20 mg total) by mouth daily. Anxiety and depression   hydrochlorothiazide  (HYDRODIURIL ) 25 MG tablet Take 1 tablet (25 mg total) by mouth daily. FOR BLOOD PRESSURE   lidocaine  (XYLOCAINE ) 2 % solution Use as directed 15 mLs in the mouth or throat as needed for mouth pain.   norgestimate -ethinyl estradiol  (ORTHO-CYCLEN) 0.25-35 MG-MCG tablet Take 1 tablet by mouth daily.   potassium chloride  SA (KLOR-CON  M) 20 MEQ tablet Take 2 tablets (40 mEq total) by mouth daily for 10 days.   traZODone  (DESYREL ) 50 MG tablet Take 1 tablet (50 mg total) by mouth at bedtime as needed for sleep.   VITAMIN D  PO Take 1 Dose by mouth daily.   famotidine  (PEPCID ) 20 MG tablet Take 1 tablet (20 mg total) by mouth 2 (two) times daily for 14 days.   No  facility-administered encounter medications on file as of 06/11/2023.  :   Review of Systems:  Out of a complete 14 point review of systems, all are reviewed and negative with the exception of these symptoms as  listed below:   Review of Systems  Neurological:        Pt here for sleep study consult Pt has headaches,fatigue, Pt denies sleep study,cpap machine,snoring,hypertension   ESS: FSS:    Objective:  Neurological Exam  Physical Exam Physical Examination:   Vitals:   06/11/23 1424  BP: 130/79  Pulse: 87    General Examination: The patient is a very pleasant 28 y.o. female in no acute distress. She appears well-developed and well-nourished and well groomed.   HEENT: Normocephalic, atraumatic, pupils are equal, round and reactive to light, extraocular tracking is good without limitation to gaze excursion or nystagmus noted. Hearing is grossly intact. Face is symmetric with normal facial animation. Speech is clear with no dysarthria noted. There is no hypophonia. There is no lip, neck/head, jaw or voice tremor. Neck is supple with full range of passive and active motion. There are no carotid bruits on auscultation. Oropharynx exam reveals: mild mouth dryness, adequate dental hygiene and moderate airway crowding, due to thicker soft palate, prominent uvula, tonsillar size 2-3+ on the right, 1-2+ on the left, Mallampati class III.  Neck circumference 18 5/8 inches, minimal overbite noted.  Tongue protrudes centrally and palate elevates symmetrically.  Chest: Clear to auscultation without wheezing, rhonchi or crackles noted.  Heart: S1+S2+0, regular and normal without murmurs, rubs or gallops noted.   Abdomen: Soft, non-tender and non-distended.  Extremities: There is no pitting edema in the distal lower extremities bilaterally.   Skin: Warm and dry without trophic changes noted.   Musculoskeletal: exam reveals no obvious joint deformities.   Neurologically:  Mental status:  The patient is awake, alert and oriented in all 4 spheres. Her immediate and remote memory, attention, language skills and fund of knowledge are appropriate. There is no evidence of aphasia, agnosia, apraxia or anomia. Speech is clear with normal prosody and enunciation. Thought process is linear. Mood is normal and affect is normal.  Cranial nerves II - XII are as described above under HEENT exam.  Motor exam: Normal bulk, strength and tone is noted. There is no obvious action or resting tremor.  Fine motor skills and coordination: grossly intact.  Cerebellar testing: No dysmetria or intention tremor. There is no truncal or gait ataxia.  Sensory exam: intact to light touch in the upper and lower extremities.  Gait, station and balance: She stands easily. No veering to one side is noted. No leaning to one side is noted. Posture is age-appropriate and stance is narrow based. Gait shows normal stride length and normal pace. No problems turning are noted.   Assessment and Plan:  In summary, Sharon Hess is a very pleasant 28 y.o.-year old female with an underlying medical history of anemia, fatty liver, hepatitis B, history of preeclampsia, and severe obesity with a BMI of over 40, whose history and physical exam are concerning for sleep disordered breathing, particularly obstructive sleep apnea (OSA). A laboratory attended sleep study is typically considered "gold standard" for evaluation of sleep disordered breathing.   I had a long chat with the patient about my findings and the diagnosis of sleep apnea, particularly OSA, its prognosis and treatment options. We talked about medical/conservative treatments, surgical interventions and non-pharmacological approaches for symptom control. I explained, in particular, the risks and ramifications of untreated moderate to severe OSA, especially with respect to developing cardiovascular disease down the road, including congestive heart failure (CHF),  difficult to treat hypertension, cardiac arrhythmias (particularly A-fib), neurovascular complications including TIA, stroke and  dementia. Even type 2 diabetes has, in part, been linked to untreated OSA. Symptoms of untreated OSA may include (but may not be limited to) daytime sleepiness, nocturia (i.e. frequent nighttime urination), memory problems, mood irritability and suboptimally controlled or worsening mood disorder such as depression and/or anxiety, lack of energy, lack of motivation, physical discomfort, as well as recurrent headaches, especially morning or nocturnal headaches. We talked about the importance of maintaining a healthy lifestyle and striving for healthy weight. I recommended a sleep study at this time. I outlined the differences between a laboratory attended sleep study which is considered more comprehensive and accurate over the option of a home sleep test (HST); the latter may lead to underestimation of sleep disordered breathing in some instances and does not help with diagnosing upper airway resistance syndrome and is not accurate enough to diagnose primary central sleep apnea typically. I outlined possible surgical and non-surgical treatment options of OSA, including the use of a positive airway pressure (PAP) device (i.e. CPAP, AutoPAP/APAP or BiPAP in certain circumstances), a custom-made dental device (aka oral appliance, which would require a referral to a specialist dentist or orthodontist typically, and is generally speaking not considered for patients with full dentures or edentulous state), upper airway surgical options, such as traditional UPPP (which is not considered a first-line treatment) or the Inspire device (hypoglossal nerve stimulator, which would involve a referral for consultation with an ENT surgeon, after careful selection, following inclusion criteria - also not first-line treatment). I explained the PAP treatment option to the patient in detail, as this is  generally considered first-line treatment.  The patient indicated that she would be willing to try PAP therapy, if the need arises. I explained the importance of being compliant with PAP treatment, not only for insurance purposes but primarily to improve patient's symptoms symptoms, and for the patient's long term health benefit, including to reduce Her cardiovascular risks longer-term.    We will pick up our discussion about the next steps and treatment options after testing.  We will keep her posted as to the test results by phone call and/or MyChart messaging where possible.  We will plan to follow-up in sleep clinic accordingly as well.  I answered all her questions today and the patient was in agreement.   I encouraged her to call with any interim questions, concerns, problems or updates or email us  through MyChart.  Generally speaking, sleep test authorizations may take up to 2 weeks, sometimes less, sometimes longer, the patient is encouraged to get in touch with us  if they do not hear back from the sleep lab staff directly within the next 2 weeks.  Thank you very much for allowing me to participate in the care of this nice patient. If I can be of any further assistance to you please do not hesitate to talk to me.  Sincerely,   Sharon Fairy, MD, PhD

## 2023-06-21 ENCOUNTER — Other Ambulatory Visit: Payer: Self-pay

## 2023-07-03 ENCOUNTER — Telehealth: Payer: Self-pay | Admitting: Neurology

## 2023-07-03 NOTE — Telephone Encounter (Signed)
 Sent mychart to see if she has insurance or if she is self pay.

## 2023-08-25 ENCOUNTER — Emergency Department (HOSPITAL_COMMUNITY)
Admission: EM | Admit: 2023-08-25 | Discharge: 2023-08-25 | Disposition: A | Discharge status: Home / Self Care | Payer: Self-pay | Attending: Emergency Medicine | Admitting: Emergency Medicine

## 2023-08-25 ENCOUNTER — Encounter (HOSPITAL_COMMUNITY): Payer: Self-pay

## 2023-08-25 ENCOUNTER — Emergency Department (HOSPITAL_COMMUNITY): Payer: Self-pay

## 2023-08-25 ENCOUNTER — Other Ambulatory Visit: Payer: Self-pay

## 2023-08-25 DIAGNOSIS — R0789 Other chest pain: Secondary | ICD-10-CM | POA: Insufficient documentation

## 2023-08-25 DIAGNOSIS — H60503 Unspecified acute noninfective otitis externa, bilateral: Secondary | ICD-10-CM

## 2023-08-25 DIAGNOSIS — R079 Chest pain, unspecified: Secondary | ICD-10-CM

## 2023-08-25 DIAGNOSIS — H60533 Acute contact otitis externa, bilateral: Secondary | ICD-10-CM | POA: Insufficient documentation

## 2023-08-25 DIAGNOSIS — R059 Cough, unspecified: Secondary | ICD-10-CM | POA: Insufficient documentation

## 2023-08-25 DIAGNOSIS — R0602 Shortness of breath: Secondary | ICD-10-CM | POA: Insufficient documentation

## 2023-08-25 LAB — CBC
HCT: 40.9 % (ref 36.0–46.0)
Hemoglobin: 13.4 g/dL (ref 12.0–15.0)
MCH: 28.7 pg (ref 26.0–34.0)
MCHC: 32.8 g/dL (ref 30.0–36.0)
MCV: 87.6 fL (ref 80.0–100.0)
Platelets: 307 K/uL (ref 150–400)
RBC: 4.67 MIL/uL (ref 3.87–5.11)
RDW: 13.6 % (ref 11.5–15.5)
WBC: 7.3 K/uL (ref 4.0–10.5)
nRBC: 0 % (ref 0.0–0.2)

## 2023-08-25 LAB — BASIC METABOLIC PANEL WITH GFR
Anion gap: 9 (ref 5–15)
BUN: 11 mg/dL (ref 6–20)
CO2: 25 mmol/L (ref 22–32)
Calcium: 9.2 mg/dL (ref 8.9–10.3)
Chloride: 105 mmol/L (ref 98–111)
Creatinine, Ser: 0.58 mg/dL (ref 0.44–1.00)
GFR, Estimated: >60 mL/min (ref >60)
Glucose, Bld: 109 mg/dL — ABNORMAL HIGH (ref 70–99)
Potassium: 3.4 mmol/L — ABNORMAL LOW (ref 3.5–5.1)
Sodium: 139 mmol/L (ref 135–145)

## 2023-08-25 LAB — TROPONIN I (HIGH SENSITIVITY)
Troponin I (High Sensitivity): 6 ng/L (ref <18)
Troponin I (High Sensitivity): 4 ng/L (ref <18)

## 2023-08-25 LAB — RESP PANEL BY RT-PCR (RSV, FLU A&B, COVID)  RVPGX2
Influenza A by PCR: NEGATIVE
Influenza B by PCR: NEGATIVE
Resp Syncytial Virus by PCR: NEGATIVE
SARS Coronavirus 2 by RT PCR: NEGATIVE

## 2023-08-25 LAB — HCG, SERUM, QUALITATIVE: Preg, Serum: NEGATIVE

## 2023-08-25 LAB — GROUP A STREP BY PCR: Group A Strep by PCR: NOT DETECTED

## 2023-08-25 MED ORDER — CYCLOBENZAPRINE HCL 10 MG PO TABS
10.0000 mg | ORAL_TABLET | Freq: Two times a day (BID) | ORAL | 0 refills | Status: AC | PRN
Start: 2023-08-25 — End: ?

## 2023-08-25 MED ORDER — LIDOCAINE 5 % EX PTCH: 1.0000 | MEDICATED_PATCH | CUTANEOUS | 0 refills | Status: AC

## 2023-08-25 MED ORDER — OFLOXACIN 0.3 % OT SOLN: 10.0000 [drp] | Freq: Every day | OTIC | 0 refills | Status: AC

## 2023-08-25 NOTE — Discharge Instructions (Signed)
 Your history, exam, workup today showed evidence of infection in your ear canal causing the ear pain.  I suspect you also may have a viral infection causing some of your other congestion symptoms and your workup for your heart was reassuring and negative.  Your chest x-ray did not show pneumonia or other acute chest abnormality and with your previous negative workups for your chest pain we feel you are now safe for discharge home.  You did have some muscle spasm and tenderness to please use the muscle relaxant and numbing patches.  Please rest and stay hydrated and follow-up with your primary doctor.  If any symptoms change or worsen acutely, please return to the nearest emergency department.

## 2023-08-25 NOTE — ED Provider Notes (Signed)
 [place] [facility] Provider Note   CSN: [id number] Arrival date & time: 08/25/23  9196     Patient presents with: Chest Pain   [facility] Sharon Hess is a 28 y.o. female.   The history is provided by the patient and medical records. The history is limited by a language barrier. A language interpreter was used.  Chest Pain Pain location:  L chest Pain quality: aching   Pain radiates to:  Does not radiate Pain severity:  Moderate Onset quality:  Gradual Duration:  1 day Timing:  Sporadic Progression:  Waxing and waning Chronicity:  Recurrent Relieved by:  Nothing Worsened by:  Nothing Ineffective treatments:  None tried Associated symptoms: cough   Associated symptoms: no abdominal pain, no altered mental status, no back pain, no fatigue, no fever, no headache, no lower extremity edema, no nausea, no palpitations, no shortness of breath, no vomiting and no weakness        Prior to Admission medications   Medication Sig Start Date End Date Taking? Authorizing Provider  acetaminophen  (TYLENOL ) 500 MG tablet Take 1 tablet (500 mg total) by mouth every 6 (six) hours as needed. 05/27/23   Nivia Colon, PA-C  cephALEXin  (KEFLEX ) 500 MG capsule Take 1 capsule (500 mg total) by mouth 4 (four) times daily. 06/07/23   Mesner, Selinda, MD  cetirizine  (ZYRTEC ) 10 MG tablet Take 1 tablet (10 mg total) by mouth daily. 05/15/23   Lorren Greig PARAS, NP  famotidine  (PEPCID ) 20 MG tablet Take 1 tablet (20 mg total) by mouth 2 (two) times daily for 14 days. 05/09/23 05/23/23  Hoy Nidia FALCON, PA-C  FLUoxetine  (PROZAC ) 20 MG tablet Take 1 tablet (20 mg total) by mouth daily. Anxiety and depression 05/28/23   Theotis Haze ORN, NP  hydrochlorothiazide  (HYDRODIURIL ) 25 MG tablet Take 1 tablet (25 mg total) by mouth daily. FOR BLOOD PRESSURE 05/28/23   Theotis Haze ORN, NP  lidocaine  (XYLOCAINE ) 2 % solution Use as directed 15 mLs in the mouth or throat as needed for  mouth pain. 05/27/23   Nivia Colon, PA-C  norgestimate -ethinyl estradiol  (ORTHO-CYCLEN) 0.25-35 MG-MCG tablet Take 1 tablet by mouth daily. 09/07/22   Brien Belvie BRAVO, MD  potassium chloride  SA (KLOR-CON  M) 20 MEQ tablet Take 2 tablets (40 mEq total) by mouth daily for 10 days. 06/07/23 06/17/23  Mesner, Selinda, MD  traZODone  (DESYREL ) 50 MG tablet Take 1 tablet (50 mg total) by mouth at bedtime as needed for sleep. 05/28/23   Fleming, Zelda W, NP  VITAMIN D  PO Take 1 Dose by mouth daily.    [provider]    Allergies: Patient has no known allergies.    Review of Systems  Constitutional:  Negative for chills, fatigue and fever.  HENT:  Positive for ear discharge, ear pain and sore throat.   Eyes:  Negative for visual disturbance.  Respiratory:  Positive for cough. Negative for chest tightness, shortness of breath and wheezing.   Cardiovascular:  Positive for chest pain. Negative for palpitations and leg swelling.  Gastrointestinal:  Negative for abdominal pain, constipation, diarrhea, nausea and vomiting.  Genitourinary:  Negative for dysuria, flank pain and frequency.  Musculoskeletal:  Negative for back pain.  Skin:  Negative for rash and wound.  Neurological:  Negative for weakness, light-headedness and headaches.  Psychiatric/Behavioral:  Negative for agitation.   All other systems reviewed and are negative.   Updated Vital Signs BP 131/84 (BP Location: Left Arm)   Pulse  84   Temp 98.6 F (37 C) (Oral)   Resp 18   SpO2 100%   Physical Exam Vitals and nursing note reviewed.  Constitutional:      General: She is not in acute distress.    Appearance: She is well-developed. She is not ill-appearing, toxic-appearing or diaphoretic.  HENT:     Head: Atraumatic.     Right Ear: Tenderness present.     Left Ear: Tenderness present.     Ears:     Comments: Left worse than right both years show does externa with erythema    Nose: Congestion present.     Mouth/Throat:      Mouth: Mucous membranes are moist.     Pharynx: Posterior oropharyngeal erythema present. No oropharyngeal exudate.  Eyes:     Extraocular Movements: Extraocular movements intact.     Conjunctiva/sclera: Conjunctivae normal.     Pupils: Pupils are equal, round, and reactive to light.  Neck:     Vascular: No carotid bruit.  Cardiovascular:     Rate and Rhythm: Normal rate and regular rhythm.     Heart sounds: No murmur heard. Pulmonary:     Effort: Pulmonary effort is normal. No respiratory distress.     Breath sounds: Normal breath sounds. No wheezing, rhonchi or rales.  Chest:     Chest wall: Tenderness present.  Abdominal:     Palpations: Abdomen is soft.     Tenderness: There is no abdominal tenderness. There is no guarding or rebound.  Musculoskeletal:        General: No swelling.     Cervical back: Neck supple. No tenderness.     Right lower leg: No edema.     Left lower leg: No edema.  Skin:    General: Skin is warm and dry.     Capillary Refill: Capillary refill takes less than 2 seconds.     Findings: No erythema or rash.  Neurological:     Mental Status: She is alert.     Sensory: No sensory deficit.     Motor: No weakness.  Psychiatric:        Mood and Affect: Mood normal.     (all labs ordered are listed, but only abnormal results are displayed) Labs Reviewed  BASIC METABOLIC PANEL WITH GFR - Abnormal; Notable for the following components:      Result Value   Potassium 3.4 (*)    Glucose, Bld 109 (*)    All other components within normal limits  GROUP A STREP BY PCR  RESP PANEL BY RT-PCR (RSV, FLU A&B, COVID)  [id number]  CBC  HCG, SERUM, QUALITATIVE  TROPONIN I (HIGH SENSITIVITY)  TROPONIN I (HIGH SENSITIVITY)    EKG: EKG Interpretation Date/Time:  Saturday August 25 2023 08:11:44 EDT Ventricular Rate:  83 PR Interval:  156 QRS Duration:  86 QT Interval:  372 QTC Calculation: 437 R Axis:   22  Text Interpretation: Normal sinus rhythm Normal ECG  When compared with ECG of 15-May-2023 02:57, PREVIOUS ECG IS PRESENT when compared to prior, similar appearance No STEMI Confirmed by Ginger Barefoot ([id number]) on 08/25/2023 8:20:03 AM  Radiology: DG Chest 2 View Result Date: 08/25/2023 EXAM: 2 VIEW(S) XRAY OF THE CHEST 08/25/2023 08:47:00 AM COMPARISON: 06/07/2023. Similar low lung volumes with slight asymmetric elevation of the right hemidiaphragm. CLINICAL HISTORY: Chest pain, shortness of breath. FINDINGS: LUNGS AND PLEURA: No focal pulmonary opacity. No pulmonary edema. No pleural effusion. No pneumothorax. Similar low lung volumes with slight  asymmetric elevation of the right hemidiaphragm. HEART AND MEDIASTINUM: No acute abnormality of the cardiac and mediastinal silhouettes. BONES AND SOFT TISSUES: No acute osseous abnormality. IMPRESSION: 1. No acute cardiopulmonary pathology. 2. Similar low lung volumes with slight asymmetric elevation of the right hemidiaphragm. Electronically signed by: Waddell Calk MD 08/25/2023 09:01 AM EDT RP Workstation: [id number]     Procedures   Medications Ordered in the ED - No data to display                                  Medical Decision Making Amount and/or Complexity of Data Reviewed Labs: ordered. Radiology: ordered.  Risk Prescription drug management.    Sharon Hess Kaytlin Burklow is a 28 y.o. female with a past medical history significant for PCOS, previous cesarean section, and previous strep throat and ear infections who presents with 5 days of sore throat and left ear infection as well as some chest discomfort and mild shortness of breath.  She reports also had some fatigue and thinks she could have another ear infection.  She denies any trauma.  She denies significant abdominal pain back pain or flank pain.  She reports occasional tingling in her low back where she feels like she is feeling ants but was told that a lot of her symptoms in her chest and back and abdomen were attributed to anxiety in  the past.  On my exam, lungs were clear.  Chest was tender to palpation reproducing her discomfort.  Abdomen nontender.  Back and flanks nontender.  Intact sensation strength and pulses in all extremities.  Normal finger-nose-finger testing.  Symmetric smile.  Speech was clear through interpreter was utilized for all conversation.  Pupils were symmetric and reactive with normal extract movements.  Patient does have evidence of some otitis externa bilaterally left worse than right but also had erythema in her posterior oropharynx.  Uvula is midline and not see evidence of PTA or RPA initially.  Will get a strep swab and will also swab her for COVID/flu/RSV given some of her congestion and URI symptoms as well.  She had some workup from triage including a CBC that was reassuring and is still waiting on the troponin and metabolic panel.  She is waiting on a pregnancy test and a viral swab.  Chest x-ray was reassuring and her EKG did not show STEMI.  I went through her chart and she has had about 4 visits for different chest pains including a negative D-dimer and otherwise reassuring workups.  Will hold on D-dimer at this time as she does not describe pleuritic pain she is not tachycardic and not tachypneic or hypoxic.  She will get the troponin, viral testing, strep swab, and we will reassess her.  If workup reassuring, dissipate musculoskeletal pain as well as some stress and anxiety symptoms in the setting of suspected viral URI and some otitis externa.  Anticipate treatment for ears and possibly her throat based on what her workup reveals.  Workup otherwise returned reassuring.  Her COVID/chest RSV test was negative and she is negative for strep.  She is not pregnant.  Troponin was negative and x-ray did not show pneumonia.  Patient otherwise well-appearing.  Patient agrees with plan for eardrops for the otitis externa on exam and will also give prescription for muscle box and Lidoderm  patches given the  chest wall tenderness.  Patient agrees with plan of care and follow-up instruction with  PCP and will be discharged for outpatient follow-up.      Final diagnoses:  Acute otitis externa of both ears, unspecified type  Chest pain, unspecified type    ED Discharge Orders          Ordered    ofloxacin  (FLOXIN ) 0.3 % OTIC solution  Daily        08/25/23 1336    cyclobenzaprine  (FLEXERIL ) 10 MG tablet  2 times daily PRN        08/25/23 1336    lidocaine  (LIDODERM ) 5 %  Every 24 hours        08/25/23 1336           Clinical Impression: 1. Acute otitis externa of both ears, unspecified type   2. Chest pain, unspecified type     Disposition: Discharge  Condition: Good  I have discussed the results, Dx and Tx plan with the pt(& family if present). He/she/they expressed understanding and agree(s) with the plan. Discharge instructions discussed at great length. Strict return precautions discussed and pt &/or family have verbalized understanding of the instructions. No further questions at time of discharge.    New Prescriptions   CYCLOBENZAPRINE  (FLEXERIL ) 10 MG TABLET    Take 1 tablet (10 mg total) by mouth 2 (two) times daily as needed for muscle spasms.   LIDOCAINE  (LIDODERM ) 5 %    Place 1 patch onto the skin daily. Remove & Discard patch within 12 hours or as directed by MD   OFLOXACIN  (FLOXIN ) 0.3 % OTIC SOLUTION    Place 10 drops into both ears daily for 10 days.    Follow Up: Brien Belvie BRAVO, MD [address] [place] [address] [phone]     [facility] Emergency Department at [facility] [address] [place]  [address] [phone]        Diani Jillson, Lonni PARAS, MD 08/25/23 1359

## 2023-08-25 NOTE — ED Triage Notes (Signed)
 Pt arrives via EMS from home. Spanish interpreter used: PT reports waking up this morning with chest pressure and slight sob. She reports she has had pain in her left ear for the past 5 days. PT is AxOx4. NAD.

## 2023-08-25 NOTE — ED Notes (Signed)
 Pt verbalized understanding of discharge instructions,

## 2023-09-11 ENCOUNTER — Encounter: Payer: Self-pay | Admitting: *Deleted

## 2023-09-18 ENCOUNTER — Ambulatory Visit (HOSPITAL_COMMUNITY): Payer: Self-pay | Admitting: Mental Health

## 2023-11-12 ENCOUNTER — Other Ambulatory Visit: Payer: Self-pay

## 2023-11-15 ENCOUNTER — Ambulatory Visit: Payer: Self-pay

## 2023-11-15 NOTE — Telephone Encounter (Signed)
 Patient was called to offer appointment no answer.

## 2023-11-15 NOTE — Telephone Encounter (Signed)
 FYI Only or Action Required?: FYI only for provider.  Patient was last seen in primary care on 05/28/2023 by Theotis Haze ORN, NP.  Called Nurse Triage reporting Dizziness.  Symptoms began several weeks ago.  Interventions attempted: Nothing.  Symptoms are: gradually worsening.  Triage Disposition: See Physician Within 24 Hours  Patient/caregiver understands and will follow disposition?: Yes No appts avail. Pt directed to go to UC or ED. Pt is agreeable to this plan.   Copied from CRM [id number]. Topic: Clinical - Red Word Triage >> Nov 15, 2023  2:06 PM Wess RAMAN wrote: Red Word that prompted transfer to Nurse Triage: Patient has been feeling dizzy for 2 weeks. Lots of headaches and lots of pain on back of neck. Pain on stomach and felt like she needed to vomit Reason for Disposition  [1] MODERATE dizziness (e.g., interferes with normal activities) AND [2] has NOT been evaluated by doctor (or NP/PA) for this  (Exception: Dizziness caused by heat exposure, sudden standing, or poor fluid intake.)  Answer Assessment - Initial Assessment Questions 1. DESCRIPTION: Describe your dizziness.     It feels weird like I am going to fall whenever I try to walk  2. LIGHTHEADED: Do you feel lightheaded? (e.g., somewhat faint, woozy, weak upon standing)     No  3. VERTIGO: Do you feel like either you or the room is spinning or tilting? (i.e., vertigo)     Feels like the room is spending  4. SEVERITY: How bad is it?  Do you feel like you are going to faint? Can you stand and walk?     Able to walk, but feels weak or shaking  5. ONSET:  When did the dizziness begin?     2 weeks  6. AGGRAVATING FACTORS: Does anything make it worse? (e.g., standing, change in head position)     No aggravating factors, feels dizzy when sitting, laying down, or standing  7. HEART RATE: Can you tell me your heart rate? How many beats in 15 seconds?  (Note: Not all patients can do this.)        *No Answer* 8. CAUSE: What do you think is causing the dizziness? (e.g., decreased fluids or food, diarrhea, emotional distress, heat exposure, new medicine, sudden standing, vomiting; unknown)     Unknown cause  9. RECURRENT SYMPTOM: Have you had dizziness before? If Yes, ask: When was the last time? What happened that time?     No  10. OTHER SYMPTOMS: Do you have any other symptoms? (e.g., fever, chest pain, vomiting, diarrhea, bleeding)       Buzzing sound in right ear and right sided headache; intermittent numbness on her back  11. PREGNANCY: Is there any chance you are pregnant? When was your last menstrual period?       More than 6 months ago  Protocols used: Dizziness - Lightheadedness-A-AH

## 2023-11-16 ENCOUNTER — Other Ambulatory Visit: Payer: Self-pay
